# Patient Record
Sex: Male | Born: 1970
Health system: Southern US, Community
[De-identification: ages and names within clinical notes are randomized; demographics above are authoritative.]

## PROBLEM LIST (undated history)

## (undated) DIAGNOSIS — J45909 Unspecified asthma, uncomplicated: Secondary | ICD-10-CM

## (undated) DIAGNOSIS — E785 Hyperlipidemia, unspecified: Secondary | ICD-10-CM

## (undated) DIAGNOSIS — G43909 Migraine, unspecified, not intractable, without status migrainosus: Secondary | ICD-10-CM

## (undated) DIAGNOSIS — M1990 Unspecified osteoarthritis, unspecified site: Secondary | ICD-10-CM

## (undated) DIAGNOSIS — K602 Anal fissure, unspecified: Secondary | ICD-10-CM

## (undated) DIAGNOSIS — D6949 Other primary thrombocytopenia: Secondary | ICD-10-CM

## (undated) DIAGNOSIS — E079 Disorder of thyroid, unspecified: Secondary | ICD-10-CM

## (undated) HISTORY — DX: Hyperlipidemia, unspecified: E78.5

## (undated) HISTORY — DX: Disorder of thyroid, unspecified: E07.9

## (undated) HISTORY — DX: Migraine, unspecified, not intractable, without status migrainosus: G43.909

## (undated) HISTORY — DX: Unspecified osteoarthritis, unspecified site: M19.90

## (undated) HISTORY — DX: Unspecified asthma, uncomplicated: J45.909

## (undated) HISTORY — DX: Other primary thrombocytopenia: D69.49

## (undated) HISTORY — DX: Anal fissure, unspecified: K60.2

## (undated) HISTORY — PX: COLONOSCOPY: SHX174

---

## 2005-01-02 ENCOUNTER — Emergency Department (HOSPITAL_COMMUNITY): Admission: EM | Admit: 2005-01-02 | Discharge: 2005-01-02 | Payer: Self-pay

## 2010-05-10 ENCOUNTER — Ambulatory Visit: Payer: Self-pay | Admitting: Family Medicine

## 2010-05-10 DIAGNOSIS — E039 Hypothyroidism, unspecified: Secondary | ICD-10-CM

## 2010-05-10 DIAGNOSIS — M542 Cervicalgia: Secondary | ICD-10-CM | POA: Insufficient documentation

## 2010-05-24 ENCOUNTER — Ambulatory Visit: Payer: Self-pay | Admitting: Family Medicine

## 2010-05-24 LAB — CONVERTED CEMR LAB
Nitrite: NEGATIVE
Urobilinogen, UA: 0.2

## 2010-05-25 LAB — CONVERTED CEMR LAB
ALT: 45 units/L (ref 0–53)
Albumin: 4.2 g/dL (ref 3.5–5.2)
Alkaline Phosphatase: 72 units/L (ref 39–117)
Basophils Relative: 0.6 % (ref 0.0–3.0)
CO2: 31 meq/L (ref 19–32)
Calcium: 9.8 mg/dL (ref 8.4–10.5)
Chloride: 106 meq/L (ref 96–112)
Eosinophils Absolute: 0.1 10*3/uL (ref 0.0–0.7)
Hemoglobin: 16.1 g/dL (ref 13.0–17.0)
Lymphocytes Relative: 40.9 % (ref 12.0–46.0)
MCHC: 32.7 g/dL (ref 30.0–36.0)
MCV: 85.1 fL (ref 78.0–100.0)
Neutro Abs: 2.8 10*3/uL (ref 1.4–7.7)
RBC: 5.79 M/uL (ref 4.22–5.81)
Sodium: 143 meq/L (ref 135–145)
Total CHOL/HDL Ratio: 4
Total Protein: 7.2 g/dL (ref 6.0–8.3)

## 2010-05-31 ENCOUNTER — Ambulatory Visit: Payer: Self-pay | Admitting: Family Medicine

## 2010-05-31 DIAGNOSIS — R740 Nonspecific elevation of levels of transaminase and lactic acid dehydrogenase [LDH]: Secondary | ICD-10-CM

## 2010-05-31 DIAGNOSIS — D696 Thrombocytopenia, unspecified: Secondary | ICD-10-CM

## 2010-05-31 LAB — CONVERTED CEMR LAB: Cholesterol, target level: 200 mg/dL

## 2010-06-25 ENCOUNTER — Ambulatory Visit: Payer: Self-pay | Admitting: Family Medicine

## 2010-06-26 LAB — CONVERTED CEMR LAB
ALT: 27 units/L (ref 0–53)
Albumin: 4 g/dL (ref 3.5–5.2)
Basophils Relative: 0.3 % (ref 0.0–3.0)
Eosinophils Absolute: 0.1 10*3/uL (ref 0.0–0.7)
HCT: 44 % (ref 39.0–52.0)
Hemoglobin: 14.9 g/dL (ref 13.0–17.0)
Lymphs Abs: 1.8 10*3/uL (ref 0.7–4.0)
MCHC: 33.8 g/dL (ref 30.0–36.0)
MCV: 84.6 fL (ref 78.0–100.0)
Monocytes Absolute: 0.4 10*3/uL (ref 0.1–1.0)
Neutro Abs: 1.8 10*3/uL (ref 1.4–7.7)
Neutrophils Relative %: 43.5 % (ref 43.0–77.0)
RBC: 5.21 M/uL (ref 4.22–5.81)
Total Protein: 6.8 g/dL (ref 6.0–8.3)

## 2010-06-27 ENCOUNTER — Ambulatory Visit: Payer: Self-pay | Admitting: Oncology

## 2010-07-03 ENCOUNTER — Ambulatory Visit (HOSPITAL_COMMUNITY): Admission: RE | Admit: 2010-07-03 | Discharge: 2010-07-03 | Payer: Self-pay | Admitting: Oncology

## 2010-07-03 ENCOUNTER — Encounter: Payer: Self-pay | Admitting: Family Medicine

## 2010-07-03 LAB — TECHNOLOGIST REVIEW

## 2010-07-03 LAB — CBC WITH DIFFERENTIAL/PLATELET
Basophils Absolute: 0 10*3/uL (ref 0.0–0.1)
EOS%: 1.6 % (ref 0.0–7.0)
HGB: 16.4 g/dL (ref 13.0–17.1)
MCH: 27.9 pg (ref 27.2–33.4)
MCV: 82.1 fL (ref 79.3–98.0)
MONO%: 7.1 % (ref 0.0–14.0)
NEUT#: 2 10*3/uL (ref 1.5–6.5)
RBC: 5.88 10*6/uL — ABNORMAL HIGH (ref 4.20–5.82)
RDW: 13.7 % (ref 11.0–14.6)
lymph#: 1.4 10*3/uL (ref 0.9–3.3)
nRBC: 0 % (ref 0–0)

## 2010-07-03 LAB — CHCC SMEAR

## 2010-07-04 LAB — COMPREHENSIVE METABOLIC PANEL
ALT: 30 U/L (ref 0–53)
AST: 24 U/L (ref 0–37)
Alkaline Phosphatase: 69 U/L (ref 39–117)
CO2: 19 mEq/L (ref 19–32)
Creatinine, Ser: 1.14 mg/dL (ref 0.40–1.50)
Sodium: 140 mEq/L (ref 135–145)
Total Bilirubin: 0.4 mg/dL (ref 0.3–1.2)
Total Protein: 7.3 g/dL (ref 6.0–8.3)

## 2010-07-04 LAB — LACTATE DEHYDROGENASE: LDH: 115 U/L (ref 94–250)

## 2010-07-30 ENCOUNTER — Ambulatory Visit: Payer: Self-pay | Admitting: Oncology

## 2010-08-01 ENCOUNTER — Encounter: Payer: Self-pay | Admitting: Family Medicine

## 2010-08-01 LAB — CBC WITH DIFFERENTIAL/PLATELET
BASO%: 0.4 % (ref 0.0–2.0)
Eosinophils Absolute: 0.1 10*3/uL (ref 0.0–0.5)
MCHC: 33.9 g/dL (ref 32.0–36.0)
MONO#: 0.4 10*3/uL (ref 0.1–0.9)
NEUT#: 2.1 10*3/uL (ref 1.5–6.5)
RBC: 5.56 10*6/uL (ref 4.20–5.82)
RDW: 13.8 % (ref 11.0–14.6)
WBC: 4.6 10*3/uL (ref 4.0–10.3)
nRBC: 0 % (ref 0–0)

## 2010-08-01 LAB — CHCC SMEAR

## 2010-11-02 ENCOUNTER — Ambulatory Visit: Payer: Self-pay | Admitting: Oncology

## 2010-11-06 ENCOUNTER — Encounter: Payer: Self-pay | Admitting: Family Medicine

## 2010-11-06 LAB — CHCC SMEAR

## 2010-11-06 LAB — CBC WITH DIFFERENTIAL/PLATELET
BASO%: 0.2 % (ref 0.0–2.0)
Basophils Absolute: 0 10*3/uL (ref 0.0–0.1)
Eosinophils Absolute: 0.1 10*3/uL (ref 0.0–0.5)
HCT: 45.9 % (ref 38.4–49.9)
HGB: 15.5 g/dL (ref 13.0–17.1)
LYMPH%: 39.7 % (ref 14.0–49.0)
MCHC: 33.8 g/dL (ref 32.0–36.0)
MONO#: 0.4 10*3/uL (ref 0.1–0.9)
NEUT#: 2 10*3/uL (ref 1.5–6.5)
NEUT%: 48.2 % (ref 39.0–75.0)
Platelets: 57 10*3/uL — ABNORMAL LOW (ref 140–400)
WBC: 4.1 10*3/uL (ref 4.0–10.3)
lymph#: 1.6 10*3/uL (ref 0.9–3.3)

## 2010-11-08 LAB — HEMOGLOBINOPATHY EVALUATION
Hgb A2 Quant: 2.8 % (ref 2.2–3.2)
Hgb F Quant: 0 % (ref 0.0–2.0)
Hgb S Quant: 0 % (ref 0.0–0.0)

## 2010-12-24 ENCOUNTER — Ambulatory Visit: Payer: Self-pay | Admitting: Oncology

## 2011-01-08 NOTE — Letter (Signed)
Summary: [facility]  [facility]   Imported By: Maryln Gottron 08/23/2010 14:57:21  _____________________________________________________________________  External Attachment:    Type:   Image     Comment:   External Document

## 2011-01-08 NOTE — Assessment & Plan Note (Signed)
Summary: to be est/njr/pt rsc/cjr   Vital Signs:  Patient profile:   39 year old male Height:      69 inches Weight:      171 pounds BMI:     25.34 Temp:     97.7 degrees F oral Pulse rate:   60 / minute Pulse rhythm:   regular Resp:     12 per minute BP sitting:   130 / 82  (left arm) Cuff size:   regular  Vitals Entered By: Sid Falcon LPN (May 10, 1609 10:43 AM)  Nutrition Counseling: Patient's BMI is greater than 25 and therefore counseled on weight management options. CC: New to establish, left neck pain   History of Present Illness: New patient to establish care.    patient is from [place] and has lived here in [place] for approximately 11 years. Questionable history of hypothyroidism 2006 patient treated temporarily but has not had any blood work since then. Anal fissure 2002. Family history sickle trait.  No prior surgeries. No medications. No known drug allergies. Last tetanus over 10 years ago.  Family history signif for father hypertension and diabetes type 2. Brother sister with asthma.  Acute issue of left neck pain 2 weeks. Described as sore or achy quality moderate severity. Worse with movement. No associated arm pain, weakness, or numbness. Topical helped somewhat. Taking no medications.  Preventive Screening-Counseling & Management  Alcohol-Tobacco     Smoking Status: never  Allergies (verified): No Known Drug Allergies  Past History:  Family History: Last updated: 05/10/2010 Father, hypertension, diabetes Type ll Mother, sickle trait Sister, diabetes type ll, asthma Brother, asthmaa  Social History: Last updated: 05/10/2010 Occupation:  Location manager Married Never Smoked Alcohol use-yes one son  Risk Factors: Smoking Status: never (05/10/2010)  Past Medical History: Blood in stool Migraines HIV testing Thyroid problem PMH-FH-SH reviewed for relevance  Family History: Father, hypertension, diabetes Type ll Mother,  sickle trait Sister, diabetes type ll, asthma Brother, asthmaa  Social History: Occupation:  Location manager Married Never Smoked Alcohol use-yes one son Smoking Status:  never Occupation:  employed  Review of Systems  The patient denies anorexia, fever, weight loss, weight gain, vision loss, decreased hearing, chest pain, syncope, dyspnea on exertion, peripheral edema, prolonged cough, headaches, hemoptysis, abdominal pain, melena, hematochezia, severe indigestion/heartburn, incontinence, muscle weakness, and depression.         some chronic fatigue and rare episodes of constipation.  Physical Exam  General:  Well-developed,well-nourished,in no acute distress; alert,appropriate and cooperative throughout examination Head:  Normocephalic and atraumatic without obvious abnormalities. No apparent alopecia or balding. Eyes:  No corneal or conjunctival inflammation noted. EOMI. Perrla. Funduscopic exam benign, without hemorrhages, exudates or papilledema. Vision grossly normal. Mouth:  Oral mucosa and oropharynx without lesions or exudates.  Teeth in good repair. Neck:  No deformities, masses, or tenderness noted. Lungs:  Normal respiratory effort, chest expands symmetrically. Lungs are clear to auscultation, no crackles or wheezes. Heart:  Normal rate and regular rhythm. S1 and S2 normal without gallop, murmur, click, rub or other extra sounds. Msk:  full ROM neck but some L sided pain with neck flexion and lateral bend to L. Extremities:  No clubbing, cyanosis, edema, or deformity noted with normal full range of motion of all joints.   Neurologic:  alert & oriented X3, cranial nerves II-XII intact, strength normal in all extremities, sensation intact to light touch, and DTRs symmetrical and normal.     Impression & Recommendations:  Problem #  1:  NECK PAIN, LEFT (ICD-723.1) Assessment New nonfocal neuro exam.  hopefully all muscular.  Trial of NSAID and consider PT if no better in  2 weeks. His updated medication list for this problem includes:    Naproxen 500 Mg Tabs (Naproxen) ..... One by mouth two times a day with food as needed pain  Problem # 2:  HYPOTHYROIDISM (ICD-244.9) Reported hx of .  Schedule CPE and recheck labs then.  Problem # 3:  Preventive Health Care (ICD-V70.0) needs Tdap.  Schedule CPE.  Complete Medication List: 1)  Naproxen 500 Mg Tabs (Naproxen) .... One by mouth two times a day with food as needed pain  Other Orders: Tdap => 67yrs IM (16109) Admin 1st Vaccine (60454)  Patient Instructions: 1)  Schedule CPE.  2)  Continue topical balm and heat to neck. 3)  Follow up promptly if you notice any weakness or numbness of L arm. Prescriptions: NAPROXEN 500 MG TABS (NAPROXEN) one by mouth two times a day with food as needed pain  #60 x 0   Entered and Authorized by:   [name] MD   Signed by:   [name] MD on 05/10/2010   Method used:   Electronically to        [facility] [address] [id number] (retail)       [address] [place], [place]  [address]       Ph: [phone]       Fax: [phone]   RxID:   [id number]    Immunizations Administered:  Tetanus Vaccine:    Vaccine Type: Tdap    Site: left deltoid    Mfr: GlaxoSmithKline    Dose: 0.5 ml    Route: IM    Given by: Sid Falcon LPN    Exp. Date: 03/02/2012    Lot #: [id number]

## 2011-01-08 NOTE — Assessment & Plan Note (Signed)
Summary: cpx/mm   Vital Signs:  Patient profile:   40 year old male Height:      69 inches Weight:      170 pounds Temp:     98.2 degrees F oral Pulse rate:   64 / minute Pulse rhythm:   regular BP sitting:   100 / 60  (left arm) Cuff size:   regular  Vitals Entered By: Duard Brady LPN (May 31, 2010 8:57 AM) CC: cpx - doing well, Lipid Management Is Patient Diabetic? No   History of Present Illness: Here for CPE.  PMH,FH, AND SH reviewed-no changes. Takes no meds.  Tetanus last visit. Not exercising.  Lipid Management History:      Negative NCEP/ATP III risk factors include male age less than 62 years old, non-tobacco-user status, non-hypertensive, no ASHD (atherosclerotic heart disease), no prior stroke/TIA, no peripheral vascular disease, and no history of aortic aneurysm.     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 Alcohol use-yes one son  Risk Factors: Smoking Status: never (05/31/2010)  Past Medical History: Blood in stool ?fissure 2003 Migraines ?Thyroid problem  Past Surgical History: none PMH-FH-SH reviewed for relevance  Family History: Father, hypertension, diabetes Type ll, asthma Mother, sickle trait Sister, diabetes type ll, asthma Brother, asthma  Social History: 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         still has some L upper back pain.  Physical Exam  General:  Well-developed,well-nourished,in no acute distress; alert,appropriate and cooperative throughout examination Head:  Normocephalic and atraumatic without obvious abnormalities. No apparent alopecia or balding. Eyes:  No corneal or conjunctival inflammation noted. EOMI. Perrla. Funduscopic exam benign, without hemorrhages, exudates or papilledema. Vision grossly normal. Ears:  External ear exam shows no significant lesions or deformities.  Otoscopic examination reveals clear canals, tympanic membranes are intact bilaterally without bulging, retraction, inflammation or discharge. Hearing is grossly normal bilaterally. Mouth:  Oral mucosa and oropharynx without lesions or exudates.  Teeth in good repair. Neck:  No deformities, masses, or tenderness noted. Lungs:  Normal respiratory effort, chest expands symmetrically. Lungs are clear to auscultation, no crackles or wheezes. Heart:  Normal rate and regular rhythm. S1 and S2 normal without gallop, murmur, click, rub or other extra sounds. Abdomen:  Bowel sounds positive,abdomen soft and non-tender without masses, organomegaly or hernias noted. Genitalia:  Testes bilaterally descended without nodularity, tenderness or masses. No scrotal masses or lesions. No penis lesions or urethral discharge. Msk:  No deformity or scoliosis noted of thoracic or lumbar spine.   Extremities:  No clubbing, cyanosis, edema, or deformity noted with normal full range of motion of all joints.   Neurologic:  No cranial nerve deficits noted. Station and gait are normal. Plantar reflexes  are down-going bilaterally. DTRs are symmetrical throughout. Sensory,  motor and coordinative functions appear intact. Skin:  Intact without suspicious lesions or rashes Cervical Nodes:  No lymphadenopathy noted Psych:  normally interactive, good eye contact, not anxious appearing, and not depressed appearing.     Impression & Recommendations:  Problem # 1:  Preventive Health Care (ICD-V70.0) labs reviewed with pt.  Low Plts and mildly elevated AST of uncertain signif.  Pt uses minimal ETOH. No stigmata of liver disease.  Will repeat those in about one month.  Problem # 2:  THROMBOCYTOPENIA (ICD-287.5) as above.  Repeat labs scheduled.  Problem # 3:  TRANSAMINASES, SERUM, ELEVATED (ICD-790.4) Assessment: New ?etiology.  If remain up with repeat, check hepatitis studies.  Complete Medication List: 1)  Naproxen 500 Mg Tabs (Naproxen) .... One by mouth two times a day with food as needed pain  Lipid Assessment/Plan:      Based on NCEP/ATP III, the patient's risk factor category is "0-1 risk factors".  The patient's lipid goals are as follows: Total cholesterol goal is 200; LDL cholesterol goal is 160; HDL cholesterol goal is 40; Triglyceride goal is 150.    Patient Instructions: 1)  It is important that you exercise reguarly at least 20 minutes 5 times a week. If you develop chest pain, have severe difficulty breathing, or feel very tired, stop exercising immediately and seek medical attention.  2)  Schedule the following labs in about one month: 3)  CBC  287.5 4)  Hepatic panel  790.4

## 2011-01-08 NOTE — Letter (Signed)
[facility]   Imported By: Maryln Gottron 07/19/2010 13:01:07  _____________________________________________________________________  External Attachment:    Type:   Image     Comment:   External Document

## 2011-01-08 NOTE — Letter (Signed)
Summary: [facility]  [facility]   Imported By: Maryln Gottron 11/09/2010 14:05:56  _____________________________________________________________________  External Attachment:    Type:   Image     Comment:   External Document

## 2011-01-31 ENCOUNTER — Other Ambulatory Visit: Payer: Self-pay | Admitting: Oncology

## 2011-01-31 ENCOUNTER — Encounter (HOSPITAL_BASED_OUTPATIENT_CLINIC_OR_DEPARTMENT_OTHER): Payer: BC Managed Care – PPO | Admitting: Oncology

## 2011-01-31 DIAGNOSIS — D72821 Monocytosis (symptomatic): Secondary | ICD-10-CM

## 2011-01-31 DIAGNOSIS — D696 Thrombocytopenia, unspecified: Secondary | ICD-10-CM

## 2011-01-31 LAB — CBC WITH DIFFERENTIAL/PLATELET
BASO%: 0.2 % (ref 0.0–2.0)
EOS%: 4.6 % (ref 0.0–7.0)
HCT: 47.9 % (ref 38.4–49.9)
LYMPH%: 25.5 % (ref 14.0–49.0)
MCH: 27.6 pg (ref 27.2–33.4)
MCHC: 33.8 g/dL (ref 32.0–36.0)
NEUT%: 53.3 % (ref 39.0–75.0)
RBC: 5.87 10*6/uL — ABNORMAL HIGH (ref 4.20–5.82)
WBC: 5.6 10*3/uL (ref 4.0–10.3)
lymph#: 1.4 10*3/uL (ref 0.9–3.3)
nRBC: 0 % (ref 0–0)

## 2011-01-31 LAB — CHCC SMEAR

## 2011-03-27 ENCOUNTER — Ambulatory Visit (INDEPENDENT_AMBULATORY_CARE_PROVIDER_SITE_OTHER): Payer: BC Managed Care – PPO | Admitting: Family Medicine

## 2011-03-27 ENCOUNTER — Encounter: Payer: Self-pay | Admitting: Family Medicine

## 2011-03-27 DIAGNOSIS — H9209 Otalgia, unspecified ear: Secondary | ICD-10-CM

## 2011-03-27 NOTE — Progress Notes (Signed)
  Subjective:    Patient ID: Joseph Lindsey, male    DOB: [birth year], 40 y.o.   MRN: [id number]  HPI Patient seen with left ear pain which started 2-3 days ago. Also had some mild sore throat. Mild headache which is now better. Symptoms are actually improved today. He has no nasal congestion or cough. No fever or chills. No hearing changes. Denies vertigo.   Review of Systems  Constitutional: Negative for fever, chills and fatigue.  HENT: Positive for ear pain and sore throat. Negative for hearing loss, trouble swallowing and tinnitus.   Respiratory: Negative for cough and shortness of breath.   Hematological: Negative for adenopathy.       Objective:   Physical Exam  Constitutional: He appears well-developed and well-nourished.  HENT:  Right Ear: External ear normal.  Left Ear: External ear normal.  Nose: Nose normal.  Mouth/Throat: Oropharynx is clear and moist. No oropharyngeal exudate.  Neck: Neck supple.  Cardiovascular: Normal rate and regular rhythm.   Pulmonary/Chest: Effort normal and breath sounds normal. He has no wheezes. He has no rales.  Lymphadenopathy:    He has no cervical adenopathy.          Assessment & Plan:  Left otalgia with normal exam. Possibly referred from sore throat. Symptoms improved today. Treat symptomatically with Advil or Aleve. Followup when necessary

## 2011-03-27 NOTE — Patient Instructions (Signed)
Follow up promptly for any fever or other problems.

## 2011-05-30 ENCOUNTER — Other Ambulatory Visit: Payer: Self-pay | Admitting: Oncology

## 2011-05-30 ENCOUNTER — Encounter (HOSPITAL_BASED_OUTPATIENT_CLINIC_OR_DEPARTMENT_OTHER): Payer: BC Managed Care – PPO | Admitting: Oncology

## 2011-05-30 DIAGNOSIS — D696 Thrombocytopenia, unspecified: Secondary | ICD-10-CM

## 2011-05-30 LAB — CBC WITH DIFFERENTIAL/PLATELET
BASO%: 0.3 % (ref 0.0–2.0)
EOS%: 1.3 % (ref 0.0–7.0)
LYMPH%: 33.8 % (ref 14.0–49.0)
MCHC: 34.1 g/dL (ref 32.0–36.0)
MCV: 80.9 fL (ref 79.3–98.0)
MONO%: 9 % (ref 0.0–14.0)
Platelets: 66 10*3/uL — ABNORMAL LOW (ref 140–400)
RBC: 5.59 10*6/uL (ref 4.20–5.82)
RDW: 13.6 % (ref 11.0–14.6)
nRBC: 0 % (ref 0–0)

## 2011-08-29 ENCOUNTER — Ambulatory Visit (INDEPENDENT_AMBULATORY_CARE_PROVIDER_SITE_OTHER): Payer: BC Managed Care – PPO | Admitting: Family Medicine

## 2011-08-29 ENCOUNTER — Encounter: Payer: Self-pay | Admitting: Family Medicine

## 2011-08-29 VITALS — BP 110/78 | Temp 98.3°F | Wt 174.0 lb

## 2011-08-29 DIAGNOSIS — D696 Thrombocytopenia, unspecified: Secondary | ICD-10-CM

## 2011-08-29 DIAGNOSIS — M79609 Pain in unspecified limb: Secondary | ICD-10-CM

## 2011-08-29 DIAGNOSIS — M25519 Pain in unspecified shoulder: Secondary | ICD-10-CM

## 2011-08-29 DIAGNOSIS — M79603 Pain in arm, unspecified: Secondary | ICD-10-CM

## 2011-08-29 NOTE — Patient Instructions (Signed)
Follow up immediately for any chest pains or shortness of breath. We will call you regarding stress test.

## 2011-08-29 NOTE — Progress Notes (Signed)
  Subjective:    Patient ID: Joseph Lindsey, male    DOB: [birth year], 40 y.o.   MRN: [id number]  HPI Left arm pain. Somewhat progressive over several months. Now almost daily but episodic. Episodes usually last 30-40 minutes. Possibly worse after activity such as climbing stairs. Denies any chest pains. No dyspnea. No history of injury. Location is left biceps region. Nontender to palpation. No numbness or weakness. No alleviating factors but has not taken any medications.  Achy quality.  He does recall episode several months ago where he was at a computer falling asleep and hyperextended his neck. He's not sure if symptoms started then. He has not noted any edema. No significant neck pain.  Chronic history is significant for chronic thrombocytopenia which has been stable and followed by hematology  Patient is nonsmoker. No history of diabetes or hypertension. No known family history of premature CAD.    Review of Systems  Constitutional: Negative for fever, chills, appetite change and unexpected weight change.  HENT: Negative for neck pain.   Respiratory: Negative for cough and shortness of breath.   Cardiovascular: Negative for chest pain, palpitations and leg swelling.  Musculoskeletal: Negative for back pain, joint swelling and arthralgias.  Skin: Negative for rash.  Neurological: Negative for weakness and numbness.  Hematological: Negative for adenopathy.       Objective:   Physical Exam  Constitutional: He appears well-developed and well-nourished. No distress.  Neck: Neck supple. No thyromegaly present.  Cardiovascular: Normal rate, regular rhythm and normal heart sounds.   Pulmonary/Chest: Effort normal and breath sounds normal. No respiratory distress. He has no wheezes. He has no rales.  Musculoskeletal:       No muscle atrophy. Full range of motion left shoulder and all joints of upper extremity. No reproducible tenderness left biceps  Lymphadenopathy:    He has no  cervical adenopathy.  Neurological:       Full strength upper extremities. Normal sensory function. . Symmetric reflexes.  Skin: No rash noted.  Psychiatric: He has a normal mood and affect. His behavior is normal.          Assessment & Plan:  Episodic left arm pain. This is not clearly musculoskeletal and not reproducible. Question of cervical nerve root impingement but symptoms certainly not classic. Symptoms are more episodic. Doubt cardiac as he has relatively low risk factors but given possible association with things like climbing stairs needs further evaluation.  EKG today no acute findings.

## 2011-08-30 NOTE — Progress Notes (Signed)
Addended by: Kristian Covey on: 08/30/2011 08:43 AM   Modules accepted: Orders

## 2011-09-11 ENCOUNTER — Ambulatory Visit (HOSPITAL_COMMUNITY): Payer: BC Managed Care – PPO | Attending: Family Medicine | Admitting: Radiology

## 2011-09-11 VITALS — Ht 70.0 in | Wt 174.0 lb

## 2011-09-11 DIAGNOSIS — M79609 Pain in unspecified limb: Secondary | ICD-10-CM | POA: Insufficient documentation

## 2011-09-11 DIAGNOSIS — R9431 Abnormal electrocardiogram [ECG] [EKG]: Secondary | ICD-10-CM

## 2011-09-11 DIAGNOSIS — R0789 Other chest pain: Secondary | ICD-10-CM

## 2011-09-11 DIAGNOSIS — M79603 Pain in arm, unspecified: Secondary | ICD-10-CM

## 2011-09-11 MED ORDER — TECHNETIUM TC 99M TETROFOSMIN IV KIT
11.0000 | PACK | Freq: Once | INTRAVENOUS | Status: AC | PRN
  Administered 2011-09-11: 11 via INTRAVENOUS

## 2011-09-11 MED ORDER — TECHNETIUM TC 99M TETROFOSMIN IV KIT
33.0000 | PACK | Freq: Once | INTRAVENOUS | Status: AC | PRN
  Administered 2011-09-11: 33 via INTRAVENOUS

## 2011-09-11 NOTE — Progress Notes (Signed)
[facility] 3 NUCLEAR MED [address] [place] [address] [phone]  Cardiology Nuclear Med Study  Joseph Lindsey is a 40 y.o. male [id number] [birth year]   Nuclear Med Background Indication for Stress Test:  Evaluation for Ischemia and Abnormal EKG History:  No previous documented CAD and Abnormal EKG Cardiac Risk Factors: Lipids  Symptoms:  (L) arm pain progressive x several months almost daily now lasting 30-40 minutes.  It's worse after activity   Nuclear Pre-Procedure Caffeine/Decaff Intake:  Tea @ 9:45 AM NPO After: 11:30pm   Lungs:  clear IV 0.9% NS with Angio Cath:  22g  IV Site: R Wrist  IV Started by:  Irean Hong, RN  Chest Size (in):  44 Cup Size: n/a  Height: 5\' 10"  (1.778 m)  Weight:  174 lb (78.926 kg)  BMI:  Body mass index is 24.97 kg/(m^2). Tech Comments:  N/A    Nuclear Med Study 1 or 2 day study: 1 day  Stress Test Type:  Stress  Reading MD: Kristeen Miss, MD  Order Authorizing Provider:  Dr. Percival Spanish  Resting Radionuclide: Technetium 10m Tetrofosmin  Resting Radionuclide Dose: 11 mCi   Stress Radionuclide:  Technetium 24m Tetrofosmin  Stress Radionuclide Dose: 33 mCi           Stress Protocol Rest HR: 49 Stress HR: 173  Rest BP: 115/76 Stress BP: 217/62  Exercise Time (min): 10:00 METS: 11.70   Predicted Max HR: 181 bpm % Max HR: 95.58 bpm Rate Pressure Product: 44010   Dose of Adenosine (mg):  n/a Dose of Lexiscan: n/a mg  Dose of Atropine (mg): n/a Dose of Dobutamine: n/a mcg/kg/min (at max HR)  Stress Test Technologist: Milana Na, EMT-P  Nuclear Technologist:  Domenic Polite, CNMT     Rest Procedure:  Myocardial perfusion imaging was performed at rest 45 minutes following the intravenous administration of Technetium 66m Tetrofosmin. Rest ECG: Sinus Bradycardia  Stress Procedure:  The patient exercised for 10:00.  The patient stopped due to fatigue and chest pain.  There were non specific ST-T  wave changes.  Technetium 19m Tetrofosmin was injected at peak exercise and myocardial perfusion imaging was performed after a brief delay. Stress ECG: No significant change from baseline ECG  QPS Raw Data Images:  Normal; no motion artifact; normal heart/lung ratio. Stress Images:  Normal homogeneous uptake in all areas of the myocardium. Rest Images:  Normal homogeneous uptake in all areas of the myocardium. Subtraction (SDS):  Normal Transient Ischemic Dilatation (Normal <1.22):  .95 Lung/Heart Ratio (Normal <0.45):  .27  Quantitative Gated Spect Images QGS EDV:  101 ml QGS ESV:  42 ml QGS cine images:  NL LV Function; NL Wall Motion QGS EF: 59%  Impression Exercise Capacity:  Excellent exercise capacity. BP Response:  Hypertensive blood pressure response. Clinical Symptoms:  No chest pain. ECG Impression:  No significant ST segment change suggestive of ischemia. Comparison with Prior Nuclear Study: No images to compare  Overall Impression:  Normal stress nuclear study.  No evidence of ischemia.  Normal LV function with an Ef of 59%.    [name], Montez Hageman., MD, [facility]

## 2011-09-13 NOTE — Progress Notes (Signed)
Quick Note:  Pt informed ______ 

## 2011-10-09 ENCOUNTER — Ambulatory Visit (INDEPENDENT_AMBULATORY_CARE_PROVIDER_SITE_OTHER): Payer: BC Managed Care – PPO | Admitting: Family Medicine

## 2011-10-09 ENCOUNTER — Encounter: Payer: Self-pay | Admitting: Family Medicine

## 2011-10-09 VITALS — BP 118/70 | HR 85 | Temp 98.6°F | Wt 173.0 lb

## 2011-10-09 DIAGNOSIS — J40 Bronchitis, not specified as acute or chronic: Secondary | ICD-10-CM

## 2011-10-09 DIAGNOSIS — R21 Rash and other nonspecific skin eruption: Secondary | ICD-10-CM

## 2011-10-09 MED ORDER — HYDROCODONE-HOMATROPINE 5-1.5 MG/5ML PO SYRP: 5.0000 mL | ORAL_SOLUTION | ORAL | Status: AC | PRN

## 2011-10-09 MED ORDER — AZITHROMYCIN 250 MG PO TABS: ORAL_TABLET | ORAL | Status: AC

## 2011-10-09 NOTE — Progress Notes (Signed)
  Subjective:    Patient ID: Joseph Lindsey, male    DOB: [birth year], 40 y.o.   MRN: [id number]  HPI Here for one week of chest congestion, coughing up yellow sputum, and ST. No fever. On Mucinex. Also he has had a rash on the back for the past 6 months. Sometimes it itches, sometimes not.    Review of Systems  Constitutional: Negative.   HENT: Negative.   Eyes: Negative.   Respiratory: Positive for cough.        Objective:   Physical Exam  Constitutional: He appears well-developed and well-nourished.  HENT:  Right Ear: External ear normal.  Left Ear: External ear normal.  Nose: Nose normal.  Mouth/Throat: Oropharynx is clear and moist. No oropharyngeal exudate.  Eyes: Conjunctivae are normal. Pupils are equal, round, and reactive to light.  Neck: No thyromegaly present.  Pulmonary/Chest: Effort normal and breath sounds normal.  Lymphadenopathy:    He has no cervical adenopathy.  Skin:       Patchy scaly slightly raised areas on the back           Assessment & Plan:  Refer to dermatology.

## 2011-11-08 ENCOUNTER — Encounter: Payer: Self-pay | Admitting: *Deleted

## 2011-11-19 ENCOUNTER — Ambulatory Visit (INDEPENDENT_AMBULATORY_CARE_PROVIDER_SITE_OTHER): Payer: BC Managed Care – PPO | Admitting: Family Medicine

## 2011-11-19 ENCOUNTER — Encounter: Payer: Self-pay | Admitting: Family Medicine

## 2011-11-19 VITALS — BP 100/72 | HR 82 | Temp 98.3°F | Wt 174.0 lb

## 2011-11-19 DIAGNOSIS — J069 Acute upper respiratory infection, unspecified: Secondary | ICD-10-CM

## 2011-11-19 MED ORDER — FEXOFENADINE-PSEUDOEPHED ER 180-240 MG PO TB24: 1.0000 | ORAL_TABLET | Freq: Every day | ORAL | Status: AC

## 2011-11-19 MED ORDER — GUAIFENESIN-CODEINE 100-10 MG/5ML PO SYRP: 5.0000 mL | ORAL_SOLUTION | Freq: Two times a day (BID) | ORAL | Status: AC | PRN

## 2011-11-19 NOTE — Progress Notes (Signed)
  Subjective:    Patient ID: Joseph Lindsey, male    DOB: [birth year], 40 y.o.   MRN: [id number]  HPI Joseph Lindsey is a 40 year old male nonsmoker in with complaints of headaches fever at night chills body aches, fatigue, sore throat, cough, nasal congestion, for 2 days his intake and [place] and Mucinex over-the-counter with no relief. Denies any chest pain, shortness of breath, or edema.   Review of Systems Review of systems as stated above Past Medical History  Diagnosis Date  . Fissure, anal     2003  . Migraines   . Primary thrombocytopenia, unspecified     History   Social History  . Marital Status: Married    Spouse Name: N/A    Number of Children: N/A  . Years of Education: N/A   Occupational History  . Not on file.   Social History Main Topics  . Smoking status: Never Smoker   . Smokeless tobacco: Never Used  . Alcohol Use: Yes  . Drug Use: No  . Sexually Active: Not on file   Other Topics Concern  . Not on file   Social History Narrative  . No narrative on file    No past surgical history on file.  Family History  Problem Relation Age of Onset  . Hypertension Father   . Diabetes Father   . Asthma Father   . Diabetes Sister   . Asthma Sister   . Asthma Brother   . Sickle cell trait      mother    No Known Allergies  Current Outpatient Prescriptions on File Prior to Visit  Medication Sig Dispense Refill  . naproxen (NAPROSYN) 500 MG tablet Take 500 mg by mouth 2 (two) times daily with a meal.          BP 100/72  Pulse 82  Temp(Src) 98.3 F (36.8 C) (Oral)  Wt 174 lb (78.926 kg)chart     Objective:   Physical Exam Blood pressure recheck: 128/70. Alert and oriented in no acute distress. HEENT: Pupils equal and reactive ears are clear bilaterally, pharynx moderately redden, no sinus pressure or tenderness to palpation. Neck: No lymphadenopathy. Lungs clear to auscultation. Cardiac: Regular rate and rhythm no murmurs rubs or gallops. Skin: Warm  and dry. No cyanosis.  Influenza obtained:       Assessment & Plan:  Assessment: Upper respiratory infection  Plan: Over-the-counter symptomatic treatment for relief. Rest. Drink plenty of fluids. Call if symptoms worsen or persist.

## 2011-11-19 NOTE — Patient Instructions (Addendum)
Over-the-counter symptomatic treatment for relief. Rest. Drink plenty of fluids. Call if symptoms worsen or persist.  Upper Respiratory Infection, Adult An upper respiratory infection (URI) is also known as the common cold. It is often caused by a type of germ (virus). Colds are easily spread (contagious). You can pass it to others by kissing, coughing, sneezing, or drinking out of the same glass. Usually, you get better in 1 or 2 weeks.  HOME CARE   Only take medicine as told by your doctor.   Use a warm mist humidifier or breathe in steam from a hot shower.   Drink enough water and fluids to keep your pee (urine) clear or pale yellow.   Get plenty of rest.   Return to work when your temperature is back to normal or as told by your doctor. You may use a face mask and wash your hands to stop your cold from spreading.  GET HELP RIGHT AWAY IF:   After the first few days, you feel you are getting worse.   You have questions about your medicine.   You have chills, shortness of breath, or brown or red spit (mucus).   You have yellow or brown snot (nasal discharge) or pain in the face, especially when you bend forward.   You have a fever, puffy (swollen) neck, pain when you swallow, or white spots in the back of your throat.   You have a bad headache, ear pain, sinus pain, or chest pain.   You have a high-pitched whistling sound when you breathe in and out (wheezing).   You have a lasting cough or cough up blood.   You have sore muscles or a stiff neck.  MAKE SURE YOU:   Understand these instructions.   Will watch your condition.   Will get help right away if you are not doing well or get worse.  Document Released: 05/13/2008 Document Revised: 08/07/2011 Document Reviewed: 04/01/2011 [facility] Patient Information 2012 [place], [place].

## 2011-11-21 ENCOUNTER — Other Ambulatory Visit: Payer: Self-pay | Admitting: Oncology

## 2011-11-21 ENCOUNTER — Other Ambulatory Visit (HOSPITAL_BASED_OUTPATIENT_CLINIC_OR_DEPARTMENT_OTHER): Payer: BC Managed Care – PPO | Admitting: Lab

## 2011-11-21 ENCOUNTER — Ambulatory Visit (HOSPITAL_BASED_OUTPATIENT_CLINIC_OR_DEPARTMENT_OTHER): Payer: BC Managed Care – PPO | Admitting: Oncology

## 2011-11-21 ENCOUNTER — Telehealth: Payer: Self-pay | Admitting: Oncology

## 2011-11-21 VITALS — BP 123/73 | HR 73 | Temp 96.9°F | Ht 69.0 in | Wt 171.9 lb

## 2011-11-21 DIAGNOSIS — D696 Thrombocytopenia, unspecified: Secondary | ICD-10-CM

## 2011-11-21 DIAGNOSIS — D72821 Monocytosis (symptomatic): Secondary | ICD-10-CM

## 2011-11-21 LAB — CBC WITH DIFFERENTIAL/PLATELET
BASO%: 0.5 % (ref 0.0–2.0)
EOS%: 1.6 % (ref 0.0–7.0)
Eosinophils Absolute: 0.1 10*3/uL (ref 0.0–0.5)
LYMPH%: 24 % (ref 14.0–49.0)
MCH: 27.5 pg (ref 27.2–33.4)
MCHC: 33.8 g/dL (ref 32.0–36.0)
MCV: 81.6 fL (ref 79.3–98.0)
MONO%: 13.5 % (ref 0.0–14.0)
Platelets: 63 10*3/uL — ABNORMAL LOW (ref 140–400)
RBC: 5.59 10*6/uL (ref 4.20–5.82)
nRBC: 0 % (ref 0–0)

## 2011-11-21 NOTE — Telephone Encounter (Signed)
gve the pt his June 2013 appt calendar °

## 2011-11-21 NOTE — Progress Notes (Signed)
Hematology and Oncology Follow Up Visit  Joseph Lindsey [id number] 1971/02/20 40 y.o. 11/21/2011 3:40 PM  CC: [name], M.D.    Principle Diagnosis: This is a 40 year old gentleman with thrombocytopenia etiologies  Including autoimmune thrombocytopenia such as idiopathic thrombocytopenic purpura     Current therapy: Observation and  surveillance.  Interim History:   [name] is a pleasant gentleman from [place] we have been following since July 2011 with platelet count between 50,000 and 60,000.  His workup has been really unrevealing at this point.  His abdominal ultrasound did not really show any evidence of any splenomegaly.  He did not report really any active bleeding.  Did not report any hematochezia.  Did not report any melena, did not report any epistaxis.  Did report some occasional easy bruisability.  He did not report any chest pain or difficulty breathing.  He had not reported any major changes in his performance status or activity level.  Medications: I have reviewed the patient's current medications. Current outpatient prescriptions:fexofenadine-pseudoephedrine (ALLEGRA-D 24) 180-240 MG per 24 hr tablet, Take 1 tablet by mouth daily., Disp: 30 tablet, Rfl: 2;  guaiFENesin-codeine (ROBITUSSIN AC) 100-10 MG/5ML syrup, Take 5 mLs by mouth 2 (two) times daily as needed for cough., Disp: 120 mL, Rfl: 0;  naproxen (NAPROSYN) 500 MG tablet, Take 500 mg by mouth 2 (two) times daily with a meal.  , Disp: , Rfl:   Allergies: No Known Allergies  Past Medical History, Surgical history, Social history, and Family History were reviewed and updated.  Review of Systems: Constitutional:  Negative for fever, chills, night sweats, anorexia, weight loss, pain. Cardiovascular: no chest pain or dyspnea on exertion Respiratory: no cough, shortness of breath, or wheezing Neurological: no TIA or stroke symptoms Dermatological: negative ENT: negative Skin:  Negative. Gastrointestinal: no abdominal pain, change in bowel habits, or black or bloody stools Genito-Urinary: no dysuria, trouble voiding, or hematuria Hematological and Lymphatic: negative Breast: negative Musculoskeletal: negative Remaining ROS negative. Physical Exam: Blood pressure 123/73, pulse 73, temperature 96.9 F (36.1 C), height 5\' 9"  (1.753 m), weight 171 lb 14.4 oz (77.973 kg). ECOG: 0 General appearance: alert Head: Normocephalic, without obvious abnormality, atraumatic Neck: no adenopathy, no carotid bruit, no JVD, supple, symmetrical, trachea midline and thyroid not enlarged, symmetric, no tenderness/mass/nodules Lymph nodes: Cervical, supraclavicular, and axillary nodes normal. Heart:regular rate and rhythm, S1, S2 normal, no murmur, click, rub or gallop Lung:chest clear, no wheezing, rales, normal symmetric air entry Abdomin: soft, non-tender, without masses or organomegaly EXT:no erythema, induration, or nodules   Lab Results: Lab Results  Component Value Date   WBC 3.8* 11/21/2011   HGB 15.4 11/21/2011   HCT 45.6 11/21/2011   MCV 81.6 11/21/2011   PLT 63* 11/21/2011     Chemistry      Component Value Date/Time   NA 140 07/03/2010 1330   K 4.0 07/03/2010 1330   CL 107 07/03/2010 1330   CO2 19 07/03/2010 1330   BUN 13 07/03/2010 1330   CREATININE 1.14 07/03/2010 1330      Component Value Date/Time   CALCIUM 9.3 07/03/2010 1330   ALKPHOS 69 07/03/2010 1330   AST 24 07/03/2010 1330   ALT 30 07/03/2010 1330   BILITOT 0.4 07/03/2010 1330        Impression and Plan: This is a pleasant, 40 year old gentleman with the following issues:  1. Thrombocytopenia.  Again, the differential diagnosis including autoimmune versus medication, versus a platelet function abnormality such as Bernard-Soulier or May-Hegglin  anomaly.  Again, his peripheral smear showed rather giant platelets at this point.  His platelet count is actually better today.  It is up to 63,000.  He  has really no bleeding.  So we are going to hold off on any steroid or IVIG intervention unless he is bleeding or having surgical procedure.   2. Followup will be in 6 months' time.   Eli Hose, MD 12/13/20123:40 PM

## 2011-12-24 ENCOUNTER — Ambulatory Visit (INDEPENDENT_AMBULATORY_CARE_PROVIDER_SITE_OTHER): Payer: BC Managed Care – PPO | Admitting: Family Medicine

## 2011-12-24 DIAGNOSIS — Z23 Encounter for immunization: Secondary | ICD-10-CM

## 2012-01-27 ENCOUNTER — Encounter: Payer: Self-pay | Admitting: Family Medicine

## 2012-01-27 ENCOUNTER — Ambulatory Visit (INDEPENDENT_AMBULATORY_CARE_PROVIDER_SITE_OTHER): Payer: BC Managed Care – PPO | Admitting: Family Medicine

## 2012-01-27 DIAGNOSIS — J029 Acute pharyngitis, unspecified: Secondary | ICD-10-CM

## 2012-01-27 MED ORDER — LIDOCAINE VISCOUS 2 % MT SOLN: 20.0000 mL | OROMUCOSAL | Status: AC | PRN

## 2012-01-27 NOTE — Progress Notes (Signed)
  Subjective:    Patient ID: Joseph Lindsey, male    DOB: 1971/03/22, 41 y.o.   MRN: [id number]  HPI  3 history of sore throat. Cough productive of yellow sputum. Postnasal drainage symptoms. Chills but no fever. Ibuprofen for sore throat with mild relief.  One of his children with similar symptoms.  Review of Systems  Constitutional: Positive for chills. Negative for fever.  HENT: Positive for sore throat. Negative for trouble swallowing.   Respiratory: Positive for cough.        Objective:   Physical Exam  Constitutional: He appears well-developed and well-nourished.  HENT:  Right Ear: External ear normal.  Left Ear: External ear normal.       Mild erythema. No exudate  Neck: Neck supple.  Pulmonary/Chest: Effort normal and breath sounds normal. No respiratory distress. He has no wheezes. He has no rales.  Lymphadenopathy:    He has no cervical adenopathy.          Assessment & Plan:  Pharyngitis. Suspect viral. Rapid strep negative. Viscous Xylocaine for symptom relief. Continue ibuprofen. Followup as needed

## 2012-01-27 NOTE — Patient Instructions (Signed)

## 2012-04-23 ENCOUNTER — Other Ambulatory Visit (INDEPENDENT_AMBULATORY_CARE_PROVIDER_SITE_OTHER): Payer: BC Managed Care – PPO

## 2012-04-23 DIAGNOSIS — Z0000 Encounter for general adult medical examination without abnormal findings: Secondary | ICD-10-CM

## 2012-04-23 LAB — CBC WITH DIFFERENTIAL/PLATELET
Basophils Absolute: 0 10*3/uL (ref 0.0–0.1)
HCT: 48.5 % (ref 39.0–52.0)
Lymphs Abs: 1.8 10*3/uL (ref 0.7–4.0)
Monocytes Absolute: 0.4 10*3/uL (ref 0.1–1.0)
Monocytes Relative: 9 % (ref 3.0–12.0)
Platelets: 64 10*3/uL — ABNORMAL LOW (ref 150.0–400.0)
RDW: 14.1 % (ref 11.5–14.6)

## 2012-04-23 LAB — HEPATIC FUNCTION PANEL
AST: 24 U/L (ref 0–37)
Total Bilirubin: 0.6 mg/dL (ref 0.3–1.2)

## 2012-04-23 LAB — POCT URINALYSIS DIPSTICK
Bilirubin, UA: NEGATIVE
Blood, UA: NEGATIVE
Glucose, UA: NEGATIVE
Ketones, UA: NEGATIVE
Spec Grav, UA: 1.015
Urobilinogen, UA: 0.2

## 2012-04-23 LAB — BASIC METABOLIC PANEL
BUN: 9 mg/dL (ref 6–23)
GFR: 78.61 mL/min (ref >60.00)
Glucose, Bld: 92 mg/dL (ref 70–99)
Potassium: 4.5 mEq/L (ref 3.5–5.1)

## 2012-04-23 LAB — TSH: TSH: 6.28 u[IU]/mL — ABNORMAL HIGH (ref 0.35–5.50)

## 2012-04-23 LAB — PSA: PSA: 0.36 ng/mL (ref 0.10–4.00)

## 2012-04-23 LAB — LIPID PANEL: Cholesterol: 251 mg/dL — ABNORMAL HIGH (ref 0–200)

## 2012-04-23 LAB — LDL CHOLESTEROL, DIRECT: Direct LDL: 186.7 mg/dL

## 2012-04-30 ENCOUNTER — Encounter: Payer: Self-pay | Admitting: Family Medicine

## 2012-04-30 ENCOUNTER — Ambulatory Visit (INDEPENDENT_AMBULATORY_CARE_PROVIDER_SITE_OTHER): Payer: BC Managed Care – PPO | Admitting: Family Medicine

## 2012-04-30 VITALS — BP 130/68 | HR 80 | Temp 98.0°F | Resp 12 | Ht 69.0 in | Wt 176.0 lb

## 2012-04-30 DIAGNOSIS — E039 Hypothyroidism, unspecified: Secondary | ICD-10-CM

## 2012-04-30 DIAGNOSIS — D696 Thrombocytopenia, unspecified: Secondary | ICD-10-CM

## 2012-04-30 MED ORDER — LEVOTHYROXINE SODIUM 25 MCG PO TABS: 25.0000 ug | ORAL_TABLET | Freq: Every day | ORAL | Status: DC

## 2012-04-30 NOTE — Progress Notes (Signed)
Subjective:    Patient ID: Joseph Lindsey, male    DOB: 1970/12/22, 41 y.o.   MRN: [id number]  HPI  Patient seen for complete physical. History of chronic thrombocytopenia followed by hematology. No clear etiology. Platelet counts been stable. He has reported past history of hypothyroidism but is currently not on any medication. He has occasional fatigue issues. Tetanus is up-to-date. No consistent exercise. Nonsmoker.  Family history reviewed is significant for relatives with hypertension and type 2 diabetes. No known family history of thyroid disorder.  Past Medical History  Diagnosis Date  . Fissure, anal     2003  . Migraines   . Primary thrombocytopenia, unspecified    No past surgical history on file.  reports that he has never smoked. He has never used smokeless tobacco. He reports that he drinks alcohol. He reports that he does not use illicit drugs. family history includes Asthma in his brother, father, and sister; Diabetes in his father and sister; Hypertension in his father; and Sickle cell trait in an unspecified family member. No Known Allergies  Lab Results  Component Value Date   WBC 4.7 04/23/2012   HGB 15.8 04/23/2012   HCT 48.5 04/23/2012   PLT 64.0 Repeated and verified X2.* 04/23/2012   GLUCOSE 92 04/23/2012   CHOL 251* 04/23/2012   TRIG 68.0 04/23/2012   HDL 60.60 04/23/2012   LDLDIRECT 186.7 04/23/2012   ALT 26 04/23/2012   AST 24 04/23/2012   NA 141 04/23/2012   K 4.5 04/23/2012   CL 106 04/23/2012   CREATININE 1.3 04/23/2012   BUN 9 04/23/2012   CO2 25 04/23/2012   TSH 6.28* 04/23/2012   PSA 0.36 04/23/2012      Review of Systems  Constitutional: Positive for fatigue. Negative for fever, activity change and appetite change.  HENT: Negative for ear pain, congestion and trouble swallowing.   Eyes: Negative for pain and visual disturbance.  Respiratory: Negative for cough, shortness of breath and wheezing.   Cardiovascular: Negative for chest pain and  palpitations.  Gastrointestinal: Negative for nausea, vomiting, abdominal pain, diarrhea, constipation, blood in stool, abdominal distention and rectal pain.  Genitourinary: Negative for dysuria, hematuria and testicular pain.  Musculoskeletal: Negative for joint swelling and arthralgias.  Skin: Negative for rash.  Neurological: Negative for dizziness, syncope and headaches.  Hematological: Negative for adenopathy.  Psychiatric/Behavioral: Negative for confusion and dysphoric mood.       Objective:   Physical Exam  Constitutional: He is oriented to person, place, and time. He appears well-developed and well-nourished. No distress.  HENT:  Head: Normocephalic and atraumatic.  Right Ear: External ear normal.  Left Ear: External ear normal.  Mouth/Throat: Oropharynx is clear and moist.  Eyes: Conjunctivae and EOM are normal. Pupils are equal, round, and reactive to light.  Neck: Normal range of motion. Neck supple. No thyromegaly present.  Cardiovascular: Normal rate, regular rhythm and normal heart sounds.   No murmur heard. Pulmonary/Chest: No respiratory distress. He has no wheezes. He has no rales.  Abdominal: Soft. Bowel sounds are normal. He exhibits no distension and no mass. There is no tenderness. There is no rebound and no guarding.  Musculoskeletal: He exhibits no edema.  Lymphadenopathy:    He has no cervical adenopathy.  Neurological: He is alert and oriented to person, place, and time. He displays normal reflexes. No cranial nerve deficit.  Skin: No rash noted.  Psychiatric: He has a normal mood and affect.  Assessment & Plan:  Complete physical. Patient has mildly elevated TSH. Some recent mild fatigue. Dyslipidemia with elevated cholesterol and LDL. Start levothyroxin 25 mcg daily and recheck TSH in 3 months. Discussed importance of regular exercise. Continue close followup with hematology

## 2012-06-01 ENCOUNTER — Telehealth: Payer: Self-pay | Admitting: Oncology

## 2012-06-01 NOTE — Telephone Encounter (Signed)
pt called and moved appt to 7/3  aom

## 2012-06-03 ENCOUNTER — Other Ambulatory Visit: Payer: BC Managed Care – PPO

## 2012-06-03 ENCOUNTER — Ambulatory Visit: Payer: BC Managed Care – PPO | Admitting: Oncology

## 2012-06-10 ENCOUNTER — Ambulatory Visit (HOSPITAL_BASED_OUTPATIENT_CLINIC_OR_DEPARTMENT_OTHER): Payer: BC Managed Care – PPO | Admitting: Oncology

## 2012-06-10 ENCOUNTER — Other Ambulatory Visit (HOSPITAL_BASED_OUTPATIENT_CLINIC_OR_DEPARTMENT_OTHER): Payer: BC Managed Care – PPO | Admitting: Lab

## 2012-06-10 ENCOUNTER — Telehealth: Payer: Self-pay | Admitting: Oncology

## 2012-06-10 VITALS — BP 108/63 | HR 59 | Temp 97.0°F | Wt 174.0 lb

## 2012-06-10 DIAGNOSIS — D696 Thrombocytopenia, unspecified: Secondary | ICD-10-CM

## 2012-06-10 LAB — COMPREHENSIVE METABOLIC PANEL
AST: 25 U/L (ref 0–37)
Albumin: 4.5 g/dL (ref 3.5–5.2)
Alkaline Phosphatase: 58 U/L (ref 39–117)
Calcium: 9.7 mg/dL (ref 8.4–10.5)
Chloride: 103 mEq/L (ref 96–112)
Glucose, Bld: 86 mg/dL (ref 70–99)
Potassium: 3.7 mEq/L (ref 3.5–5.3)
Sodium: 139 mEq/L (ref 135–145)
Total Protein: 7.3 g/dL (ref 6.0–8.3)

## 2012-06-10 LAB — CBC WITH DIFFERENTIAL/PLATELET
Basophils Absolute: 0 10*3/uL (ref 0.0–0.1)
EOS%: 1.2 % (ref 0.0–7.0)
HCT: 44.7 % (ref 38.4–49.9)
HGB: 15.3 g/dL (ref 13.0–17.1)
MCH: 27.4 pg (ref 27.2–33.4)
MONO#: 0.4 10*3/uL (ref 0.1–0.9)
NEUT#: 2.1 10*3/uL (ref 1.5–6.5)
RDW: 13.9 % (ref 11.0–14.6)
WBC: 4.3 10*3/uL (ref 4.0–10.3)
lymph#: 1.8 10*3/uL (ref 0.9–3.3)

## 2012-06-10 NOTE — Telephone Encounter (Signed)
appts made and printed for pt aom °

## 2012-06-10 NOTE — Progress Notes (Signed)
Hematology and Oncology Follow Up Visit  Joseph Lindsey [id number] Dec 02, 1971 41 y.o. 06/10/2012 9:55 AM  CC: Joseph Lindsey, M.D.    Principle Diagnosis: This is a 41 year old gentleman with thrombocytopenia etiologies  Including autoimmune thrombocytopenia such as idiopathic thrombocytopenic purpura vs. a platelet function abnormality such as Bernard-Soulier or May-Hegglin anomaly.   Current therapy: Observation and  surveillance.  Interim History:   Joseph Lindsey is a pleasant gentleman from [place] we have been following since July 2011 with platelet count between 50,000 and 60,000.  His workup has been really unrevealing at this point.  His abdominal ultrasound did not really show any evidence of any splenomegaly.  He did not report really any active bleeding.  Did not report any hematochezia.  Did not report any melena, did not report any epistaxis.  Did report some occasional easy bruisability.  He did not report any chest pain or difficulty breathing.  He had not reported any major changes in his performance status or activity level. He was recently diagnosed with hypothyroidism and was started on thyroid supplement.    Medications: I have reviewed the patient's current medications. Current outpatient prescriptions:levothyroxine (LEVOTHROID) 25 MCG tablet, Take 1 tablet (25 mcg total) by mouth daily., Disp: 30 tablet, Rfl: 11;  fexofenadine-pseudoephedrine (ALLEGRA-D 24) 180-240 MG per 24 hr tablet, Take 1 tablet by mouth daily., Disp: 30 tablet, Rfl: 2;  guaiFENesin-codeine (ROBITUSSIN AC) 100-10 MG/5ML syrup, Take 5 mLs by mouth 2 (two) times daily as needed for cough., Disp: 120 mL, Rfl: 0 naproxen (NAPROSYN) 500 MG tablet, Take 500 mg by mouth 2 (two) times daily with a meal.  , Disp: , Rfl:   Allergies: No Known Allergies  Past Medical History, Surgical history, Social history, and Family History were reviewed and updated.  Review of Systems: Constitutional:  Negative for  fever, chills, night sweats, anorexia, weight loss, pain. Cardiovascular: no chest pain or dyspnea on exertion Respiratory: no cough, shortness of breath, or wheezing Neurological: no TIA or stroke symptoms Dermatological: negative ENT: negative Skin: Negative. Gastrointestinal: no abdominal pain, change in bowel habits, or black or bloody stools Genito-Urinary: no dysuria, trouble voiding, or hematuria Hematological and Lymphatic: negative Breast: negative Musculoskeletal: negative Remaining ROS negative. Physical Exam: Blood pressure 108/63, pulse 59, temperature 97 F (36.1 C), temperature source Oral, weight 174 lb (78.926 kg). ECOG: 0 General appearance: alert Head: Normocephalic, without obvious abnormality, atraumatic Neck: no adenopathy, no carotid bruit, no JVD, supple, symmetrical, trachea midline and thyroid not enlarged, symmetric, no tenderness/mass/nodules Lymph nodes: Cervical, supraclavicular, and axillary nodes normal. Heart:regular rate and rhythm, S1, S2 normal, no murmur, click, rub or gallop Lung:chest clear, no wheezing, rales, normal symmetric air entry Abdomin: soft, non-tender, without masses or organomegaly EXT:no erythema, induration, or nodules   Lab Results: Lab Results  Component Value Date   WBC 4.3 06/10/2012   HGB 15.3 06/10/2012   HCT 44.7 06/10/2012   MCV 80.0 06/10/2012   PLT 64* 06/10/2012     Chemistry      Component Value Date/Time   NA 141 04/23/2012 0927   K 4.5 04/23/2012 0927   CL 106 04/23/2012 0927   CO2 25 04/23/2012 0927   BUN 9 04/23/2012 0927   CREATININE 1.3 04/23/2012 0927      Component Value Date/Time   CALCIUM 9.7 04/23/2012 0927   ALKPHOS 75 04/23/2012 0927   AST 24 04/23/2012 0927   ALT 26 04/23/2012 0927   BILITOT 0.6 04/23/2012 8469  Impression and Plan: This is a pleasant, 41 year old gentleman with the following issues:  1. Thrombocytopenia.  Again, the differential diagnosis including autoimmune versus  medication, versus a platelet function abnormality such as Bernard-Soulier or May-Hegglin anomaly.  Again, his peripheral smear showed rather giant platelets at this point.  His platelet count is actually better today.  It is up to 64,000.  He has really no bleeding.  So we are going to hold off on any steroid or IVIG intervention unless he is bleeding or having surgical procedure.   2. Followup will be in 6 months' time.   [facility], MD 7/3/20139:55 AM

## 2012-10-19 ENCOUNTER — Telehealth: Payer: Self-pay | Admitting: Family Medicine

## 2012-10-19 ENCOUNTER — Ambulatory Visit (INDEPENDENT_AMBULATORY_CARE_PROVIDER_SITE_OTHER): Payer: BC Managed Care – PPO | Admitting: Family Medicine

## 2012-10-19 VITALS — BP 100/62 | Temp 98.1°F | Wt 174.0 lb

## 2012-10-19 DIAGNOSIS — M79643 Pain in unspecified hand: Secondary | ICD-10-CM

## 2012-10-19 DIAGNOSIS — M79609 Pain in unspecified limb: Secondary | ICD-10-CM

## 2012-10-19 DIAGNOSIS — E039 Hypothyroidism, unspecified: Secondary | ICD-10-CM

## 2012-10-19 NOTE — Telephone Encounter (Signed)
Error/njr °

## 2012-10-19 NOTE — Patient Instructions (Signed)
Follow up again in a week if your pain has not gotten any better.

## 2012-10-19 NOTE — Progress Notes (Signed)
Subjective:     Patient ID: UNDRA HARRIMAN, male   DOB: 19-Aug-1971, 41 y.o.   MRN: [id number]  HPI 41 year old male with 2 day history of R hand pain.  He describes the pain as aching and slightly burning and localizes narrowly to just medial to the thenar eminence, not extending past the wrist.  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  While working on Sat night, he states that the pain was about 9/10 in intensity, but generally is not present if he is not using his R hand.  He denies any specific movement or injury that would have brought it on.  Has not tried any pain medications or ice to relieve.   Review of Systems  Musculoskeletal: Positive for myalgias.       R palmar hand pain medial to thenar eminence with use.       Objective:   Physical Exam  Constitutional: He appears well-developed and well-nourished. No distress.  Musculoskeletal: Normal range of motion. He exhibits tenderness (Pain with grip and adduction of R thumb without warmth or swelling.). He exhibits no edema.       No numbness or tingling.  Skin: Skin is warm and dry. No erythema (No brusing or erythema over site of pain.).       Assessment:     41 year old here for evaluation of 2-day history or R palmar hand pain localized narrowly to just medial of R thenar eminence.    Plan:     1. R hand pain: likely muscular in origin.  Gripping appears to incite, but there is no shooting pain or tingling of the lateral palm or digits II and III that would be suggestive of carpal tunnel/median nerve impingement.  Normal ROM without pain or swelling make fracture or dislocation less likely.  Plain film probably not indicated at this time yet.  For now, use Tylenol for pain management; given history of low platelets, avoid regular NSAID use.  Follow up again in 1 week if no resolution in symptoms.  Marthann Schiller, MS3  Agree with assessment and plan as per Marthann Schiller, MS 3 Nonfocal neuro exam and no bony tenderness.  Suspect muscular. Pt also has hypothyroidism and needs repeat TSH. [name] MD

## 2012-10-21 ENCOUNTER — Other Ambulatory Visit: Payer: Self-pay | Admitting: *Deleted

## 2012-10-21 DIAGNOSIS — E039 Hypothyroidism, unspecified: Secondary | ICD-10-CM

## 2012-10-21 MED ORDER — LEVOTHYROXINE SODIUM 75 MCG PO TABS: 75.0000 ug | ORAL_TABLET | Freq: Every day | ORAL | Status: DC

## 2012-10-21 NOTE — Progress Notes (Signed)
Quick Note:  Pt informed on home VM ______ 

## 2012-12-15 ENCOUNTER — Telehealth: Payer: Self-pay | Admitting: Oncology

## 2012-12-15 ENCOUNTER — Other Ambulatory Visit (HOSPITAL_BASED_OUTPATIENT_CLINIC_OR_DEPARTMENT_OTHER): Payer: BC Managed Care – PPO

## 2012-12-15 ENCOUNTER — Ambulatory Visit (HOSPITAL_BASED_OUTPATIENT_CLINIC_OR_DEPARTMENT_OTHER): Payer: BC Managed Care – PPO | Admitting: Oncology

## 2012-12-15 VITALS — BP 113/64 | HR 54 | Temp 96.9°F | Resp 18 | Ht 69.0 in | Wt 174.5 lb

## 2012-12-15 DIAGNOSIS — E039 Hypothyroidism, unspecified: Secondary | ICD-10-CM

## 2012-12-15 DIAGNOSIS — D696 Thrombocytopenia, unspecified: Secondary | ICD-10-CM

## 2012-12-15 LAB — CBC WITH DIFFERENTIAL/PLATELET
BASO%: 0.7 % (ref 0.0–2.0)
Eosinophils Absolute: 0.1 10*3/uL (ref 0.0–0.5)
HCT: 45.3 % (ref 38.4–49.9)
LYMPH%: 44.2 % (ref 14.0–49.0)
MCHC: 33.8 g/dL (ref 32.0–36.0)
MCV: 81.2 fL (ref 79.3–98.0)
MONO%: 7.8 % (ref 0.0–14.0)
NEUT%: 44.6 % (ref 39.0–75.0)
Platelets: 62 10*3/uL — ABNORMAL LOW (ref 140–400)
RBC: 5.58 10*6/uL (ref 4.20–5.82)
nRBC: 0 % (ref 0–0)

## 2012-12-15 LAB — COMPREHENSIVE METABOLIC PANEL (CC13)
ALT: 21 U/L (ref 0–55)
BUN: 16 mg/dL (ref 7.0–26.0)
CO2: 25 mEq/L (ref 22–29)
Calcium: 9.8 mg/dL (ref 8.4–10.4)
Creatinine: 1.1 mg/dL (ref 0.7–1.3)
Total Bilirubin: 0.41 mg/dL (ref 0.20–1.20)

## 2012-12-15 LAB — TECHNOLOGIST REVIEW

## 2012-12-15 NOTE — Telephone Encounter (Signed)
appts made and printed for pt aom °

## 2012-12-15 NOTE — Progress Notes (Signed)
Hematology and Oncology Follow Up Visit  Joseph Lindsey [id number] Nov 30, 1971 42 y.o. 12/15/2012 10:33 AM  CC: [name], M.D.    Principle Diagnosis: This is a 42 year old gentleman with thrombocytopenia etiologies  Including autoimmune thrombocytopenia such as idiopathic thrombocytopenic purpura vs. a platelet function abnormality such as Bernard-Soulier or May-Hegglin anomaly.  Current therapy: Observation and  surveillance.  Interim History:   Mr. Gaede is a pleasant gentleman from [place] we have been following since July 2011 with platelet count between 50,000 and 60,000.  His workup has been really unrevealing at this point.  His abdominal ultrasound did not really show any evidence of any splenomegaly.  He did not report really any active bleeding.  Did not report any hematochezia.  Did not report any melena, did not report any epistaxis.  Did report some occasional easy bruisability.  He did not report any chest pain or difficulty breathing.  He had not reported any major changes in his performance status or activity level. He was diagnosed with hypothyroidism and was started on thyroid supplement. Since his last visit, he went back home to [place] and was told that his brother has similar condition.   Medications: I have reviewed the patient's current medications. Current outpatient prescriptions:levothyroxine (SYNTHROID, LEVOTHROID) 75 MCG tablet, Take 1 tablet (75 mcg total) by mouth daily., Disp: 90 tablet, Rfl: 3;  naproxen (NAPROSYN) 500 MG tablet, Take 500 mg by mouth 2 (two) times daily with a meal.  , Disp: , Rfl:   Allergies: No Known Allergies  Past Medical History, Surgical history, Social history, and Family History were reviewed and updated.  Review of Systems: Constitutional:  Negative for fever, chills, night sweats, anorexia, weight loss, pain. Cardiovascular: no chest pain or dyspnea on exertion Respiratory: no cough, shortness of breath, or  wheezing Neurological: no TIA or stroke symptoms Dermatological: negative ENT: negative Skin: Negative. Gastrointestinal: no abdominal pain, change in bowel habits, or black or bloody stools Genito-Urinary: no dysuria, trouble voiding, or hematuria Hematological and Lymphatic: negative Breast: negative Musculoskeletal: negative Remaining ROS negative. Physical Exam: Blood pressure 113/64, pulse 54, temperature 96.9 F (36.1 C), temperature source Oral, resp. rate 18, height 5\' 9"  (1.753 m), weight 174 lb 8 oz (79.153 kg). ECOG: 0 General appearance: alert Head: Normocephalic, without obvious abnormality, atraumatic Neck: no adenopathy, no carotid bruit, no JVD, supple, symmetrical, trachea midline and thyroid not enlarged, symmetric, no tenderness/mass/nodules Lymph nodes: Cervical, supraclavicular, and axillary nodes normal. Heart:regular rate and rhythm, S1, S2 normal, no murmur, click, rub or gallop Lung:chest clear, no wheezing, rales, normal symmetric air entry Abdomin: soft, non-tender, without masses or organomegaly EXT:no erythema, induration, or nodules   Lab Results: Lab Results  Component Value Date   WBC 4.4 12/15/2012   HGB 15.3 12/15/2012   HCT 45.3 12/15/2012   MCV 81.2 12/15/2012   PLT 62 Large & Occ giant platelets* 12/15/2012     Chemistry      Component Value Date/Time   NA 139 06/10/2012 0926   K 3.7 06/10/2012 0926   CL 103 06/10/2012 0926   CO2 25 06/10/2012 0926   BUN 13 06/10/2012 0926   CREATININE 1.19 06/10/2012 0926      Component Value Date/Time   CALCIUM 9.7 06/10/2012 0926   ALKPHOS 58 06/10/2012 0926   AST 25 06/10/2012 0926   ALT 23 06/10/2012 0926   BILITOT 0.6 06/10/2012 0926      Impression and Plan: This is a pleasant, 42 year old gentleman with the following  issues:  1. Thrombocytopenia.  Again, the differential diagnosis including autoimmune versus medication, versus a platelet function abnormality such as Bernard-Soulier or May-Hegglin anomaly.  Again,  his peripheral smear showed rather giant platelets at this point.  His platelet is 64,000.  He has really no bleeding.  So we are going to hold off on any steroid or IVIG intervention unless he is bleeding or having surgical procedure.   2. Followup will be in 6 months' time.   [facility], MD 1/7/201410:33 AM

## 2012-12-24 ENCOUNTER — Other Ambulatory Visit: Payer: Self-pay | Admitting: *Deleted

## 2012-12-24 MED ORDER — LEVOTHYROXINE SODIUM 75 MCG PO TABS: 75.0000 ug | ORAL_TABLET | Freq: Every day | ORAL | Status: DC

## 2013-04-26 ENCOUNTER — Telehealth: Payer: Self-pay | Admitting: Family Medicine

## 2013-04-26 NOTE — Telephone Encounter (Signed)
Pt is on levothyroxine and would like add TSH to cpx labs can I add?

## 2013-04-26 NOTE — Telephone Encounter (Signed)
Joseph Lindsey, that is a normal CPX lab that is done on all adult patients.  He may ask just to be sure when he is here, that's no problem.

## 2013-04-27 NOTE — Telephone Encounter (Signed)
Disregard had memory lapse

## 2013-05-06 ENCOUNTER — Other Ambulatory Visit (INDEPENDENT_AMBULATORY_CARE_PROVIDER_SITE_OTHER): Payer: BC Managed Care – PPO

## 2013-05-06 ENCOUNTER — Other Ambulatory Visit: Payer: BC Managed Care – PPO

## 2013-05-06 DIAGNOSIS — Z125 Encounter for screening for malignant neoplasm of prostate: Secondary | ICD-10-CM

## 2013-05-06 DIAGNOSIS — Z0000 Encounter for general adult medical examination without abnormal findings: Secondary | ICD-10-CM

## 2013-05-06 DIAGNOSIS — E039 Hypothyroidism, unspecified: Secondary | ICD-10-CM

## 2013-05-06 LAB — HEPATIC FUNCTION PANEL
ALT: 32 U/L (ref 0–53)
Bilirubin, Direct: 0.1 mg/dL (ref 0.0–0.3)
Total Bilirubin: 0.6 mg/dL (ref 0.3–1.2)

## 2013-05-06 LAB — LIPID PANEL
Cholesterol: 203 mg/dL — ABNORMAL HIGH (ref 0–200)
HDL: 45.6 mg/dL (ref >39.00)
Triglycerides: 65 mg/dL (ref 0.0–149.0)

## 2013-05-06 LAB — BASIC METABOLIC PANEL
BUN: 15 mg/dL (ref 6–23)
Creatinine, Ser: 1.1 mg/dL (ref 0.4–1.5)
GFR: 95.83 mL/min (ref >60.00)
Glucose, Bld: 80 mg/dL (ref 70–99)

## 2013-05-06 LAB — POCT URINALYSIS DIPSTICK
Bilirubin, UA: NEGATIVE
Blood, UA: NEGATIVE
Glucose, UA: NEGATIVE
Leukocytes, UA: NEGATIVE
Nitrite, UA: NEGATIVE
Urobilinogen, UA: 0.2

## 2013-05-06 LAB — PSA: PSA: 0.27 ng/mL (ref 0.10–4.00)

## 2013-05-06 LAB — LDL CHOLESTEROL, DIRECT: Direct LDL: 146.8 mg/dL

## 2013-05-06 LAB — TSH: TSH: 4.36 u[IU]/mL (ref 0.35–5.50)

## 2013-05-07 LAB — CBC WITH DIFFERENTIAL/PLATELET
Basophils Absolute: 0 10*3/uL (ref 0.0–0.1)
Eosinophils Relative: 1.3 % (ref 0.0–5.0)
Lymphs Abs: 1.9 10*3/uL (ref 0.7–4.0)
MCV: 81.9 fl (ref 78.0–100.0)
Monocytes Absolute: 0.4 10*3/uL (ref 0.1–1.0)
Neutrophils Relative %: 47.3 % (ref 43.0–77.0)
Platelets: 60 10*3/uL — ABNORMAL LOW (ref 150.0–400.0)
RDW: 14.4 % (ref 11.5–14.6)
WBC: 4.5 10*3/uL (ref 4.5–10.5)

## 2013-05-13 ENCOUNTER — Ambulatory Visit (INDEPENDENT_AMBULATORY_CARE_PROVIDER_SITE_OTHER): Payer: BC Managed Care – PPO | Admitting: Family Medicine

## 2013-05-13 ENCOUNTER — Encounter: Payer: Self-pay | Admitting: Family Medicine

## 2013-05-13 VITALS — BP 120/70 | HR 64 | Temp 98.4°F | Resp 18 | Ht 69.25 in | Wt 179.0 lb

## 2013-05-13 DIAGNOSIS — Z0000 Encounter for general adult medical examination without abnormal findings: Secondary | ICD-10-CM

## 2013-05-13 NOTE — Progress Notes (Signed)
  Subjective:    Patient ID: Joseph Lindsey, male    DOB: 04-09-1971, 42 y.o.   MRN: [id number]  HPI Patient here for physical. History of hypothyroidism on levothyroxin. Has chronic thrombocytopenia. Platelets been stable for several years. Followed by hematology. Patient nonsmoker. No alcohol use. Family history reviewed. Brother (who lives in [place]) recently diagnosed with gastric cancer  Past Medical History  Diagnosis Date  . Fissure, anal     2003  . Migraines   . Primary thrombocytopenia, unspecified    No past surgical history on file.  reports that he has never smoked. He has never used smokeless tobacco. He reports that  drinks alcohol. He reports that he does not use illicit drugs. family history includes Asthma in his brother, father, and sister; Cancer (age of onset: 44) in his brother; Diabetes in his father and sister; Hypertension in his father; and Sickle cell trait in an unspecified family member. No Known Allergies    Review of Systems  Constitutional: Negative for fever, activity change, appetite change and fatigue.  HENT: Negative for ear pain, congestion and trouble swallowing.   Eyes: Negative for pain and visual disturbance.  Respiratory: Negative for cough, shortness of breath and wheezing.   Cardiovascular: Negative for chest pain and palpitations.  Gastrointestinal: Negative for nausea, vomiting, abdominal pain, diarrhea, constipation, blood in stool, abdominal distention and rectal pain.  Genitourinary: Negative for dysuria, hematuria and testicular pain.  Musculoskeletal: Negative for joint swelling and arthralgias.  Skin: Negative for rash.  Neurological: Negative for dizziness, syncope and headaches.  Hematological: Negative for adenopathy.  Psychiatric/Behavioral: Negative for confusion and dysphoric mood.       Objective:   Physical Exam  Constitutional: He is oriented to person, place, and time. He appears well-developed and  well-nourished. No distress.  HENT:  Head: Normocephalic and atraumatic.  Right Ear: External ear normal.  Left Ear: External ear normal.  Mouth/Throat: Oropharynx is clear and moist.  Eyes: Conjunctivae and EOM are normal. Pupils are equal, round, and reactive to light.  Neck: Normal range of motion. Neck supple. No thyromegaly present.  Cardiovascular: Normal rate, regular rhythm and normal heart sounds.   No murmur heard. Pulmonary/Chest: No respiratory distress. He has no wheezes. He has no rales.  Abdominal: Soft. Bowel sounds are normal. He exhibits no distension and no mass. There is no tenderness. There is no rebound and no guarding.  Musculoskeletal: He exhibits no edema.  Lymphadenopathy:    He has no cervical adenopathy.  Neurological: He is alert and oriented to person, place, and time. He displays normal reflexes. No cranial nerve deficit.  Skin: No rash noted.  Psychiatric: He has a normal mood and affect.          Assessment & Plan:  Complete physical. Labs reviewed with patient. Platelets stable. Thyroid now at goal. Lipids have improved with treatment of hypothyroidism. He is encouraged to start regular exercise. Tetanus up-to-date

## 2013-06-15 ENCOUNTER — Encounter: Payer: Self-pay | Admitting: Internal Medicine

## 2013-06-15 ENCOUNTER — Telehealth: Payer: Self-pay | Admitting: Oncology

## 2013-06-15 ENCOUNTER — Ambulatory Visit (HOSPITAL_BASED_OUTPATIENT_CLINIC_OR_DEPARTMENT_OTHER): Payer: BC Managed Care – PPO | Admitting: Oncology

## 2013-06-15 ENCOUNTER — Other Ambulatory Visit (HOSPITAL_BASED_OUTPATIENT_CLINIC_OR_DEPARTMENT_OTHER): Payer: BC Managed Care – PPO | Admitting: Lab

## 2013-06-15 VITALS — BP 126/70 | HR 66 | Temp 98.1°F | Resp 18 | Ht 69.0 in | Wt 173.9 lb

## 2013-06-15 DIAGNOSIS — D696 Thrombocytopenia, unspecified: Secondary | ICD-10-CM

## 2013-06-15 DIAGNOSIS — R1310 Dysphagia, unspecified: Secondary | ICD-10-CM

## 2013-06-15 LAB — CBC WITH DIFFERENTIAL/PLATELET
BASO%: 0.3 % (ref 0.0–2.0)
Basophils Absolute: 0 10*3/uL (ref 0.0–0.1)
EOS%: 1.6 % (ref 0.0–7.0)
HCT: 48.2 % (ref 38.4–49.9)
HGB: 16.5 g/dL (ref 13.0–17.1)
LYMPH%: 44.4 % (ref 14.0–49.0)
MCH: 27.7 pg (ref 27.2–33.4)
MCHC: 34.2 g/dL (ref 32.0–36.0)
MCV: 81 fL (ref 79.3–98.0)
NEUT%: 44.7 % (ref 39.0–75.0)
Platelets: 63 10*3/uL — ABNORMAL LOW (ref 140–400)
lymph#: 1.7 10*3/uL (ref 0.9–3.3)

## 2013-06-15 LAB — COMPREHENSIVE METABOLIC PANEL (CC13)
ALT: 29 U/L (ref 0–55)
AST: 26 U/L (ref 5–34)
Albumin: 3.7 g/dL (ref 3.5–5.0)
Alkaline Phosphatase: 68 U/L (ref 40–150)
BUN: 14.6 mg/dL (ref 7.0–26.0)
Potassium: 3.9 mEq/L (ref 3.5–5.1)
Sodium: 140 mEq/L (ref 136–145)
Total Protein: 7.4 g/dL (ref 6.4–8.3)

## 2013-06-15 NOTE — Telephone Encounter (Signed)
advised pt on 8.1.14 Gi appt with Dr. Rhea Belton @ 9:45am

## 2013-06-15 NOTE — Progress Notes (Signed)
Hematology and Oncology Follow Up Visit  Joseph Lindsey [id number] February 01, 1971 42 y.o. 06/15/2013 12:39 PM  CC: [name], M.D.    Principle Diagnosis: This is a 42 year old gentleman with thrombocytopenia etiologies  Including autoimmune thrombocytopenia such as idiopathic thrombocytopenic purpura vs. a platelet function abnormality such as Bernard-Soulier or May-Hegglin anomaly.  Current therapy: Observation and  surveillance.  Interim History:   Joseph Lindsey is a pleasant gentleman from [place] we have been following since July 2011 with platelet count between 50,000 and 60,000.  His workup has been really unrevealing at this point.  His abdominal ultrasound did not really show any evidence of any splenomegaly in 2011.  He did not report really any active bleeding.  Did not report any hematochezia.  Did not report any melena, did not report any epistaxis.  Did report some occasional easy bruisability.  He did not report any chest pain or difficulty breathing.  He had not reported any major changes in his performance status or activity level. He was diagnosed with hypothyroidism and was started on thyroid supplement. Since his last visit, he has reported mild dysphagia and little weight loss. His brother was diagnosed with Gastric cancer in [place] and he is worried about that.   Medications: I have reviewed the patient's current medications. Current Outpatient Prescriptions  Medication Sig Dispense Refill  . levothyroxine (SYNTHROID, LEVOTHROID) 75 MCG tablet Take 1 tablet (75 mcg total) by mouth daily.  90 tablet  3  . naproxen (NAPROSYN) 500 MG tablet Take 500 mg by mouth 2 (two) times daily with a meal.         No current facility-administered medications for this visit.    Allergies: No Known Allergies  Past Medical History, Surgical history, Social history, and Family History were reviewed and updated.  Review of Systems: Constitutional:  Negative for fever, chills,  night sweats, anorexia, weight loss, pain. Cardiovascular: no chest pain or dyspnea on exertion Respiratory: no cough, shortness of breath, or wheezing Neurological: no TIA or stroke symptoms Dermatological: negative ENT: negative Skin: Negative. Gastrointestinal: no abdominal pain, change in bowel habits, or black or bloody stools Genito-Urinary: no dysuria, trouble voiding, or hematuria Hematological and Lymphatic: negative Breast: negative Musculoskeletal: negative Remaining ROS negative. Physical Exam: Blood pressure 126/70, pulse 66, temperature 98.1 F (36.7 C), temperature source Oral, resp. rate 18, height 5\' 9"  (1.753 m), weight 173 lb 14.4 oz (78.881 kg). ECOG: 0 General appearance: alert Head: Normocephalic, without obvious abnormality, atraumatic Neck: no adenopathy, no carotid bruit, no JVD, supple, symmetrical, trachea midline and thyroid not enlarged, symmetric, no tenderness/mass/nodules Lymph nodes: Cervical, supraclavicular, and axillary nodes normal. Heart:regular rate and rhythm, S1, S2 normal, no murmur, click, rub or gallop Lung:chest clear, no wheezing, rales, normal symmetric air entry Abdomin: soft, non-tender, without masses or organomegaly EXT:no erythema, induration, or nodules   Lab Results: Lab Results  Component Value Date   WBC 3.9* 06/15/2013   HGB 16.5 06/15/2013   HCT 48.2 06/15/2013   MCV 81.0 06/15/2013   PLT 63* 06/15/2013     Chemistry      Component Value Date/Time   NA 139 05/06/2013 1241   NA 140 12/15/2012 0948   K 4.2 05/06/2013 1241   K 4.1 12/15/2012 0948   CL 104 05/06/2013 1241   CL 106 12/15/2012 0948   CO2 26 05/06/2013 1241   CO2 25 12/15/2012 0948   BUN 15 05/06/2013 1241   BUN 16.0 12/15/2012 0948   CREATININE 1.1 05/06/2013  1241   CREATININE 1.1 12/15/2012 0948      Component Value Date/Time   CALCIUM 9.1 05/06/2013 1241   CALCIUM 9.8 12/15/2012 0948   ALKPHOS 61 05/06/2013 1241   ALKPHOS 76 12/15/2012 0948   AST 27 05/06/2013 1241   AST 22  12/15/2012 0948   ALT 32 05/06/2013 1241   ALT 21 12/15/2012 0948   BILITOT 0.6 05/06/2013 1241   BILITOT 0.41 12/15/2012 0948      Impression and Plan: This is a pleasant, 42 year old gentleman with the following issues:  1. Thrombocytopenia. The differential diagnosis including autoimmune versus medication, versus a platelet function abnormality such as Bernard-Soulier or May-Hegglin anomaly.  Again, his peripheral smear showed rather giant platelets at this point.  His platelet is 63,000.  He has really no bleeding.  So we are going to hold off on any steroid or IVIG intervention unless he is bleeding or having surgical procedure.   2. Recent family history diagnosis of gastric cancer (his brother who is 27 years old): He report mild symptoms of dysphagia. Given the fact that he spend most of his childhood and early adulthood in [place] think it would be reasonable to screen him for gastric cancer. He did have a little bit of weight loss coupled with his mild dysphagia and his mother being diagnosed at age such an early age I will obtain a CT scan of the abdomen and pelvis to rule out any gastric malignancy I will also refer him for gastroenterology for a possible screening endoscopy. 3. Followup: He'll be in 6 months sooner if needed to.   [facility], MD 7/8/201412:39 PM

## 2013-06-15 NOTE — Telephone Encounter (Signed)
gv and printed appt sched and avs for pt....gv pt barium....left  infor for [facility] @ GI to call me and the pt.Marland KitchenMarland KitchenMarland Kitchen

## 2013-06-22 ENCOUNTER — Encounter (HOSPITAL_COMMUNITY): Payer: Self-pay

## 2013-06-22 ENCOUNTER — Ambulatory Visit (HOSPITAL_COMMUNITY)
Admission: RE | Admit: 2013-06-22 | Discharge: 2013-06-22 | Disposition: A | Discharge status: Home / Self Care | Payer: BC Managed Care – PPO | Source: Ambulatory Visit | Attending: Oncology | Admitting: Oncology

## 2013-06-22 DIAGNOSIS — M6200 Separation of muscle (nontraumatic), unspecified site: Secondary | ICD-10-CM | POA: Insufficient documentation

## 2013-06-22 DIAGNOSIS — R109 Unspecified abdominal pain: Secondary | ICD-10-CM | POA: Insufficient documentation

## 2013-06-22 DIAGNOSIS — Z800 Family history of malignant neoplasm of digestive organs: Secondary | ICD-10-CM | POA: Insufficient documentation

## 2013-06-22 DIAGNOSIS — D696 Thrombocytopenia, unspecified: Secondary | ICD-10-CM | POA: Insufficient documentation

## 2013-06-22 DIAGNOSIS — R1310 Dysphagia, unspecified: Secondary | ICD-10-CM | POA: Insufficient documentation

## 2013-06-22 DIAGNOSIS — R634 Abnormal weight loss: Secondary | ICD-10-CM | POA: Insufficient documentation

## 2013-06-22 MED ORDER — IOHEXOL 300 MG/ML  SOLN
100.0000 mL | Freq: Once | INTRAMUSCULAR | Status: AC | PRN
  Administered 2013-06-22: 100 mL via INTRAVENOUS

## 2013-07-07 ENCOUNTER — Encounter: Payer: Self-pay | Admitting: Internal Medicine

## 2013-07-09 ENCOUNTER — Ambulatory Visit: Payer: BC Managed Care – PPO | Admitting: Internal Medicine

## 2013-08-02 ENCOUNTER — Encounter: Payer: Self-pay | Admitting: Internal Medicine

## 2013-08-10 ENCOUNTER — Encounter: Payer: Self-pay | Admitting: Internal Medicine

## 2013-08-10 ENCOUNTER — Ambulatory Visit (INDEPENDENT_AMBULATORY_CARE_PROVIDER_SITE_OTHER): Payer: BC Managed Care – PPO | Admitting: Internal Medicine

## 2013-08-10 VITALS — BP 118/64 | HR 72 | Ht 69.75 in | Wt 175.0 lb

## 2013-08-10 DIAGNOSIS — R1310 Dysphagia, unspecified: Secondary | ICD-10-CM

## 2013-08-10 DIAGNOSIS — Z800 Family history of malignant neoplasm of digestive organs: Secondary | ICD-10-CM

## 2013-08-10 NOTE — Progress Notes (Signed)
Patient ID: Joseph Lindsey, male   DOB: [birth year], 42 y.o.   MRN: [id number] HPI: Joseph Lindsey is a 42 yo male with PMH of hypothyroidism, and primary thrombocytopenia who is seen in consultation at the request of Dr. Clelia Croft for evaluation of dysphagia, weight loss, and family history of gastric cancer.  The patient is here alone today. He reports overall he is feeling well. He does report intermittent dysphagia. This can occur to both solids or liquids. It is not occurring on a daily basis. He denies nausea or vomiting. He denies early satiety. No abdominal pain recently. He reports a good appetite. He has noticed some epigastric burning when he drinks coffee, but this does not occur with other liquids or solids. He does report frequent hiccups, occasionally these can last for greater than 24 hours. This tends to be triggered by eating large meals or at times coughing. He denies heartburn. No odynophagia. No rectal bleeding or melena. Normal bowel habits without diarrhea or constipation. From a weight standpoint, he feels his weight has been stable recently.  He reports his brother was diagnosed within the past year with gastric cancer. This was treated and [place]. He reports his brother did have H. Pylori.  Both he and his brother are from [place].  Past Medical History  Diagnosis Date  . Fissure, anal     2003  . Migraines   . Primary thrombocytopenia, unspecified   . Thyroid disease     hypothyroid    History reviewed. No pertinent past surgical history.  Current Outpatient Prescriptions  Medication Sig Dispense Refill  . levothyroxine (SYNTHROID, LEVOTHROID) 75 MCG tablet Take 1 tablet (75 mcg total) by mouth daily.  90 tablet  3  . naproxen (NAPROSYN) 500 MG tablet Take 500 mg by mouth 2 (two) times daily with a meal.         No current facility-administered medications for this visit.    No Known Allergies  Family History  Problem Relation Age of Onset  . Hypertension  Father   . Diabetes Father   . Asthma Father   . Diabetes Sister   . Asthma Sister   . Asthma Brother   . Cancer Brother 72    gastric cancer  . Sickle cell trait      mother    History  Substance Use Topics  . Smoking status: Never Smoker   . Smokeless tobacco: Never Used  . Alcohol Use: Yes     Comment: once a week    ROS: As per history of present illness, otherwise negative  BP 118/64  Pulse 72  Ht 5' 9.75" (1.772 m)  Wt 175 lb (79.379 kg)  BMI 25.28 kg/m2 Constitutional: Well-developed and well-nourished. No distress. HEENT: Normocephalic and atraumatic. Oropharynx is clear and moist. No oropharyngeal exudate. Conjunctivae are normal.  No scleral icterus. Neck: Neck supple. Trachea midline. Cardiovascular: Normal rate, regular rhythm and intact distal pulses. No M/R/G Pulmonary/chest: Effort normal and breath sounds normal. No wheezing, rales or rhonchi. Abdominal: Soft, nontender, nondistended. Bowel sounds active throughout. There are no masses palpable. No hepatosplenomegaly. Extremities: no clubbing, cyanosis, or edema Lymphadenopathy: No cervical adenopathy noted. Neurological: Alert and oriented to person place and time. Skin: Skin is warm and dry. No rashes noted. Psychiatric: Normal mood and affect. Behavior is normal.  RELEVANT LABS AND IMAGING: CBC    Component Value Date/Time   WBC 3.9* 06/15/2013 1203   WBC 4.5 05/06/2013 1241   RBC 5.95* 06/15/2013 1203  RBC 5.67 05/06/2013 1241   HGB 16.5 06/15/2013 1203   HGB 15.8 05/06/2013 1241   HCT 48.2 06/15/2013 1203   HCT 46.5 05/06/2013 1241   PLT 63* 06/15/2013 1203   PLT 60.0* 05/06/2013 1241   MCV 81.0 06/15/2013 1203   MCV 81.9 05/06/2013 1241   MCH 27.7 06/15/2013 1203   MCHC 34.2 06/15/2013 1203   MCHC 34.0 05/06/2013 1241   RDW 13.9 06/15/2013 1203   RDW 14.4 05/06/2013 1241   LYMPHSABS 1.7 06/15/2013 1203   LYMPHSABS 1.9 05/06/2013 1241   MONOABS 0.4 06/15/2013 1203   MONOABS 0.4 05/06/2013 1241   EOSABS 0.1  06/15/2013 1203   EOSABS 0.1 05/06/2013 1241   BASOSABS 0.0 06/15/2013 1203   BASOSABS 0.0 05/06/2013 1241    CMP     Component Value Date/Time   NA 140 06/15/2013 1203   NA 139 05/06/2013 1241   K 3.9 06/15/2013 1203   K 4.2 05/06/2013 1241   CL 104 05/06/2013 1241   CL 106 12/15/2012 0948   CO2 27 06/15/2013 1203   CO2 26 05/06/2013 1241   GLUCOSE 80 06/15/2013 1203   GLUCOSE 80 05/06/2013 1241   GLUCOSE 82 12/15/2012 0948   BUN 14.6 06/15/2013 1203   BUN 15 05/06/2013 1241   CREATININE 1.3 06/15/2013 1203   CREATININE 1.1 05/06/2013 1241   CALCIUM 9.6 06/15/2013 1203   CALCIUM 9.1 05/06/2013 1241   PROT 7.4 06/15/2013 1203   PROT 6.9 05/06/2013 1241   ALBUMIN 3.7 06/15/2013 1203   ALBUMIN 3.7 05/06/2013 1241   AST 26 06/15/2013 1203   AST 27 05/06/2013 1241   ALT 29 06/15/2013 1203   ALT 32 05/06/2013 1241   ALKPHOS 68 06/15/2013 1203   ALKPHOS 61 05/06/2013 1241   BILITOT 0.51 06/15/2013 1203   BILITOT 0.6 05/06/2013 1241   GFRNONAA 86.25 05/24/2010 1044   Clinical Data: Thrombocytopenia, weight loss.  Family history of gastric carcinoma.  Abdominal pain.   CT ABDOMEN WITH CONTRAST -- 06/22/2013   Technique:  Multidetector CT imaging of the abdomen was performed following the standard protocol during bolus administration of intravenous contrast.   Contrast: OMNIPAQUE IOHEXOL 300 MG/ML  SOLN   Comparison: Ultrasound 07/03/2010   Findings: Visualized lung bases clear.  Unremarkable liver, nondilated gallbladder, spleen, adrenal glands, kidneys, pancreas, portal vein. No splenomegaly.  Stomach physiologically distended, unremarkable. No mass or wall thickening. Visualized portions of small bowel decompressed.  Moderate colonic fecal material.  Normal appendix.  Aorta unremarkable.  No ascites.  No free air. Periumbilical rectus diastasis.  No adenopathy localized.  No hydronephrosis.  Regional bones unremarkable.   IMPRESSION:   1.  No acute abnormality.  ASSESSMENT/PLAN:  42 yo male with PMH  of hypothyroidism, and primary thrombocytopenia who is seen in consultation at the request of Dr. Clelia Croft for evaluation of dysphagia, weight loss, and family history of gastric cancer  1.  Intermittent dysphagia, family hx of gastric cancer -- he reports mild intermittent dysphagia, which is not occurring on a consistent basis. Given this symptom, and his family history of gastric cancer, I recommended proceeding to upper endoscopy. The test was discussed today include the risks and benefits and he is agreeable to proceed. I will plan gastric biopsies to rule out H. pylori. He does have a history of mild thrombocytopenia, but no evidence for portal hypertension or liver disease. Platelets checked in July were approximately 60,000, which would not preclude biopsies, or dilation if necessary at endoscopy

## 2013-08-10 NOTE — Patient Instructions (Addendum)
You have been scheduled for an endoscopy with propofol. Please follow written instructions given to you at your visit today. If you use inhalers (even only as needed), please bring them with you on the day of your procedure. Your physician has requested that you go to www.startemmi.com and enter the access code given to you at your visit today. This web site gives a general overview about your procedure. However, you should still follow specific instructions given to you by our office regarding your preparation for the procedure.                                                We are excited to introduce MyChart, a new best-in-class service that provides you online access to important information in your electronic medical record. We want to make it easier for you to view your health information - all in one secure location - when and where you need it. We expect MyChart will enhance the quality of care and service we provide.  When you register for MyChart, you can:    View your test results.    Request appointments and receive appointment reminders via email.    Request medication renewals.    View your medical history, allergies, medications and immunizations.    Communicate with your physician's office through a password-protected site.    Conveniently print information such as your medication lists.  To find out if MyChart is right for you, please talk to a member of our clinical staff today. We will gladly answer your questions about this free health and wellness tool.  If you are age 18 or older and want a member of your family to have access to your record, you must provide written consent by completing a proxy form available at our office. Please speak to our clinical staff about guidelines regarding accounts for patients younger than age 18.  As you activate your MyChart account and need any technical assistance, please call the MyChart technical support line at [phone]  ([phone]) or email your question to [email or web address]. If you email your question(s), please include your name, a return phone number and the best time to reach you.  If you have non-urgent health-related questions, you can send a message to our office through MyChart at [email or web address]. If you have a medical emergency, call 911.  Thank you for using MyChart as your new health and wellness resource!   MyChart licensed from [employer],  1999-2010. Patents Pending.   

## 2013-08-17 ENCOUNTER — Encounter: Payer: Self-pay | Admitting: Family Medicine

## 2013-08-17 ENCOUNTER — Telehealth: Payer: Self-pay | Admitting: *Deleted

## 2013-08-17 ENCOUNTER — Ambulatory Visit (INDEPENDENT_AMBULATORY_CARE_PROVIDER_SITE_OTHER): Payer: BC Managed Care – PPO | Admitting: Family Medicine

## 2013-08-17 VITALS — BP 114/66 | HR 56 | Temp 97.9°F | Wt 178.0 lb

## 2013-08-17 DIAGNOSIS — D696 Thrombocytopenia, unspecified: Secondary | ICD-10-CM

## 2013-08-17 DIAGNOSIS — K648 Other hemorrhoids: Secondary | ICD-10-CM

## 2013-08-17 DIAGNOSIS — K921 Melena: Secondary | ICD-10-CM

## 2013-08-17 LAB — CBC WITH DIFFERENTIAL/PLATELET
Basophils Absolute: 0 10*3/uL (ref 0.0–0.1)
Eosinophils Absolute: 0.1 10*3/uL (ref 0.0–0.7)
Hemoglobin: 15.3 g/dL (ref 13.0–17.0)
Lymphocytes Relative: 43.6 % (ref 12.0–46.0)
MCHC: 33.5 g/dL (ref 30.0–36.0)
Neutro Abs: 2.7 10*3/uL (ref 1.4–7.7)
Platelets: 48 10*3/uL — CL (ref 150.0–400.0)
RDW: 14.3 % (ref 11.5–14.6)

## 2013-08-17 NOTE — Telephone Encounter (Signed)
An abnormal platelet count of 48.0.  Noted by Dr Clent Ridges.  Fax copy on Dr [employer].

## 2013-08-17 NOTE — Patient Instructions (Addendum)
Hemorrhoids Hemorrhoids are swollen veins around the rectum or anus. There are two types of hemorrhoids:   Internal hemorrhoids. These occur in the veins just inside the rectum. They may poke through to the outside and become irritated and painful.  External hemorrhoids. These occur in the veins outside the anus and can be felt as a painful swelling or hard lump near the anus. CAUSES  Pregnancy.   Obesity.   Constipation or diarrhea.   Straining to have a bowel movement.   Sitting for long periods on the toilet.  Heavy lifting or other activity that caused you to strain.  Anal intercourse. SYMPTOMS   Pain.   Anal itching or irritation.   Rectal bleeding.   Fecal leakage.   Anal swelling.   One or more lumps around the anus.  DIAGNOSIS  Your caregiver may be able to diagnose hemorrhoids by visual examination. Other examinations or tests that may be performed include:   Examination of the rectal area with a gloved hand (digital rectal exam).   Examination of anal canal using a small tube (scope).   A blood test if you have lost a significant amount of blood.  A test to look inside the colon (sigmoidoscopy or colonoscopy). TREATMENT Most hemorrhoids can be treated at home. However, if symptoms do not seem to be getting better or if you have a lot of rectal bleeding, your caregiver may perform a procedure to help make the hemorrhoids get smaller or remove them completely. Possible treatments include:   Placing a rubber band at the base of the hemorrhoid to cut off the circulation (rubber band ligation).   Injecting a chemical to shrink the hemorrhoid (sclerotherapy).   Using a tool to burn the hemorrhoid (infrared light therapy).   Surgically removing the hemorrhoid (hemorrhoidectomy).   Stapling the hemorrhoid to block blood flow to the tissue (hemorrhoid stapling).  HOME CARE INSTRUCTIONS   Eat foods with fiber, such as whole grains, beans,  nuts, fruits, and vegetables. Ask your doctor about taking products with added fiber in them (fibersupplements).  Increase fluid intake. Drink enough water and fluids to keep your urine clear or pale yellow.   Exercise regularly.   Go to the bathroom when you have the urge to have a bowel movement. Do not wait.   Avoid straining to have bowel movements.   Keep the anal area dry and clean. Use wet toilet paper or moist towelettes after a bowel movement.   Medicated creams and suppositories may be used or applied as directed.   Only take over-the-counter or prescription medicines as directed by your caregiver.   Take warm sitz baths for 15 20 minutes, 3 4 times a day to ease pain and discomfort.   Place ice packs on the hemorrhoids if they are tender and swollen. Using ice packs between sitz baths may be helpful.   Put ice in a plastic bag.   Place a towel between your skin and the bag.   Leave the ice on for 15 20 minutes, 3 4 times a day.   Do not use a donut-shaped pillow or sit on the toilet for long periods. This increases blood pooling and pain.  SEEK MEDICAL CARE IF:  You have increasing pain and swelling that is not controlled by treatment or medicine.  You have uncontrolled bleeding.  You have difficulty or you are unable to have a bowel movement.  You have pain or inflammation outside the area of the hemorrhoids. MAKE SURE YOU:    Understand these instructions.  Will watch your condition.  Will get help right away if you are not doing well or get worse. Document Released: 11/22/2000 Document Revised: 11/11/2012 Document Reviewed: 09/29/2012 Exit[employer] Patient Information 2014 [employer].  

## 2013-08-17 NOTE — Progress Notes (Signed)
  Subjective:    Patient ID: Joseph Lindsey, male    DOB: 1971-07-29, 42 y.o.   MRN: [id number]  HPI Patient seen for acute issue of bright red blood per rectum yesterday when wiping after bowel movement He has never had prior history of any rectal bleeding. He has not noted any blood with bowel movement today.  Past medical history is significant for chronic thrombocytopenia which has been stable, hypothyroidism, and some recent mild dysphagia. He is actually scheduled for upper endoscopy next week.  Patient relates few days ago had some mild epigastric pain which has since resolved. He denies any nausea or vomiting. No appetite or weight changes. No change of bowel movements. No melena. He denies any history of hemorrhoids or fissure. No perianal pain.  Patient had reported some recent dysphagia to oncologist and had CT abdomen and pelvis which was unremarkable. Brother apparently diagnosed with gastric cancer in his early 60s.  He has chronic thrombocytopenia which has been stable with platelet count of 60,000.  Lab Results  Component Value Date   WBC 3.9* 06/15/2013   HGB 16.5 06/15/2013   HCT 48.2 06/15/2013   MCV 81.0 06/15/2013   PLT 63* 06/15/2013   Past Medical History  Diagnosis Date  . Fissure, anal     2003  . Migraines   . Primary thrombocytopenia, unspecified   . Thyroid disease     hypothyroid   No past surgical history on file.  reports that he has never smoked. He has never used smokeless tobacco. He reports that  drinks alcohol. He reports that he does not use illicit drugs. family history includes Asthma in his brother, father, and sister; Cancer (age of onset: 50) in his brother; Diabetes in his father and sister; Hypertension in his father; Sickle cell trait in an other family member. No Known Allergies [name]    Review of Systems  Constitutional: Negative for fever, chills, appetite change, fatigue and unexpected weight change.  Respiratory: Negative for  shortness of breath.   Cardiovascular: Negative for chest pain.  Gastrointestinal: Positive for blood in stool. Negative for nausea, vomiting, abdominal pain, diarrhea and constipation.  Neurological: Negative for dizziness and syncope.  Hematological: Negative for adenopathy.       Objective:   Physical Exam  Constitutional: He appears well-developed and well-nourished.  Cardiovascular: Normal rate and regular rhythm.   Pulmonary/Chest: Effort normal and breath sounds normal. No respiratory distress. He has no wheezes. He has no rales.  Abdominal: Soft. Bowel sounds are normal. He exhibits no distension and no mass. There is no tenderness. There is no rebound and no guarding.  Genitourinary:  Rectal exam reveals no visible external hemorrhoids. No visible anal fissure. Digital exam reveals no mass. Minimal stool in rectal vault and heme-negative. Anoscopy reveals some internal hemorrhoids but no active bleeding. No other abnormalities noted          Assessment & Plan:  Single episode of hematochezia. He does have some internal hemorrhoids but we cannot verify this as source of bleeding as he does not have any evidence for active bleeding at this time. No visible anal fissure. No visible external hemorrhoids.  Will repeat CBC with hx of chronic thrombocytopenia.  He does not report any other bleeding complications such as bruising.

## 2013-08-19 ENCOUNTER — Other Ambulatory Visit: Payer: Self-pay | Admitting: Gastroenterology

## 2013-08-19 ENCOUNTER — Telehealth: Payer: Self-pay | Admitting: Gastroenterology

## 2013-08-19 NOTE — Telephone Encounter (Signed)
Spoke to pt. Told him dr. Rhea Belton would like to add a colonoscopy to his already scheduled egd on 08/26/2013 due to rectal bleeding. Pt agreed. Changed time to accommodate both procedures. Told pt I will be sending out new instructions with new times, and for him to call me once he gets them so we can go over them. Pt verbalized understanding.

## 2013-08-24 ENCOUNTER — Telehealth: Payer: Self-pay | Admitting: Internal Medicine

## 2013-08-24 ENCOUNTER — Encounter: Payer: BC Managed Care – PPO | Admitting: Internal Medicine

## 2013-08-24 MED ORDER — MOVIPREP 100 G PO SOLR: ORAL | Status: DC

## 2013-08-24 NOTE — Telephone Encounter (Signed)
Sent prep to pt's pharmacy

## 2013-08-26 ENCOUNTER — Ambulatory Visit (AMBULATORY_SURGERY_CENTER): Payer: BC Managed Care – PPO | Admitting: Internal Medicine

## 2013-08-26 ENCOUNTER — Encounter: Payer: Self-pay | Admitting: Internal Medicine

## 2013-08-26 ENCOUNTER — Encounter: Payer: BC Managed Care – PPO | Admitting: Internal Medicine

## 2013-08-26 ENCOUNTER — Telehealth: Payer: Self-pay | Admitting: *Deleted

## 2013-08-26 VITALS — BP 143/76 | HR 67 | Temp 98.1°F | Resp 17 | Ht 69.75 in | Wt 175.0 lb

## 2013-08-26 DIAGNOSIS — K2970 Gastritis, unspecified, without bleeding: Secondary | ICD-10-CM

## 2013-08-26 DIAGNOSIS — A048 Other specified bacterial intestinal infections: Secondary | ICD-10-CM

## 2013-08-26 DIAGNOSIS — K625 Hemorrhage of anus and rectum: Secondary | ICD-10-CM

## 2013-08-26 DIAGNOSIS — R1310 Dysphagia, unspecified: Secondary | ICD-10-CM

## 2013-08-26 DIAGNOSIS — Z800 Family history of malignant neoplasm of digestive organs: Secondary | ICD-10-CM

## 2013-08-26 MED ORDER — SODIUM CHLORIDE 0.9 % IV SOLN: 500.0000 mL | INTRAVENOUS | Status: DC

## 2013-08-26 NOTE — Op Note (Signed)
[facility] [address] [place], [address]   ENDOSCOPY PROCEDURE REPORT  PATIENT: [place], Revoir  MR#: [id number] BIRTHDATE: Feb 13, 1971 , 42  yrs. old GENDER: Male ENDOSCOPIST: Beverley Fiedler, MD PROCEDURE DATE:  08/26/2013 PROCEDURE:  EGD w/ biopsy for H.pylori ASA CLASS:     Class II INDICATIONS:  family history of gastric cancer, dysphagia. MEDICATIONS: MAC sedation, administered by CRNA and propofol (Diprivan) 100mg  IV TOPICAL ANESTHETIC: Cetacaine Spray  DESCRIPTION OF PROCEDURE: After the risks benefits and alternatives of the procedure were thoroughly explained, informed consent was obtained.  The [id number] endoscope was introduced through the mouth and advanced to the second portion of the duodenum. Without limitations.  The instrument was slowly withdrawn as the mucosa was fully examined.      ESOPHAGUS: The mucosa of the esophagus appeared normal.  STOMACH: Mild erosive gastritis (inflammation) was found in the gastric body.  Multiple biopsies were performed using cold forceps.   DUODENUM: The duodenal mucosa showed no abnormalities in the bulb and second portion of the duodenum.  Retroflexed views revealed no abnormalities.     The scope was then withdrawn from the patient and the procedure completed.  COMPLICATIONS: There were no complications.  ENDOSCOPIC IMPRESSION: 1.   The mucosa of the esophagus appeared normal 2.   Mild erosive gastritis (inflammation) was found in the gastric body; multiple biopsies 3.   The duodenal mucosa showed no abnormalities in the bulb and second portion of the duodenum  RECOMMENDATIONS: 1.  Await pathology results 2.  Follow-up of helicobacter pylori status, treat if indicated  eSigned:  Beverley Fiedler, MD 08/26/2013 3:47 PM  CC:The Patient, [name], MD, and Eli Hose, MD

## 2013-08-26 NOTE — Progress Notes (Signed)
Lidocaine-40mg IV prior to Propofol InductionPropofol given over incremental dosages 

## 2013-08-26 NOTE — Progress Notes (Signed)
Called to room to assist during endoscopic procedure.  Patient ID and intended procedure confirmed with present staff. Received instructions for my participation in the procedure from the performing physician. ewm 

## 2013-08-26 NOTE — Patient Instructions (Addendum)

## 2013-08-26 NOTE — Telephone Encounter (Signed)
Message copied by Florene Glen on Thu Aug 26, 2013 10:28 AM ------      Message from: Benjiman Core      Created: Thu Aug 26, 2013 10:06 AM       I did not refer him, his PCP did.      As for his his platelets: his count of 48 K is adequate to have a biopsy and no need for any medications (DDAVP etc..).       ----- Message -----         From: Linna Hoff, RN         Sent: 08/26/2013   9:35 AM           To: Benjiman Core, MD            Dr Rhea Belton is doing and EGD and COLON on pt today. Pt with hx of thrombocytopenia and you referred him to [place]; ok to do biopsies if needed or does pt need DDAVP or something? Thanks , Aram Beecham or [employer]       ------

## 2013-08-26 NOTE — Progress Notes (Signed)
No complaints noted in the recovery room. Maw   

## 2013-08-26 NOTE — Op Note (Signed)
[facility] [address] [place], [address]   COLONOSCOPY PROCEDURE REPORT  PATIENT: Joseph Lindsey, Joseph Lindsey  MR#: [id number] BIRTHDATE: Nov 01, 1971 , 42  yrs. old GENDER: Male ENDOSCOPIST: Beverley Fiedler, MD REFERRED XB:JYNWG Caryl Never, M.D. PROCEDURE DATE:  08/26/2013 PROCEDURE:   Colonoscopy, diagnostic First Screening Colonoscopy - Avg.  risk and is 50 yrs.  old or older - No.  Prior Negative Screening - Now for repeat screening. N/A  History of Adenoma - Now for follow-up colonoscopy & has been > or = to 3 yrs.  N/A  Polyps Removed Today? No.  Recommend repeat exam, <10 yrs? No. ASA CLASS:   Class II INDICATIONS:Rectal Bleeding. MEDICATIONS: MAC sedation, administered by CRNA and propofol (Diprivan) 100mg  IV  DESCRIPTION OF PROCEDURE:   After the risks benefits and alternatives of the procedure were thoroughly explained, informed consent was obtained.  A digital rectal exam revealed no rectal mass.   The LB [id number]  endoscope was introduced through the anus and advanced to the cecum, which was identified by both the appendix and ileocecal valve. No adverse events experienced. The quality of the prep was good, using MoviPrep  The instrument was then slowly withdrawn as the colon was fully examined.   COLON FINDINGS: A normal appearing cecum, ileocecal valve, and appendiceal orifice were identified.  The ascending, hepatic flexure, transverse, splenic flexure, descending, sigmoid colon and rectum appeared unremarkable.  No polyps or cancers were seen. Retroflexed views revealed no abnormalities. The time to cecum=3 minutes 42 seconds.  Withdrawal time=8 minutes 14 seconds.  The scope was withdrawn and the procedure completed.  COMPLICATIONS: There were no complications.  ENDOSCOPIC IMPRESSION: Normal colon Expect bright red blood per rectum from anorectal source, though no hemorrhoid or fissure seen today  RECOMMENDATIONS: You should continue  to follow colorectal cancer screening guidelines for "routine risk" patients with a repeat colonoscopy in 10 years. There is no need for FOBT (stool) testing for at least 5 years.   eSigned:  Beverley Fiedler, MD 08/26/2013 3:51 PM   cc: The Patient, [name], MD, and Eli Hose, MD

## 2013-08-26 NOTE — Telephone Encounter (Signed)
Dr Rhea Belton, please see note. Thanks.

## 2013-08-27 ENCOUNTER — Telehealth: Payer: Self-pay | Admitting: *Deleted

## 2013-08-27 NOTE — Telephone Encounter (Signed)
  Follow up Call-  Call back number 08/26/2013  Post procedure Call Back phone  # [phone]  Permission to leave phone message Yes     Patient questions:  Do you have a fever, pain , or abdominal swelling? no Pain Score  0 *  Have you tolerated food without any problems? yes  Have you been able to return to your normal activities? yes  Do you have any questions about your discharge instructions: Diet   no Medications  no Follow up visit  no  Do you have questions or concerns about your Care? no  Actions: * If pain score is 4 or above: No action needed, pain <4.

## 2013-09-01 ENCOUNTER — Encounter: Payer: Self-pay | Admitting: Internal Medicine

## 2013-09-03 ENCOUNTER — Telehealth: Payer: Self-pay | Admitting: *Deleted

## 2013-09-03 MED ORDER — BIS SUBCIT-METRONID-TETRACYC 140-125-125 MG PO CAPS: 3.0000 | ORAL_CAPSULE | Freq: Three times a day (TID) | ORAL | Status: DC

## 2013-09-03 MED ORDER — OMEPRAZOLE 40 MG PO CPDR: DELAYED_RELEASE_CAPSULE | ORAL | Status: DC

## 2013-09-03 NOTE — Telephone Encounter (Signed)
Message copied by Florene Glen on Fri Sep 03, 2013  3:46 PM ------      Message from: Beverley Fiedler      Created: Wed Sep 01, 2013 12:35 PM       H. pylori positive please treat with Pylera and twice daily PPI      Confirm eradication with stool antigen or breath test in 2 months off PPI ------

## 2013-09-22 ENCOUNTER — Telehealth: Payer: Self-pay | Admitting: Internal Medicine

## 2013-09-23 NOTE — Telephone Encounter (Signed)
Pt started Pylera on 09/07/13. He must wait 2 months after completion, so I informed him I will call him on 11/08/13 to order the test. Pt stated understanding.

## 2013-10-14 ENCOUNTER — Other Ambulatory Visit: Payer: Self-pay

## 2013-10-25 ENCOUNTER — Other Ambulatory Visit: Payer: Self-pay | Admitting: Family Medicine

## 2013-11-09 ENCOUNTER — Telehealth: Payer: Self-pay | Admitting: *Deleted

## 2013-11-09 NOTE — Telephone Encounter (Signed)
Scheduled pt for Breath Test on 12//4/14 and emailed his prep instructions.

## 2013-11-09 NOTE — Telephone Encounter (Signed)
Message copied by Florene Glen on Tue Nov 09, 2013  3:36 PM ------      Message from: Florene Glen      Created: Thu Sep 23, 2013  8:46 AM       Call pt to order either breath test or stool antigen post Pylera tx ------

## 2013-11-11 ENCOUNTER — Encounter (HOSPITAL_COMMUNITY)
Admission: RE | Disposition: A | Discharge status: Home / Self Care | Payer: Self-pay | Source: Ambulatory Visit | Attending: Internal Medicine

## 2013-11-11 ENCOUNTER — Ambulatory Visit (HOSPITAL_COMMUNITY)
Admission: RE | Admit: 2013-11-11 | Discharge: 2013-11-11 | Disposition: A | Discharge status: Home / Self Care | Payer: BC Managed Care – PPO | Source: Ambulatory Visit | Attending: Internal Medicine | Admitting: Internal Medicine

## 2013-11-11 DIAGNOSIS — Z8719 Personal history of other diseases of the digestive system: Secondary | ICD-10-CM | POA: Insufficient documentation

## 2013-11-11 HISTORY — PX: BREATH TEK H PYLORI: SHX5422

## 2013-11-11 SURGERY — BREATH TEST, FOR HELICOBACTER PYLORI

## 2013-11-12 ENCOUNTER — Encounter (HOSPITAL_COMMUNITY): Payer: Self-pay | Admitting: Internal Medicine

## 2013-12-08 ENCOUNTER — Other Ambulatory Visit: Payer: Self-pay | Admitting: Family Medicine

## 2013-12-16 ENCOUNTER — Ambulatory Visit (HOSPITAL_BASED_OUTPATIENT_CLINIC_OR_DEPARTMENT_OTHER): Payer: BC Managed Care – PPO | Admitting: Oncology

## 2013-12-16 ENCOUNTER — Telehealth: Payer: Self-pay | Admitting: Oncology

## 2013-12-16 ENCOUNTER — Other Ambulatory Visit (HOSPITAL_BASED_OUTPATIENT_CLINIC_OR_DEPARTMENT_OTHER): Payer: BC Managed Care – PPO

## 2013-12-16 VITALS — BP 115/74 | HR 58 | Temp 97.3°F | Resp 18 | Ht 69.0 in | Wt 182.1 lb

## 2013-12-16 DIAGNOSIS — D696 Thrombocytopenia, unspecified: Secondary | ICD-10-CM

## 2013-12-16 DIAGNOSIS — R1310 Dysphagia, unspecified: Secondary | ICD-10-CM

## 2013-12-16 LAB — COMPREHENSIVE METABOLIC PANEL (CC13)
ALK PHOS: 95 U/L (ref 40–150)
ALT: 21 U/L (ref 0–55)
ANION GAP: 8 meq/L (ref 3–11)
AST: 19 U/L (ref 5–34)
Albumin: 3.6 g/dL (ref 3.5–5.0)
BUN: 14.4 mg/dL (ref 7.0–26.0)
CO2: 25 meq/L (ref 22–29)
CREATININE: 1.2 mg/dL (ref 0.7–1.3)
Calcium: 9.2 mg/dL (ref 8.4–10.4)
Chloride: 109 mEq/L (ref 98–109)
Glucose: 96 mg/dl (ref 70–140)
Potassium: 4.4 mEq/L (ref 3.5–5.1)
SODIUM: 142 meq/L (ref 136–145)
TOTAL PROTEIN: 6.8 g/dL (ref 6.4–8.3)
Total Bilirubin: 0.25 mg/dL (ref 0.20–1.20)

## 2013-12-16 LAB — CBC WITH DIFFERENTIAL/PLATELET
BASO%: 0.5 % (ref 0.0–2.0)
Basophils Absolute: 0 10*3/uL (ref 0.0–0.1)
EOS ABS: 0.2 10*3/uL (ref 0.0–0.5)
EOS%: 2.6 % (ref 0.0–7.0)
HCT: 46.3 % (ref 38.4–49.9)
HGB: 15.3 g/dL (ref 13.0–17.1)
LYMPH%: 43.4 % (ref 14.0–49.0)
MCH: 27.5 pg (ref 27.2–33.4)
MCHC: 33 g/dL (ref 32.0–36.0)
MCV: 83.1 fL (ref 79.3–98.0)
MONO#: 0.6 10*3/uL (ref 0.1–0.9)
MONO%: 9.8 % (ref 0.0–14.0)
NEUT%: 43.7 % (ref 39.0–75.0)
NEUTROS ABS: 2.5 10*3/uL (ref 1.5–6.5)
PLATELETS: 73 10*3/uL — AB (ref 140–400)
RBC: 5.57 10*6/uL (ref 4.20–5.82)
RDW: 14.2 % (ref 11.0–14.6)
WBC: 5.7 10*3/uL (ref 4.0–10.3)
lymph#: 2.5 10*3/uL (ref 0.9–3.3)
nRBC: 0 % (ref 0–0)

## 2013-12-16 NOTE — Progress Notes (Signed)
Hematology and Oncology Follow Up Visit  Joseph Lindsey [id number] [birth year] 43 y.o. 12/16/2013 10:23 AM  CC: [name], M.D.    Principle Diagnosis: This is a 43 year old gentleman with thrombocytopenia etiologies  Including autoimmune thrombocytopenia such as idiopathic thrombocytopenic purpura vs. a platelet function abnormality such as Bernard-Soulier or May-Hegglin anomaly.  Current therapy: Observation and  surveillance.  Interim History:   [name] is a pleasant gentleman from [place] we have been following since July 2011 with platelet count between 50,000 and 60,000.  His workup has been really unrevealing at this point.  His abdominal ultrasound did not really show any evidence of any splenomegaly in 2011. Since his last visit, he did not report really any active bleeding.  Did not report any hematochezia.  Did not report any melena, did not report any epistaxis.  Did report some occasional easy bruisability.  He did not report any chest pain or difficulty breathing.  He had not reported any major changes in his performance status or activity level.   Medications: I have reviewed the patient's current medications. Current Outpatient Prescriptions  Medication Sig Dispense Refill  . bismuth-metronidazole-tetracycline (PYLERA) 140-125-125 MG per capsule Take 3 capsules by mouth 4 (four) times daily -  before meals and at bedtime.  120 capsule  0  . levothyroxine (SYNTHROID, LEVOTHROID) 75 MCG tablet TAKE 1 TABLET DAILY  90 tablet  3  . levothyroxine (SYNTHROID, LEVOTHROID) 75 MCG tablet TAKE 1 TABLET DAILY  90 tablet  2  . omeprazole (PRILOSEC) 40 MG capsule Take one capsule twice daily by mouth while on Pylera  20 capsule  0   No current facility-administered medications for this visit.    Allergies: No Known Allergies  Past Medical History, Surgical history, Social history, and Family History were reviewed and updated.  Review of Systems: Constitutional:   Negative for fever, chills, night sweats, anorexia, weight loss, pain.  Remaining ROS negative. Physical Exam: Blood pressure 115/74, pulse 58, temperature 97.3 F (36.3 C), temperature source Oral, resp. rate 18, height 5\' 9"  (1.753 m), weight 182 lb 1.6 oz (82.6 kg), SpO2 100.00%. ECOG: 0 General appearance: alert Head: Normocephalic, without obvious abnormality, atraumatic Neck: no adenopathy, no carotid bruit, no JVD, supple, symmetrical, trachea midline and thyroid not enlarged, symmetric, no tenderness/mass/nodules Lymph nodes: Cervical, supraclavicular, and axillary nodes normal. Heart:regular rate and rhythm, S1, S2 normal, no murmur, click, rub or gallop Lung:chest clear, no wheezing, rales, normal symmetric air entry Abdomin: soft, non-tender, without masses or organomegaly EXT:no erythema, induration, or nodules   Lab Results: Lab Results  Component Value Date   WBC 5.7 12/16/2013   HGB 15.3 12/16/2013   HCT 46.3 12/16/2013   MCV 83.1 12/16/2013   PLT 73* 12/16/2013     Chemistry      Component Value Date/Time   NA 140 06/15/2013 1203   NA 139 05/06/2013 1241   K 3.9 06/15/2013 1203   K 4.2 05/06/2013 1241   CL 104 05/06/2013 1241   CL 106 12/15/2012 0948   CO2 27 06/15/2013 1203   CO2 26 05/06/2013 1241   BUN 14.6 06/15/2013 1203   BUN 15 05/06/2013 1241   CREATININE 1.3 06/15/2013 1203   CREATININE 1.1 05/06/2013 1241      Component Value Date/Time   CALCIUM 9.6 06/15/2013 1203   CALCIUM 9.1 05/06/2013 1241   ALKPHOS 68 06/15/2013 1203   ALKPHOS 61 05/06/2013 1241   AST 26 06/15/2013 1203   AST 27 05/06/2013 1241  ALT 29 06/15/2013 1203   ALT 32 05/06/2013 1241   BILITOT 0.51 06/15/2013 1203   BILITOT 0.6 05/06/2013 1241      Impression and Plan: This is a pleasant, 43 year old gentleman with the following issues:  1. Thrombocytopenia. The differential diagnosis including autoimmune versus medication, versus a platelet function abnormality such as Bernard-Soulier or May-Hegglin anomaly.   Again, his peripheral smear showed rather giant platelets at this point.  His platelet is 71,000.  He has really no bleeding.  So we are going to hold off on any steroid or IVIG intervention unless he is bleeding or having surgical procedure.   2. Recent family history diagnosis of gastric cancer: He is asymptomatic at this point. CT scan in 06/22/2013 did not show any abnormalities. 3. Followup: He'll be in 6 months sooner if needed to.   [facility], MD 1/8/201510:23 AM

## 2013-12-16 NOTE — Telephone Encounter (Signed)
gv and printed appt sched and avs for pt for July 2015 °

## 2013-12-17 ENCOUNTER — Encounter: Payer: Self-pay | Admitting: Oncology

## 2014-06-15 ENCOUNTER — Encounter: Payer: Self-pay | Admitting: Oncology

## 2014-06-15 ENCOUNTER — Ambulatory Visit (HOSPITAL_BASED_OUTPATIENT_CLINIC_OR_DEPARTMENT_OTHER): Payer: BC Managed Care – PPO | Admitting: Oncology

## 2014-06-15 ENCOUNTER — Telehealth: Payer: Self-pay | Admitting: Oncology

## 2014-06-15 ENCOUNTER — Other Ambulatory Visit (HOSPITAL_BASED_OUTPATIENT_CLINIC_OR_DEPARTMENT_OTHER): Payer: BC Managed Care – PPO

## 2014-06-15 VITALS — BP 121/69 | HR 56 | Temp 98.2°F | Resp 19 | Ht 69.0 in | Wt 174.0 lb

## 2014-06-15 DIAGNOSIS — D696 Thrombocytopenia, unspecified: Secondary | ICD-10-CM

## 2014-06-15 LAB — COMPREHENSIVE METABOLIC PANEL (CC13)
ALT: 27 U/L (ref 0–55)
ANION GAP: 8 meq/L (ref 3–11)
AST: 23 U/L (ref 5–34)
Albumin: 3.9 g/dL (ref 3.5–5.0)
Alkaline Phosphatase: 64 U/L (ref 40–150)
BUN: 11.7 mg/dL (ref 7.0–26.0)
CO2: 27 meq/L (ref 22–29)
Calcium: 9.4 mg/dL (ref 8.4–10.4)
Chloride: 104 mEq/L (ref 98–109)
Creatinine: 1.2 mg/dL (ref 0.7–1.3)
Glucose: 86 mg/dl (ref 70–140)
Potassium: 3.4 mEq/L — ABNORMAL LOW (ref 3.5–5.1)
Sodium: 139 mEq/L (ref 136–145)
TOTAL PROTEIN: 7 g/dL (ref 6.4–8.3)
Total Bilirubin: 0.59 mg/dL (ref 0.20–1.20)

## 2014-06-15 LAB — CBC WITH DIFFERENTIAL/PLATELET
BASO%: 0.2 % (ref 0.0–2.0)
Basophils Absolute: 0 10*3/uL (ref 0.0–0.1)
EOS%: 1.3 % (ref 0.0–7.0)
Eosinophils Absolute: 0.1 10*3/uL (ref 0.0–0.5)
HEMATOCRIT: 44.8 % (ref 38.4–49.9)
HEMOGLOBIN: 15.3 g/dL (ref 13.0–17.1)
LYMPH%: 42.4 % (ref 14.0–49.0)
MCH: 27.9 pg (ref 27.2–33.4)
MCHC: 34.2 g/dL (ref 32.0–36.0)
MCV: 81.6 fL (ref 79.3–98.0)
MONO#: 0.5 10*3/uL (ref 0.1–0.9)
MONO%: 8.5 % (ref 0.0–14.0)
NEUT#: 2.6 10*3/uL (ref 1.5–6.5)
NEUT%: 47.6 % (ref 39.0–75.0)
PLATELETS: 75 10*3/uL — AB (ref 140–400)
RBC: 5.49 10*6/uL (ref 4.20–5.82)
RDW: 14.3 % (ref 11.0–14.6)
WBC: 5.4 10*3/uL (ref 4.0–10.3)
lymph#: 2.3 10*3/uL (ref 0.9–3.3)
nRBC: 0 % (ref 0–0)

## 2014-06-15 NOTE — Telephone Encounter (Signed)
Gave pt appt for lab and MD on January 2016 °

## 2014-06-15 NOTE — Progress Notes (Signed)
Hematology and Oncology Follow Up Visit  Joseph DresserWilliam C Lindsey [id number] 09-27-1971 43 y.o. 06/15/2014 10:44 AM  CC: [name], M.D.    Principle Diagnosis: This is a 43 year old gentleman with thrombocytopenia etiologies  Including autoimmune thrombocytopenia such as idiopathic thrombocytopenic purpura vs. a platelet function abnormality such as Bernard-Soulier or May-Hegglin anomaly. His workup did not reveal any malignancy or splenomegaly. This is diagnosis 2011.  Current therapy: Observation and  surveillance.  Interim History:   Joseph Lindsey this today for a followup visit. Since his last visit, he did not report really any active bleeding.  Did not report any hematochezia.  Did not report any melena, did not report any epistaxis.  Did report some occasional easy bruisability.  He did not report any chest pain or difficulty breathing.  He had not reported any major changes in his performance status or activity level. He was diagnosed with H. pylori and have an endoscopy to rule out any malignancy or ulcers. He does not report any fevers or chills or sweats. Is not reporting any weight loss or appetite changes. Did not report any frequency urgency or hesitancy. As I put any skeletal complaints. Spent for any arthralgias or myalgias. The review of systems otherwise unremarkable. Medications: I have reviewed the patient's current medications. Current Outpatient Prescriptions  Medication Sig Dispense Refill  . bismuth-metronidazole-tetracycline (PYLERA) 140-125-125 MG per capsule Take 3 capsules by mouth 4 (four) times daily -  before meals and at bedtime.  120 capsule  0  . levothyroxine (SYNTHROID, LEVOTHROID) 75 MCG tablet TAKE 1 TABLET DAILY  90 tablet  3  . levothyroxine (SYNTHROID, LEVOTHROID) 75 MCG tablet TAKE 1 TABLET DAILY  90 tablet  2  . omeprazole (PRILOSEC) 40 MG capsule Take one capsule twice daily by mouth while on Pylera  20 capsule  0   No current facility-administered  medications for this visit.    Allergies: No Known Allergies  Past Medical History, Surgical history, Social history, and Family History were reviewed and updated.   Physical Exam: Blood pressure 121/69, pulse 56, temperature 98.2 F (36.8 C), temperature source Oral, resp. rate 19, height 5\' 9"  (1.753 m), weight 174 lb (78.926 kg), SpO2 100.00%. ECOG: 0 General appearance: alert Head: Normocephalic, without obvious abnormality, atraumatic Neck: no adenopathy Lymph nodes: Cervical, supraclavicular, and axillary nodes normal. Heart:regular rate and rhythm, S1, S2 normal, no murmur, click, rub or gallop Lung:chest clear, no wheezing, rales, normal symmetric air entry Abdomin: soft, non-tender, without masses or organomegaly EXT:no erythema, induration, or nodules   Lab Results: Lab Results  Component Value Date   WBC 5.4 06/15/2014   HGB 15.3 06/15/2014   HCT 44.8 06/15/2014   MCV 81.6 06/15/2014   PLT 75* 06/15/2014     Chemistry      Component Value Date/Time   NA 142 12/16/2013 0945   NA 139 05/06/2013 1241   K 4.4 12/16/2013 0945   K 4.2 05/06/2013 1241   CL 104 05/06/2013 1241   CL 106 12/15/2012 0948   CO2 25 12/16/2013 0945   CO2 26 05/06/2013 1241   BUN 14.4 12/16/2013 0945   BUN 15 05/06/2013 1241   CREATININE 1.2 12/16/2013 0945   CREATININE 1.1 05/06/2013 1241      Component Value Date/Time   CALCIUM 9.2 12/16/2013 0945   CALCIUM 9.1 05/06/2013 1241   ALKPHOS 95 12/16/2013 0945   ALKPHOS 61 05/06/2013 1241   AST 19 12/16/2013 0945   AST 27 05/06/2013 1241   ALT  21 12/16/2013 0945   ALT 32 05/06/2013 1241   BILITOT 0.25 12/16/2013 0945   BILITOT 0.6 05/06/2013 1241      Impression and Plan: This is a pleasant, 43 year old gentleman with the following issues:  1. Thrombocytopenia. The differential diagnosis including autoimmune versus medication, versus a platelet function abnormality such as Bernard-Soulier or May-Hegglin anomaly.  His platelet count is more than adequate without any  bleeding at this time. We'll continue active surveillance without any need for intervention. We can try steroids or IVIG in the future if needed to.  2. Recent family history diagnosis of gastric cancer: He is asymptomatic at this point. CT scan in 06/22/2013 did not show any abnormalities. 3. Followup: He'll be in 6 months sooner if needed to.   [facility], MD 7/8/201510:44 AM

## 2014-08-23 ENCOUNTER — Other Ambulatory Visit (INDEPENDENT_AMBULATORY_CARE_PROVIDER_SITE_OTHER): Payer: BC Managed Care – PPO

## 2014-08-23 DIAGNOSIS — Z0000 Encounter for general adult medical examination without abnormal findings: Secondary | ICD-10-CM

## 2014-08-23 LAB — BASIC METABOLIC PANEL
BUN: 13 mg/dL (ref 6–23)
CALCIUM: 9.2 mg/dL (ref 8.4–10.5)
CHLORIDE: 103 meq/L (ref 96–112)
CO2: 28 meq/L (ref 19–32)
CREATININE: 1.2 mg/dL (ref 0.4–1.5)
GFR: 87.76 mL/min (ref >60.00)
GLUCOSE: 87 mg/dL (ref 70–99)
Potassium: 4.1 mEq/L (ref 3.5–5.1)
Sodium: 138 mEq/L (ref 135–145)

## 2014-08-23 LAB — POCT URINALYSIS DIPSTICK
Bilirubin, UA: NEGATIVE
Glucose, UA: NEGATIVE
KETONES UA: NEGATIVE
Leukocytes, UA: NEGATIVE
Nitrite, UA: NEGATIVE
PH UA: 6.5
PROTEIN UA: NEGATIVE
RBC UA: NEGATIVE
SPEC GRAV UA: 1.015
UROBILINOGEN UA: 0.2

## 2014-08-23 LAB — HEPATIC FUNCTION PANEL
ALT: 23 U/L (ref 0–53)
AST: 27 U/L (ref 0–37)
Albumin: 3.8 g/dL (ref 3.5–5.2)
Alkaline Phosphatase: 59 U/L (ref 39–117)
BILIRUBIN DIRECT: 0.1 mg/dL (ref 0.0–0.3)
BILIRUBIN TOTAL: 0.6 mg/dL (ref 0.2–1.2)
Total Protein: 6.8 g/dL (ref 6.0–8.3)

## 2014-08-23 LAB — LIPID PANEL
CHOLESTEROL: 232 mg/dL — AB (ref 0–200)
HDL: 48.4 mg/dL (ref >39.00)
LDL CALC: 169 mg/dL — AB (ref 0–99)
NONHDL: 183.6
Total CHOL/HDL Ratio: 5
Triglycerides: 74 mg/dL (ref 0.0–149.0)
VLDL: 14.8 mg/dL (ref 0.0–40.0)

## 2014-08-23 LAB — PSA: PSA: 0.36 ng/mL (ref 0.10–4.00)

## 2014-08-23 LAB — TSH: TSH: 8.37 u[IU]/mL — AB (ref 0.35–4.50)

## 2014-08-25 LAB — CBC WITH DIFFERENTIAL/PLATELET
BASOS PCT: 0.4 % (ref 0.0–3.0)
Basophils Absolute: 0 10*3/uL (ref 0.0–0.1)
Eosinophils Absolute: 0.1 10*3/uL (ref 0.0–0.7)
Eosinophils Relative: 1.6 % (ref 0.0–5.0)
HCT: 45.4 % (ref 39.0–52.0)
HEMOGLOBIN: 15.1 g/dL (ref 13.0–17.0)
LYMPHS PCT: 43.3 % (ref 12.0–46.0)
Lymphs Abs: 2.1 10*3/uL (ref 0.7–4.0)
MCHC: 33.2 g/dL (ref 30.0–36.0)
MCV: 84.1 fl (ref 78.0–100.0)
MONOS PCT: 7.3 % (ref 3.0–12.0)
Monocytes Absolute: 0.4 10*3/uL (ref 0.1–1.0)
NEUTROS ABS: 2.3 10*3/uL (ref 1.4–7.7)
Neutrophils Relative %: 47.4 % (ref 43.0–77.0)
Platelets: 39 10*3/uL — CL (ref 150.0–400.0)
RBC: 5.4 Mil/uL (ref 4.22–5.81)
RDW: 14.1 % (ref 11.5–15.5)
WBC: 4.8 10*3/uL (ref 4.0–10.5)

## 2014-08-30 ENCOUNTER — Encounter: Payer: Self-pay | Admitting: Family Medicine

## 2014-08-30 ENCOUNTER — Ambulatory Visit (INDEPENDENT_AMBULATORY_CARE_PROVIDER_SITE_OTHER): Payer: BC Managed Care – PPO | Admitting: Family Medicine

## 2014-08-30 VITALS — BP 128/80 | HR 58 | Temp 97.5°F | Ht 69.0 in | Wt 179.0 lb

## 2014-08-30 DIAGNOSIS — Z23 Encounter for immunization: Secondary | ICD-10-CM

## 2014-08-30 DIAGNOSIS — E039 Hypothyroidism, unspecified: Secondary | ICD-10-CM

## 2014-08-30 DIAGNOSIS — Z0000 Encounter for general adult medical examination without abnormal findings: Secondary | ICD-10-CM

## 2014-08-30 MED ORDER — LEVOTHYROXINE SODIUM 88 MCG PO TABS: 88.0000 ug | ORAL_TABLET | Freq: Every day | ORAL | Status: DC

## 2014-08-30 NOTE — Addendum Note (Signed)
Addended by: Azucena Freed on: 08/30/2014 10:57 AM   Modules accepted: Orders

## 2014-08-30 NOTE — Progress Notes (Signed)
Pre visit review using our clinic review tool, if applicable. No additional management support is needed unless otherwise documented below in the visit note. 

## 2014-08-30 NOTE — Progress Notes (Signed)
Subjective:    Patient ID: Joseph Lindsey, male    DOB: 05-31-1971, 43 y.o.   MRN: [id number]  HPI Patient seen for complete physical. He has history of chronic thrombocytopenia which is probably autoimmune based and hypothyroidism. Nonsmoker. He takes levothyroxin 75 mcg once daily no other medications. He sees hematologist every 6 months for platelet count. Needs flu vaccine. Tetanus up to date. Family history reviewed and as below. He has no specific complaints today. No recent bleeding or bruising issues.  Past Medical History  Diagnosis Date  . Fissure, anal     2003  . Migraines   . Primary thrombocytopenia, unspecified   . Thyroid disease     hypothyroid   Past Surgical History  Procedure Laterality Date  . Breath tek h pylori N/A 11/11/2013    Procedure: BREATH TEK H PYLORI;  Surgeon: Beverley Fiedler, MD;  Location: [facility];  Service: Gastroenterology;  Laterality: N/A;    reports that he has never smoked. He has never used smokeless tobacco. He reports that he drinks alcohol. He reports that he does not use illicit drugs. family history includes Asthma in his brother, father, and sister; Cancer (age of onset: 21) in his brother; Diabetes in his father and sister; Hypertension in his father; Sickle cell trait in an other family member; Stomach cancer in his brother. There is no history of Colon cancer, Esophageal cancer, or Rectal cancer. No Known Allergies    Review of Systems  Constitutional: Negative for fever, activity change, appetite change and fatigue.  HENT: Negative for congestion, ear pain and trouble swallowing.   Eyes: Negative for pain and visual disturbance.  Respiratory: Negative for cough, shortness of breath and wheezing.   Cardiovascular: Negative for chest pain and palpitations.  Gastrointestinal: Negative for nausea, vomiting, abdominal pain, diarrhea, constipation, blood in stool, abdominal distention and rectal pain.  Genitourinary: Negative for  dysuria, hematuria and testicular pain.  Musculoskeletal: Negative for arthralgias and joint swelling.  Skin: Negative for rash.  Neurological: Negative for dizziness, syncope and headaches.  Hematological: Negative for adenopathy. Does not bruise/bleed easily.  Psychiatric/Behavioral: Negative for confusion and dysphoric mood.       Objective:   Physical Exam  Constitutional: He is oriented to person, place, and time. He appears well-developed and well-nourished. No distress.  HENT:  Head: Normocephalic and atraumatic.  Right Ear: External ear normal.  Left Ear: External ear normal.  Mouth/Throat: Oropharynx is clear and moist.  Eyes: Conjunctivae and EOM are normal. Pupils are equal, round, and reactive to light.  Neck: Normal range of motion. Neck supple. No thyromegaly present.  Cardiovascular: Normal rate, regular rhythm and normal heart sounds.   No murmur heard. Pulmonary/Chest: No respiratory distress. He has no wheezes. He has no rales.  Abdominal: Soft. Bowel sounds are normal. He exhibits no distension and no mass. There is no tenderness. There is no rebound and no guarding.  Musculoskeletal: He exhibits no edema.  Lymphadenopathy:    He has no cervical adenopathy.  Neurological: He is alert and oriented to person, place, and time. He displays normal reflexes. No cranial nerve deficit.  Skin: No rash noted.  Psychiatric: He has a normal mood and affect.          Assessment & Plan:  Complete physical. Flu vaccine given. Labs reviewed. His lipids are up but overall low risk for CAD or peripheral vascular disease. Dietary factors discussed. Thyroid suboptimally treated. Increase levothyroxin 88 mcg once daily and recheck  TSH 3 months

## 2014-08-30 NOTE — Patient Instructions (Signed)
Fat and Cholesterol Control Diet Fat and cholesterol levels in your blood and organs are influenced by your diet. High levels of fat and cholesterol may lead to diseases of the heart, small and large blood vessels, gallbladder, liver, and pancreas. CONTROLLING FAT AND CHOLESTEROL WITH DIET Although exercise and lifestyle factors are important, your diet is key. That is because certain foods are known to raise cholesterol and others to lower it. The goal is to balance foods for their effect on cholesterol and more importantly, to replace saturated and trans fat with other types of fat, such as monounsaturated fat, polyunsaturated fat, and omega-3 fatty acids. On average, a person should consume no more than 15 to 17 g of saturated fat daily. Saturated and trans fats are considered "bad" fats, and they will raise LDL cholesterol. Saturated fats are primarily found in animal products such as meats, butter, and cream. However, that does not mean you need to give up all your favorite foods. Today, there are good tasting, low-fat, low-cholesterol substitutes for most of the things you like to eat. Choose low-fat or nonfat alternatives. Choose round or loin cuts of red meat. These types of cuts are lowest in fat and cholesterol. Chicken (without the skin), fish, veal, and ground turkey breast are great choices. Eliminate fatty meats, such as hot dogs and salami. Even shellfish have little or no saturated fat. Have a 3 oz (85 g) portion when you eat lean meat, poultry, or fish. Trans fats are also called "partially hydrogenated oils." They are oils that have been scientifically manipulated so that they are solid at room temperature resulting in a longer shelf life and improved taste and texture of foods in which they are added. Trans fats are found in stick margarine, some tub margarines, cookies, crackers, and baked goods.  When baking and cooking, oils are a great substitute for butter. The monounsaturated oils are  especially beneficial since it is believed they lower LDL and raise HDL. The oils you should avoid entirely are saturated tropical oils, such as coconut and palm.  Remember to eat a lot from food groups that are naturally free of saturated and trans fat, including fish, fruit, vegetables, beans, grains (barley, rice, couscous, bulgur wheat), and pasta (without cream sauces).  IDENTIFYING FOODS THAT LOWER FAT AND CHOLESTEROL  Soluble fiber may lower your cholesterol. This type of fiber is found in fruits such as apples, vegetables such as broccoli, potatoes, and carrots, legumes such as beans, peas, and lentils, and grains such as barley. Foods fortified with plant sterols (phytosterol) may also lower cholesterol. You should eat at least 2 g per day of these foods for a cholesterol lowering effect.  Read package labels to identify low-saturated fats, trans fat free, and low-fat foods at the supermarket. Select cheeses that have only 2 to 3 g saturated fat per ounce. Use a heart-healthy tub margarine that is free of trans fats or partially hydrogenated oil. When buying baked goods (cookies, crackers), avoid partially hydrogenated oils. Breads and muffins should be made from whole grains (whole-wheat or whole oat flour, instead of "flour" or "enriched flour"). Buy non-creamy canned soups with reduced salt and no added fats.  FOOD PREPARATION TECHNIQUES  Never deep-fry. If you must fry, either stir-fry, which uses very little fat, or use non-stick cooking sprays. When possible, broil, bake, or roast meats, and steam vegetables. Instead of putting butter or margarine on vegetables, use lemon and herbs, applesauce, and cinnamon (for squash and sweet potatoes). Use nonfat   yogurt, salsa, and low-fat dressings for salads.  LOW-SATURATED FAT / LOW-FAT FOOD SUBSTITUTES Meats / Saturated Fat (g)  Avoid: Steak, marbled (3 oz/85 g) / 11 g  Choose: Steak, lean (3 oz/85 g) / 4 g  Avoid: Hamburger (3 oz/85 g) / 7  g  Choose: Hamburger, lean (3 oz/85 g) / 5 g  Avoid: Ham (3 oz/85 g) / 6 g  Choose: Ham, lean cut (3 oz/85 g) / 2.4 g  Avoid: Chicken, with skin, dark meat (3 oz/85 g) / 4 g  Choose: Chicken, skin removed, dark meat (3 oz/85 g) / 2 g  Avoid: Chicken, with skin, light meat (3 oz/85 g) / 2.5 g  Choose: Chicken, skin removed, light meat (3 oz/85 g) / 1 g Dairy / Saturated Fat (g)  Avoid: Whole milk (1 cup) / 5 g  Choose: Low-fat milk, 2% (1 cup) / 3 g  Choose: Low-fat milk, 1% (1 cup) / 1.5 g  Choose: Skim milk (1 cup) / 0.3 g  Avoid: Hard cheese (1 oz/28 g) / 6 g  Choose: Skim milk cheese (1 oz/28 g) / 2 to 3 g  Avoid: Cottage cheese, 4% fat (1 cup) / 6.5 g  Choose: Low-fat cottage cheese, 1% fat (1 cup) / 1.5 g  Avoid: Ice cream (1 cup) / 9 g  Choose: Sherbet (1 cup) / 2.5 g  Choose: Nonfat frozen yogurt (1 cup) / 0.3 g  Choose: Frozen fruit bar / trace  Avoid: Whipped cream (1 tbs) / 3.5 g  Choose: Nondairy whipped topping (1 tbs) / 1 g Condiments / Saturated Fat (g)  Avoid: Mayonnaise (1 tbs) / 2 g  Choose: Low-fat mayonnaise (1 tbs) / 1 g  Avoid: Butter (1 tbs) / 7 g  Choose: Extra light margarine (1 tbs) / 1 g  Avoid: Coconut oil (1 tbs) / 11.8 g  Choose: Olive oil (1 tbs) / 1.8 g  Choose: Corn oil (1 tbs) / 1.7 g  Choose: Safflower oil (1 tbs) / 1.2 g  Choose: Sunflower oil (1 tbs) / 1.4 g  Choose: Soybean oil (1 tbs) / 2.4 g  Choose: Canola oil (1 tbs) / 1 g Document Released: 11/25/2005 Document Revised: 03/22/2013 Document Reviewed: 02/23/2014 [employer] Patient Information 2015 [employer]. This information is not intended to replace advice given to you by your health care provider. Make sure you discuss any questions you have with your health care provider.  

## 2014-11-29 ENCOUNTER — Other Ambulatory Visit (INDEPENDENT_AMBULATORY_CARE_PROVIDER_SITE_OTHER): Payer: BC Managed Care – PPO

## 2014-11-29 DIAGNOSIS — E039 Hypothyroidism, unspecified: Secondary | ICD-10-CM

## 2014-11-29 LAB — TSH: TSH: 0.81 u[IU]/mL (ref 0.35–4.50)

## 2014-12-15 ENCOUNTER — Ambulatory Visit (HOSPITAL_BASED_OUTPATIENT_CLINIC_OR_DEPARTMENT_OTHER): Payer: BLUE CROSS/BLUE SHIELD | Admitting: Oncology

## 2014-12-15 ENCOUNTER — Telehealth: Payer: Self-pay | Admitting: Oncology

## 2014-12-15 ENCOUNTER — Other Ambulatory Visit (HOSPITAL_BASED_OUTPATIENT_CLINIC_OR_DEPARTMENT_OTHER): Payer: BLUE CROSS/BLUE SHIELD

## 2014-12-15 VITALS — BP 130/70 | HR 52 | Temp 98.0°F | Resp 18

## 2014-12-15 DIAGNOSIS — D696 Thrombocytopenia, unspecified: Secondary | ICD-10-CM

## 2014-12-15 DIAGNOSIS — Z808 Family history of malignant neoplasm of other organs or systems: Secondary | ICD-10-CM

## 2014-12-15 LAB — CBC WITH DIFFERENTIAL/PLATELET
BASO%: 0.4 % (ref 0.0–2.0)
Basophils Absolute: 0 10*3/uL (ref 0.0–0.1)
EOS ABS: 0.1 10*3/uL (ref 0.0–0.5)
EOS%: 1.3 % (ref 0.0–7.0)
HCT: 46.4 % (ref 38.4–49.9)
HGB: 15.6 g/dL (ref 13.0–17.1)
LYMPH%: 40.2 % (ref 14.0–49.0)
MCH: 27.6 pg (ref 27.2–33.4)
MCHC: 33.6 g/dL (ref 32.0–36.0)
MCV: 82 fL (ref 79.3–98.0)
MONO#: 0.4 10*3/uL (ref 0.1–0.9)
MONO%: 8.7 % (ref 0.0–14.0)
NEUT%: 49.4 % (ref 39.0–75.0)
NEUTROS ABS: 2.2 10*3/uL (ref 1.5–6.5)
NRBC: 0 % (ref 0–0)
PLATELETS: 78 10*3/uL — AB (ref 140–400)
RBC: 5.66 10*6/uL (ref 4.20–5.82)
RDW: 13.7 % (ref 11.0–14.6)
WBC: 4.5 10*3/uL (ref 4.0–10.3)
lymph#: 1.8 10*3/uL (ref 0.9–3.3)

## 2014-12-15 LAB — COMPREHENSIVE METABOLIC PANEL (CC13)
ALBUMIN: 4 g/dL (ref 3.5–5.0)
ALT: 32 U/L (ref 0–55)
AST: 28 U/L (ref 5–34)
Alkaline Phosphatase: 73 U/L (ref 40–150)
Anion Gap: 7 mEq/L (ref 3–11)
BUN: 12.4 mg/dL (ref 7.0–26.0)
CALCIUM: 9.6 mg/dL (ref 8.4–10.4)
CHLORIDE: 102 meq/L (ref 98–109)
CO2: 30 meq/L — AB (ref 22–29)
Creatinine: 1.2 mg/dL (ref 0.7–1.3)
EGFR: >90 mL/min/{1.73_m2} (ref >90)
Glucose: 85 mg/dl (ref 70–140)
POTASSIUM: 4.3 meq/L (ref 3.5–5.1)
Sodium: 139 mEq/L (ref 136–145)
Total Bilirubin: 0.47 mg/dL (ref 0.20–1.20)
Total Protein: 7.3 g/dL (ref 6.4–8.3)

## 2014-12-15 NOTE — Telephone Encounter (Signed)
LM to confirm appt for Sept. Mailed cal. °

## 2014-12-15 NOTE — Progress Notes (Signed)
Hematology and Oncology Follow Up Visit  Joseph Lindsey [id number] 11/03/1971 43 y.o. 12/15/2014 9:22 AM  CC: Joseph Lindsey, M.D.    Principle Diagnosis: This is a 44 year old gentleman with thrombocytopenia diagnosed in 2011. Etiologies  Including autoimmune thrombocytopenia such as idiopathic thrombocytopenic purpura vs. a platelet function abnormality such as Bernard-Soulier or May-Hegglin anomaly. His workup did not reveal any malignancy or splenomegaly.   Current therapy: Observation and  surveillance.  Interim History:   Joseph Lindsey this today for a followup visit. Since his last visit, he reports no new complaints. He did not report any active bleeding.  Did not report any hematochezia.  Did not report any melena, did not report any epistaxis.  Did report some occasional easy bruisability.  He does not report any constitutional symptoms such as fevers or chills or sweats. He does not report any weight loss or appetite changes. He did not report any chest pain or difficulty breathing.  He had not reported any major changes in his performance status or activity level. He does not report any chest pain, palpitation or leg edema. He does not report any cough or wheezing. He does not report any abdominal pain, constipation, diarrhea or distention. Did not report any frequency urgency or hesitancy. He reports no arthralgias or myalgias. The review of systems otherwise unremarkable.  Medications: I have reviewed the patient's current medications. Current Outpatient Prescriptions  Medication Sig Dispense Refill  . levothyroxine (SYNTHROID, LEVOTHROID) 88 MCG tablet Take 1 tablet (88 mcg total) by mouth daily. 90 tablet 3   No current facility-administered medications for this visit.    Allergies: No Known Allergies  Past Medical History, Surgical history, Social history, and Family History were reviewed and updated.   Physical Exam: Blood pressure 130/70, pulse 52, temperature 98 F  (36.7 C), temperature source Oral, resp. rate 18. ECOG: 0 General appearance: alert awake not in any distress. Head: Normocephalic, without obvious abnormality Neck: no adenopathy Lymph nodes: Cervical, supraclavicular, and axillary nodes normal. Heart:regular rate and rhythm, S1, S2 normal, no murmur, click, rub or gallop Lung:chest clear, no wheezing, rales, normal symmetric air entry Abdomin: soft, non-tender, without masses or organomegaly EXT:no erythema, induration, or nodules   Lab Results: Lab Results  Component Value Date   WBC 4.5 12/15/2014   HGB 15.6 12/15/2014   HCT 46.4 12/15/2014   MCV 82.0 12/15/2014   PLT 78* 12/15/2014     Chemistry      Component Value Date/Time   NA 138 08/23/2014 1018   NA 139 06/15/2014 1013   K 4.1 08/23/2014 1018   K 3.4* 06/15/2014 1013   CL 103 08/23/2014 1018   CL 106 12/15/2012 0948   CO2 28 08/23/2014 1018   CO2 27 06/15/2014 1013   BUN 13 08/23/2014 1018   BUN 11.7 06/15/2014 1013   CREATININE 1.2 08/23/2014 1018   CREATININE 1.2 06/15/2014 1013      Component Value Date/Time   CALCIUM 9.2 08/23/2014 1018   CALCIUM 9.4 06/15/2014 1013   ALKPHOS 59 08/23/2014 1018   ALKPHOS 64 06/15/2014 1013   AST 27 08/23/2014 1018   AST 23 06/15/2014 1013   ALT 23 08/23/2014 1018   ALT 27 06/15/2014 1013   BILITOT 0.6 08/23/2014 1018   BILITOT 0.59 06/15/2014 1013      Impression and Plan: This is a pleasant, 44 year old gentleman with the following issues:  1. Thrombocytopenia. The differential diagnosis including autoimmune versus medication, versus a platelet function abnormality such as  Bernard-Soulier or May-Hegglin anomaly.  His platelet count 78,000 which should be more than adequate for to prevent any spontaneous bleeding. I see no indications for treatment including transfusions or steroid trials. 2. Recent family history diagnosis of gastric cancer: He is asymptomatic at this point. CT scan in 06/22/2013 did not show any  abnormalities. 3. Followup: 8 months or sooner if needed to.   [facility],Christiona Siddique, MD 1/7/20169:22 AM

## 2015-08-17 ENCOUNTER — Other Ambulatory Visit: Payer: BLUE CROSS/BLUE SHIELD

## 2015-08-17 ENCOUNTER — Other Ambulatory Visit (HOSPITAL_BASED_OUTPATIENT_CLINIC_OR_DEPARTMENT_OTHER): Payer: BLUE CROSS/BLUE SHIELD

## 2015-08-17 ENCOUNTER — Telehealth: Payer: Self-pay | Admitting: Oncology

## 2015-08-17 ENCOUNTER — Ambulatory Visit: Payer: BLUE CROSS/BLUE SHIELD | Admitting: Oncology

## 2015-08-17 ENCOUNTER — Ambulatory Visit (HOSPITAL_BASED_OUTPATIENT_CLINIC_OR_DEPARTMENT_OTHER): Payer: BLUE CROSS/BLUE SHIELD | Admitting: Oncology

## 2015-08-17 VITALS — BP 119/50 | HR 52 | Temp 98.4°F | Resp 20 | Ht 69.0 in | Wt 177.2 lb

## 2015-08-17 DIAGNOSIS — D696 Thrombocytopenia, unspecified: Secondary | ICD-10-CM

## 2015-08-17 DIAGNOSIS — Z800 Family history of malignant neoplasm of digestive organs: Secondary | ICD-10-CM

## 2015-08-17 LAB — CBC WITH DIFFERENTIAL/PLATELET
BASO%: 0.3 % (ref 0.0–2.0)
Basophils Absolute: 0 10*3/uL (ref 0.0–0.1)
EOS ABS: 0.1 10*3/uL (ref 0.0–0.5)
EOS%: 1.4 % (ref 0.0–7.0)
HEMATOCRIT: 45.4 % (ref 38.4–49.9)
HGB: 15.5 g/dL (ref 13.0–17.1)
LYMPH%: 42.3 % (ref 14.0–49.0)
MCH: 28.1 pg (ref 27.2–33.4)
MCHC: 34.1 g/dL (ref 32.0–36.0)
MCV: 82.4 fL (ref 79.3–98.0)
MONO#: 0.3 10*3/uL (ref 0.1–0.9)
MONO%: 8.9 % (ref 0.0–14.0)
NEUT%: 47.1 % (ref 39.0–75.0)
NEUTROS ABS: 1.7 10*3/uL (ref 1.5–6.5)
Platelets: 62 10*3/uL — ABNORMAL LOW (ref 140–400)
RBC: 5.51 10*6/uL (ref 4.20–5.82)
RDW: 13.8 % (ref 11.0–14.6)
WBC: 3.7 10*3/uL — AB (ref 4.0–10.3)
lymph#: 1.6 10*3/uL (ref 0.9–3.3)
nRBC: 0 % (ref 0–0)

## 2015-08-17 LAB — COMPREHENSIVE METABOLIC PANEL (CC13)
ALT: 30 U/L (ref 0–55)
ANION GAP: 8 meq/L (ref 3–11)
AST: 24 U/L (ref 5–34)
Albumin: 3.9 g/dL (ref 3.5–5.0)
Alkaline Phosphatase: 67 U/L (ref 40–150)
BUN: 10.3 mg/dL (ref 7.0–26.0)
CALCIUM: 9.4 mg/dL (ref 8.4–10.4)
CO2: 26 meq/L (ref 22–29)
Chloride: 107 mEq/L (ref 98–109)
Creatinine: 1.2 mg/dL (ref 0.7–1.3)
EGFR: 88 mL/min/{1.73_m2} — AB (ref >90)
Glucose: 79 mg/dl (ref 70–140)
Potassium: 4.1 mEq/L (ref 3.5–5.1)
Sodium: 140 mEq/L (ref 136–145)
TOTAL PROTEIN: 6.8 g/dL (ref 6.4–8.3)
Total Bilirubin: 0.6 mg/dL (ref 0.20–1.20)

## 2015-08-17 NOTE — Progress Notes (Signed)
Hematology and Oncology Follow Up Visit  Joseph Lindsey [id number] 1971-06-28 44 y.o. 08/17/2015 11:37 AM  CC: Joseph Lindsey, M.D.    Principle Diagnosis: This is a 44 year old gentleman with thrombocytopenia diagnosed in 2011. Etiologies  Including autoimmune thrombocytopenia such as idiopathic thrombocytopenic purpura vs. a platelet function abnormality such as Bernard-Soulier or May-Hegglin anomaly. His workup did not reveal any malignancy or splenomegaly.   Current therapy: Observation and  surveillance.  Interim History:   Joseph Lindsey this today for a followup visit. Since his last visit, he continues to do well and completely asymptomatic. He is a very active gentleman currently works full time and goes to school. He did not report any active bleeding including hematochezia,melena, epistaxis or easy bruisability.    He does not report any constitutional symptoms such as fevers or chills or sweats. He does not report any weight loss or appetite changes. He did not report any chest pain or difficulty breathing.  He had not reported any major changes in his performance status or activity level. He does not report any chest pain, palpitation or leg edema. He does not report any cough or wheezing. He does not report any abdominal pain, constipation, diarrhea or distention. Did not report any frequency urgency or hesitancy. He reports no arthralgias or myalgias. The review of systems otherwise unremarkable.  Medications: I have reviewed the patient's current medications. Current Outpatient Prescriptions  Medication Sig Dispense Refill  . levothyroxine (SYNTHROID, LEVOTHROID) 88 MCG tablet Take 1 tablet (88 mcg total) by mouth daily. 90 tablet 3   No current facility-administered medications for this visit.    Allergies: No Known Allergies  Past Medical History, Surgical history, Social history, and Family History were reviewed and updated.   Physical Exam: Blood pressure 119/50,  pulse 52, temperature 98.4 F (36.9 C), temperature source Oral, resp. rate 20, height  (1.753 m), weight 177 lb 3.2 oz (80.377 kg), SpO2 100 %. ECOG: 0 General appearance: alert awake healthy appearing man without distress. Head: Normocephalic, without obvious abnormality Neck: no adenopathy Lymph nodes: Cervical, supraclavicular, and axillary nodes normal. Heart:regular rate and rhythm, S1, S2 normal, no murmur, click, rub or gallop Lung:chest clear, no wheezing, rales, normal symmetric air entry Abdomin: soft, non-tender, without masses or organomegaly EXT:no erythema, induration, or nodules   Lab Results: Lab Results  Component Value Date   WBC 3.7* 08/17/2015   HGB 15.5 08/17/2015   HCT 45.4 08/17/2015   MCV 82.4 08/17/2015   PLT 62* 08/17/2015     Chemistry      Component Value Date/Time   NA 139 12/15/2014 0903   NA 138 08/23/2014 1018   K 4.3 12/15/2014 0903   K 4.1 08/23/2014 1018   CL 103 08/23/2014 1018   CL 106 12/15/2012 0948   CO2 30* 12/15/2014 0903   CO2 28 08/23/2014 1018   BUN 12.4 12/15/2014 0903   BUN 13 08/23/2014 1018   CREATININE 1.2 12/15/2014 0903   CREATININE 1.2 08/23/2014 1018      Component Value Date/Time   CALCIUM 9.6 12/15/2014 0903   CALCIUM 9.2 08/23/2014 1018   ALKPHOS 73 12/15/2014 0903   ALKPHOS 59 08/23/2014 1018   AST 28 12/15/2014 0903   AST 27 08/23/2014 1018   ALT 32 12/15/2014 0903   ALT 23 08/23/2014 1018   BILITOT 0.47 12/15/2014 0903   BILITOT 0.6 08/23/2014 1018      Impression and Plan: This is a pleasant, 44 year old gentleman with the following issues:  1. Thrombocytopenia. The differential diagnosis including autoimmune versus medication, versus a platelet function abnormality such as Bernard-Soulier or May-Hegglin anomaly.  His platelet count is close to 50,000 which should be more than adequate for to prevent any spontaneous bleeding. I see no indications for treatment including transfusions or steroid  trials. 2. Recent family history diagnosis of gastric cancer: He is asymptomatic at this point. CT scan in 06/22/2013 did not show any abnormalities. 3. Followup: 8 months or sooner if needed to.   [facility], MD 9/8/201611:37 AM

## 2015-08-17 NOTE — Telephone Encounter (Signed)
per pof to sh pt appt-gave pt copy pf a

## 2015-09-04 ENCOUNTER — Other Ambulatory Visit: Payer: Self-pay | Admitting: Family Medicine

## 2015-09-14 ENCOUNTER — Ambulatory Visit (INDEPENDENT_AMBULATORY_CARE_PROVIDER_SITE_OTHER): Payer: BLUE CROSS/BLUE SHIELD | Admitting: Family Medicine

## 2015-09-14 ENCOUNTER — Encounter: Payer: Self-pay | Admitting: Family Medicine

## 2015-09-14 VITALS — BP 120/82 | HR 64 | Temp 98.2°F | Ht 69.0 in | Wt 176.3 lb

## 2015-09-14 DIAGNOSIS — R21 Rash and other nonspecific skin eruption: Secondary | ICD-10-CM | POA: Diagnosis not present

## 2015-09-14 MED ORDER — TRIAMCINOLONE ACETONIDE 0.1 % EX CREA: 1.0000 "application " | TOPICAL_CREAM | Freq: Two times a day (BID) | CUTANEOUS | Status: DC

## 2015-09-14 NOTE — Progress Notes (Signed)
Pre visit review using our clinic review tool, if applicable. No additional management support is needed unless otherwise documented below in the visit note. 

## 2015-09-14 NOTE — Progress Notes (Signed)
   Subjective:    Patient ID: Joseph Lindsey, male    DOB: February 20, 1971, 44 y.o.   MRN: [id number]  HPI Patient seen for acute visit. 2 month history of dry scaly skin rash right side of neck. He is not aware of any other areas of rash. This is not pruritic. Nonpainful. He's tried over-the-counter lotions without improvement. Denies any known history of eczema. No known contact allergy. No exacerbating factors.  Past Medical History  Diagnosis Date  . Fissure, anal     2003  . Migraines   . Primary thrombocytopenia, unspecified   . Thyroid disease     hypothyroid   Past Surgical History  Procedure Laterality Date  . Breath tek h pylori N/A 11/11/2013    Procedure: BREATH TEK H PYLORI;  Surgeon: Beverley Fiedler, MD;  Location: [facility];  Service: Gastroenterology;  Laterality: N/A;    reports that he has never smoked. He has never used smokeless tobacco. He reports that he drinks alcohol. He reports that he does not use illicit drugs. family history includes Asthma in his brother, father, and sister; Cancer (age of onset: 56) in his brother; Diabetes in his father and sister; Hypertension in his father; Sickle cell trait in an other family member; Stomach cancer in his brother. There is no history of Colon cancer, Esophageal cancer, or Rectal cancer. No Known Allergies    Review of Systems  Constitutional: Negative for fever and chills.  Skin: Positive for rash.       Objective:   Physical Exam  Constitutional: He appears well-developed and well-nourished.  Neck: Neck supple.  Lymphadenopathy:    He has no cervical adenopathy.  Skin: Rash noted.  1 x 1.5 cm area of rash which is dry and scaly and relatively homogenous. No elevated border. No pustules.          Assessment & Plan:  Eczematous skin rash. Triamcinolone 0.1% cream twice daily. Touch base if not improving next couple weeks.

## 2015-10-09 ENCOUNTER — Telehealth: Payer: Self-pay

## 2015-10-09 ENCOUNTER — Other Ambulatory Visit (INDEPENDENT_AMBULATORY_CARE_PROVIDER_SITE_OTHER): Payer: BLUE CROSS/BLUE SHIELD

## 2015-10-09 DIAGNOSIS — Z0000 Encounter for general adult medical examination without abnormal findings: Secondary | ICD-10-CM

## 2015-10-09 LAB — CBC WITH DIFFERENTIAL/PLATELET
BASOS PCT: 0.7 % (ref 0.0–3.0)
Basophils Absolute: 0 10*3/uL (ref 0.0–0.1)
EOS ABS: 0.1 10*3/uL (ref 0.0–0.7)
EOS PCT: 1.9 % (ref 0.0–5.0)
HEMATOCRIT: 50.5 % (ref 39.0–52.0)
HEMOGLOBIN: 16.4 g/dL (ref 13.0–17.0)
LYMPHS PCT: 34.4 % (ref 12.0–46.0)
Lymphs Abs: 1.6 10*3/uL (ref 0.7–4.0)
MCHC: 32.5 g/dL (ref 30.0–36.0)
MCV: 83.6 fl (ref 78.0–100.0)
MONOS PCT: 10.4 % (ref 3.0–12.0)
Monocytes Absolute: 0.5 10*3/uL (ref 0.1–1.0)
Neutro Abs: 2.4 10*3/uL (ref 1.4–7.7)
Neutrophils Relative %: 52.6 % (ref 43.0–77.0)
Platelets: 35 10*3/uL — CL (ref 150.0–400.0)
RBC: 6.04 Mil/uL — ABNORMAL HIGH (ref 4.22–5.81)
RDW: 14.3 % (ref 11.5–15.5)
WBC: 4.6 10*3/uL (ref 4.0–10.5)

## 2015-10-09 LAB — HEPATIC FUNCTION PANEL
ALBUMIN: 3.9 g/dL (ref 3.5–5.2)
ALT: 27 U/L (ref 0–53)
AST: 23 U/L (ref 0–37)
Alkaline Phosphatase: 69 U/L (ref 39–117)
BILIRUBIN DIRECT: 0.1 mg/dL (ref 0.0–0.3)
TOTAL PROTEIN: 6.8 g/dL (ref 6.0–8.3)
Total Bilirubin: 0.4 mg/dL (ref 0.2–1.2)

## 2015-10-09 LAB — LIPID PANEL
CHOL/HDL RATIO: 4
Cholesterol: 220 mg/dL — ABNORMAL HIGH (ref 0–200)
HDL: 51.8 mg/dL (ref >39.00)
LDL Cholesterol: 152 mg/dL — ABNORMAL HIGH (ref 0–99)
NONHDL: 168.63
TRIGLYCERIDES: 84 mg/dL (ref 0.0–149.0)
VLDL: 16.8 mg/dL (ref 0.0–40.0)

## 2015-10-09 LAB — BASIC METABOLIC PANEL
BUN: 7 mg/dL (ref 6–23)
CHLORIDE: 103 meq/L (ref 96–112)
CO2: 28 meq/L (ref 19–32)
CREATININE: 1.11 mg/dL (ref 0.40–1.50)
Calcium: 9.7 mg/dL (ref 8.4–10.5)
GFR: 92.76 mL/min (ref >60.00)
Glucose, Bld: 85 mg/dL (ref 70–99)
POTASSIUM: 4.4 meq/L (ref 3.5–5.1)
SODIUM: 140 meq/L (ref 135–145)

## 2015-10-09 LAB — TSH: TSH: 2.02 u[IU]/mL (ref 0.35–4.50)

## 2015-10-09 NOTE — Telephone Encounter (Addendum)
Attempted to call pt at home and at work. Was able to leave message about making him an appt as per prior note. Was not able to contact pt in person to ask if he is having any bleeding or other problems.

## 2015-10-09 NOTE — Telephone Encounter (Signed)
CRITICAL VALUE: PLATELETS 35  RECEIVED BY: Aqeel Norgaard   TIME RECEIVED: 4:00pm 10/09/15  MD TOLD: Dr Selena BattenKim   RESPONSE: advised to contact the oncologist

## 2015-10-09 NOTE — Telephone Encounter (Signed)
Reached out to oncologist to make aware of platelets level. Was advised they will reach out to pt with appropriate interventions.

## 2015-10-09 NOTE — Telephone Encounter (Addendum)
Nurse from Dr Caryl NeverBurchette office calling to make an appt for the pt d/t his platelets of 35 drawn on 10/31. [facility] to contact oncologist.  Call transferred to scheduler. lvm on pt home line that we will make an appt for him. Dr Clelia CroftShadad is out of the office today and may countermand this when he evaluates the labs.

## 2015-10-10 ENCOUNTER — Telehealth: Payer: Self-pay | Admitting: Oncology

## 2015-10-10 NOTE — Telephone Encounter (Signed)
s.w. pt and advised on 11.2 appt.Marland Kitchen.Marland Kitchen.Marland Kitchen.Marland Kitchen.pt ok and aware

## 2015-10-11 ENCOUNTER — Other Ambulatory Visit: Payer: Self-pay | Admitting: Oncology

## 2015-10-11 ENCOUNTER — Other Ambulatory Visit (HOSPITAL_BASED_OUTPATIENT_CLINIC_OR_DEPARTMENT_OTHER): Payer: BLUE CROSS/BLUE SHIELD

## 2015-10-11 ENCOUNTER — Ambulatory Visit (HOSPITAL_BASED_OUTPATIENT_CLINIC_OR_DEPARTMENT_OTHER): Payer: BLUE CROSS/BLUE SHIELD | Admitting: Oncology

## 2015-10-11 VITALS — BP 118/71 | HR 54 | Temp 98.2°F | Resp 18 | Ht 69.0 in | Wt 176.0 lb

## 2015-10-11 DIAGNOSIS — D696 Thrombocytopenia, unspecified: Secondary | ICD-10-CM

## 2015-10-11 LAB — CBC WITH DIFFERENTIAL/PLATELET
BASO%: 0.4 % (ref 0.0–2.0)
BASOS ABS: 0 10*3/uL (ref 0.0–0.1)
EOS ABS: 0.1 10*3/uL (ref 0.0–0.5)
EOS%: 1.4 % (ref 0.0–7.0)
HCT: 46.1 % (ref 38.4–49.9)
HEMOGLOBIN: 15.8 g/dL (ref 13.0–17.1)
LYMPH#: 2.3 10*3/uL (ref 0.9–3.3)
LYMPH%: 46.1 % (ref 14.0–49.0)
MCH: 28.1 pg (ref 27.2–33.4)
MCHC: 34.3 g/dL (ref 32.0–36.0)
MCV: 82 fL (ref 79.3–98.0)
MONO#: 0.5 10*3/uL (ref 0.1–0.9)
MONO%: 9.1 % (ref 0.0–14.0)
NEUT#: 2.1 10*3/uL (ref 1.5–6.5)
NEUT%: 43 % (ref 39.0–75.0)
NRBC: 0 % (ref 0–0)
PLATELETS: 59 10*3/uL — AB (ref 140–400)
RBC: 5.62 10*6/uL (ref 4.20–5.82)
RDW: 13.7 % (ref 11.0–14.6)
WBC: 4.9 10*3/uL (ref 4.0–10.3)

## 2015-10-11 NOTE — Progress Notes (Signed)
Hematology and Oncology Follow Up Visit  Joseph Lindsey [id number] 1971-06-13 44 y.o. 10/11/2015 3:32 PM  CC: [name], M.D.    Principle Diagnosis: This is a 44 year old gentleman with thrombocytopenia diagnosed in 2011. Etiologies  Including autoimmune thrombocytopenia such as idiopathic thrombocytopenic purpura vs. a platelet function abnormality such as Bernard-Soulier or May-Hegglin anomaly. His workup did not reveal any malignancy or splenomegaly.   Current therapy: Observation and  surveillance.  Interim History:   Joseph Lindsey was seen today based on request by his primary care provider. He had a physical and his platelet count was 35 on 10/09/2015. Since his last visit, he continues to do well and completely asymptomatic. He did not report any active bleeding including hematochezia,melena, epistaxis or easy bruisability. He has not reported any recent illnesses or infections. He did not report any change in his medications.   He does not report any constitutional symptoms such as fevers or chills or sweats. He does not report any weight loss or appetite changes. He did not report any chest pain or difficulty breathing.  He had not reported any major changes in his performance status or activity level. He does not report any chest pain, palpitation or leg edema. He does not report any cough or wheezing. He does not report any abdominal pain, constipation, diarrhea or distention. Did not report any frequency urgency or hesitancy. He reports no arthralgias or myalgias. The review of systems otherwise unremarkable.  Medications: I have reviewed the patient's current medications. Current Outpatient Prescriptions  Medication Sig Dispense Refill  . levothyroxine (SYNTHROID, LEVOTHROID) 88 MCG tablet TAKE 1 TABLET DAILY 90 tablet 0  . triamcinolone cream (KENALOG) 0.1 % Apply 1 application topically 2 (two) times daily. 30 g 1   No current facility-administered medications for this  visit.    Allergies: No Known Allergies  Past Medical History, Surgical history, Social history, and Family History were reviewed and updated.   Physical Exam: Blood pressure 118/71, pulse 54, temperature 98.2 F (36.8 C), temperature source Oral, resp. rate 18, height  (1.753 m), weight 176 lb (79.833 kg), SpO2 100 %. ECOG: 0 General appearance: alert awake not in any distress. Head: Normocephalic, without obvious abnormality oral mucosa is moist and pink. Neck: no adenopathy Lymph nodes: Cervical, supraclavicular, and axillary nodes normal. Heart:regular rate and rhythm, S1, S2 normal, no murmur, click, rub or gallop Lung:chest clear, no wheezing, rales, normal symmetric air entry Abdomin: soft, non-tender, without masses or organomegaly EXT:no erythema, induration, or nodules   Lab Results: Lab Results  Component Value Date   WBC 4.9 10/11/2015   HGB 15.8 10/11/2015   HCT 46.1 10/11/2015   MCV 82.0 10/11/2015   PLT 59* 10/11/2015     Chemistry      Component Value Date/Time   NA 140 10/09/2015 0940   NA 140 08/17/2015 1114   K 4.4 10/09/2015 0940   K 4.1 08/17/2015 1114   CL 103 10/09/2015 0940   CL 106 12/15/2012 0948   CO2 28 10/09/2015 0940   CO2 26 08/17/2015 1114   BUN 7 10/09/2015 0940   BUN 10.3 08/17/2015 1114   CREATININE 1.11 10/09/2015 0940   CREATININE 1.2 08/17/2015 1114      Component Value Date/Time   CALCIUM 9.7 10/09/2015 0940   CALCIUM 9.4 08/17/2015 1114   ALKPHOS 69 10/09/2015 0940   ALKPHOS 67 08/17/2015 1114   AST 23 10/09/2015 0940   AST 24 08/17/2015 1114   ALT 27 10/09/2015 0940  ALT 30 08/17/2015 1114   BILITOT 0.4 10/09/2015 0940   BILITOT 0.60 08/17/2015 1114      Impression and Plan: This is a pleasant, 44 year old gentleman with the following issues:  1. Thrombocytopenia: His platelet count was repeated today and based on her labs his platelet count was around 59. He has no signs or symptoms of bleeding and does not  require intervention. The etiology likely related to immune thrombocytopenia and prednisone therapy would be indicated if his platelet counts drop below 50 or he has active bleeding. Since his platelet count is clearly above 50 at this time we will hold off on prednisone therapy. He is to let me know if he is scheduled to have any surgery or if bleeding occurs. 2. Followup: 6 months or sooner if needed to.   Eli HoseSHADAD,Dmario Russom, MD 11/2/20163:32 PM

## 2015-10-16 ENCOUNTER — Encounter: Payer: Self-pay | Admitting: Family Medicine

## 2015-10-16 ENCOUNTER — Ambulatory Visit (INDEPENDENT_AMBULATORY_CARE_PROVIDER_SITE_OTHER): Payer: BLUE CROSS/BLUE SHIELD | Admitting: Family Medicine

## 2015-10-16 VITALS — BP 110/70 | HR 67 | Temp 98.4°F | Resp 16 | Ht 69.0 in | Wt 174.9 lb

## 2015-10-16 DIAGNOSIS — Z0000 Encounter for general adult medical examination without abnormal findings: Secondary | ICD-10-CM

## 2015-10-16 NOTE — Progress Notes (Signed)
   Subjective:    Patient ID: Joseph Lindsey, male    DOB: [birth year], 44 y.o.   MRN: [id number]  HPI Patient seen for complete physical. He has history of chronic thrombocytopenia followed by hematology. He has hypothyroidism treated with levothyroxine. Compliant with therapy. Nonsmoker. No consistent exercise. He currently works 40 hours per week and is attending school full time- Pharmacist, communitystudying computer science. Frequently only gets about 3 hours sleep per night.  Past Medical History  Diagnosis Date  . Fissure, anal     2003  . Migraines   . Primary thrombocytopenia, unspecified   . Thyroid disease     hypothyroid   Past Surgical History  Procedure Laterality Date  . Breath tek h pylori N/A 11/11/2013    Procedure: BREATH TEK H PYLORI;  Surgeon: Beverley FiedlerJay M Pyrtle, MD;  Location: [facility];  Service: Gastroenterology;  Laterality: N/A;    reports that he has never smoked. He has never used smokeless tobacco. He reports that he drinks alcohol. He reports that he does not use illicit drugs. family history includes Asthma in his brother, father, and sister; Cancer (age of onset: 5744) in his brother; Diabetes in his father and sister; Hypertension in his father; Sickle cell trait in an other family member; Stomach cancer in his brother. There is no history of Colon cancer, Esophageal cancer, or Rectal cancer. No Known Allergies    Review of Systems  Constitutional: Negative for fever, activity change, appetite change and fatigue.  HENT: Negative for congestion, ear pain and trouble swallowing.   Eyes: Negative for pain and visual disturbance.  Respiratory: Negative for cough, shortness of breath and wheezing.   Cardiovascular: Negative for chest pain and palpitations.  Gastrointestinal: Negative for nausea, vomiting, abdominal pain, diarrhea, constipation, blood in stool, abdominal distention and rectal pain.  Genitourinary: Negative for dysuria, hematuria and testicular pain.    Musculoskeletal: Negative for joint swelling and arthralgias.  Skin: Negative for rash.  Neurological: Negative for dizziness, syncope and headaches.  Hematological: Negative for adenopathy.  Psychiatric/Behavioral: Negative for confusion and dysphoric mood.       Objective:   Physical Exam  Constitutional: He is oriented to person, place, and time. He appears well-developed and well-nourished. No distress.  HENT:  Head: Normocephalic and atraumatic.  Right Ear: External ear normal.  Left Ear: External ear normal.  Mouth/Throat: Oropharynx is clear and moist.  Eyes: Conjunctivae and EOM are normal. Pupils are equal, round, and reactive to light.  Neck: Normal range of motion. Neck supple. No thyromegaly present.  Cardiovascular: Normal rate, regular rhythm and normal heart sounds.   No murmur heard. Pulmonary/Chest: No respiratory distress. He has no wheezes. He has no rales.  Abdominal: Soft. Bowel sounds are normal. He exhibits no distension and no mass. There is no tenderness. There is no rebound and no guarding.  Musculoskeletal: He exhibits no edema.  Lymphadenopathy:    He has no cervical adenopathy.  Neurological: He is alert and oriented to person, place, and time. He displays normal reflexes. No cranial nerve deficit.  Skin: No rash noted.  Psychiatric: He has a normal mood and affect.          Assessment & Plan:  Complete physical. Flu vaccine declined. Tetanus up-to-date. Labs reviewed. Continue current dose levothyroxin.

## 2015-10-16 NOTE — Progress Notes (Signed)
Pre visit review using our clinic review tool, if applicable. No additional management support is needed unless otherwise documented below in the visit note. 

## 2015-11-29 ENCOUNTER — Ambulatory Visit (INDEPENDENT_AMBULATORY_CARE_PROVIDER_SITE_OTHER): Payer: BLUE CROSS/BLUE SHIELD | Admitting: Family Medicine

## 2015-11-29 ENCOUNTER — Encounter: Payer: Self-pay | Admitting: Family Medicine

## 2015-11-29 VITALS — BP 90/70 | HR 61 | Temp 97.4°F | Resp 14 | Ht 69.0 in | Wt 175.2 lb

## 2015-11-29 DIAGNOSIS — R066 Hiccough: Secondary | ICD-10-CM | POA: Diagnosis not present

## 2015-11-29 NOTE — Progress Notes (Signed)
   Subjective:    Patient ID: Joseph Lindsey, male    DOB: Jan 30, 1971, 44 y.o.   MRN: [id number]  HPI Patient seen for evaluation of hiccups. He states he had a tendency to get hiccups in the past and these are usually associated with upper respiratory infection. Last week on Tuesday developed headache, body aches, fatigue, nasal congestion, and cough. He tried several medications including Allegra without much relief. Subsequently took NyQuil and Sudafed with some relief. By Friday he developed hiccups and these lasted until yesterday. He's had none whatsoever today. He tried Valsalva maneuver several times without improvement.  Respiratory symptoms are improving. He's ad similar hiccups frequently in the past but never intractable. They usually last 2-3 days. Minimal cough at this time. Previous CT abdomen and pelvis unremarkable  Past Medical History  Diagnosis Date  . Fissure, anal     2003  . Migraines   . Primary thrombocytopenia, unspecified   . Thyroid disease     hypothyroid   Past Surgical History  Procedure Laterality Date  . Breath tek h pylori N/A 11/11/2013    Procedure: BREATH TEK H PYLORI;  Surgeon: Beverley FiedlerJay M Pyrtle, MD;  Location: [facility];  Service: Gastroenterology;  Laterality: N/A;    reports that he has never smoked. He has never used smokeless tobacco. He reports that he drinks alcohol. He reports that he does not use illicit drugs. family history includes Asthma in his brother, father, and sister; Cancer (age of onset: 3544) in his brother; Diabetes in his father and sister; Hypertension in his father; Stomach cancer in his brother. There is no history of Colon cancer, Esophageal cancer, or Rectal cancer. No Known Allergies     Review of Systems  Constitutional: Positive for fatigue. Negative for fever and chills.  HENT: Positive for congestion. Negative for sore throat.   Respiratory: Positive for cough.   Cardiovascular: Negative for chest pain.    Gastrointestinal: Negative for abdominal pain.  Neurological: Positive for headaches.       Objective:   Physical Exam  Constitutional: He appears well-developed and well-nourished. No distress.  HENT:  Right Ear: External ear normal.  Left Ear: External ear normal.  Mouth/Throat: Oropharynx is clear and moist.  Neck: Neck supple. No thyromegaly present.  Cardiovascular: Normal rate and regular rhythm.   Pulmonary/Chest: Effort normal and breath sounds normal. No respiratory distress. He has no wheezes. He has no rales.  Abdominal: Soft. There is no tenderness.  Lymphadenopathy:    He has no cervical adenopathy.          Assessment & Plan:  #1 recent viral URI with cough. Improving. Nonfocal exam. Reassurance #2 hiccups. Possibly triggered by recent acute bronchitis with cough. Resolved at this time. Reassurance.

## 2015-11-29 NOTE — Progress Notes (Signed)
Pre visit review using our clinic review tool, if applicable. No additional management support is needed unless otherwise documented below in the visit note. 

## 2015-11-29 NOTE — Patient Instructions (Signed)
Hiccups °A hiccup is the result of a sudden shortening of the muscle below your lungs (diaphragm). This movement of your diaphragm causes a sudden inhalation followed by the closing of your vocal cords, which causes the hiccup sound. Most people get the hiccups. Typically, hiccups last only a short amount of time.  °There are three types of hiccups:  °· Benign. These hiccups last less than 48 hours.   °· Persistent. These hiccups last more than 48 hours, but less than 1 month.   °· Intractable. These hiccups last more than 1 month.   °A hiccup is a reflex. You cannot control reflexes.  °HOME CARE INSTRUCTIONS  °Watch your hiccups for any changes. The following actions may help to lessen any discomfort that you are feeling: °· Eat small meals.   °· Limit alcohol intake to no more than 1 drink per day for nonpregnant women and 2 drinks per day for men. One drink equals 12 oz of beer, 5 oz of wine, or 1½ oz of hard liquor. °· Limit drinking carbonated or fizzy drinks, such as soda. °· Eat and chew your food slowly.   °· Avoid eating or drinking hot or spicy foods and drinks. °· Take medicines only as directed by your health care provider.   °SEEK MEDICAL CARE IF:  °· Your hiccups last for more than 48 hours.   °· Your hiccups do not improve with treatment. °· You cannot sleep or eat due to the hiccups.   °· You have unexpected weight loss due to the hiccups.   °· You have a fever.   °· You have trouble breathing or swallowing.   °· You develop severe pain in your abdomen. °· You develop numbness, tingling, or weakness. °  °This information is not intended to replace advice given to you by your health care provider. Make sure you discuss any questions you have with your health care provider. °  °Document Released: 02/03/2002 Document Revised: 04/11/2015 Document Reviewed: 11/21/2014 °Elsevier Interactive Patient Education ©2016 [employer] ° °

## 2015-11-30 ENCOUNTER — Other Ambulatory Visit: Payer: Self-pay | Admitting: Family Medicine

## 2016-01-23 ENCOUNTER — Emergency Department (HOSPITAL_COMMUNITY)
Admission: EM | Admit: 2016-01-23 | Discharge: 2016-01-23 | Disposition: A | Discharge status: Home / Self Care | Payer: BLUE CROSS/BLUE SHIELD | Attending: Emergency Medicine | Admitting: Emergency Medicine

## 2016-01-23 ENCOUNTER — Encounter (HOSPITAL_COMMUNITY): Payer: Self-pay | Admitting: Emergency Medicine

## 2016-01-23 ENCOUNTER — Emergency Department (HOSPITAL_COMMUNITY): Payer: BLUE CROSS/BLUE SHIELD

## 2016-01-23 DIAGNOSIS — S0083XA Contusion of other part of head, initial encounter: Secondary | ICD-10-CM | POA: Diagnosis not present

## 2016-01-23 DIAGNOSIS — Z7952 Long term (current) use of systemic steroids: Secondary | ICD-10-CM | POA: Insufficient documentation

## 2016-01-23 DIAGNOSIS — Z8679 Personal history of other diseases of the circulatory system: Secondary | ICD-10-CM | POA: Insufficient documentation

## 2016-01-23 DIAGNOSIS — Z79899 Other long term (current) drug therapy: Secondary | ICD-10-CM | POA: Insufficient documentation

## 2016-01-23 DIAGNOSIS — D696 Thrombocytopenia, unspecified: Secondary | ICD-10-CM | POA: Diagnosis not present

## 2016-01-23 DIAGNOSIS — Y9389 Activity, other specified: Secondary | ICD-10-CM | POA: Insufficient documentation

## 2016-01-23 DIAGNOSIS — E039 Hypothyroidism, unspecified: Secondary | ICD-10-CM | POA: Insufficient documentation

## 2016-01-23 DIAGNOSIS — Y92410 Unspecified street and highway as the place of occurrence of the external cause: Secondary | ICD-10-CM | POA: Diagnosis not present

## 2016-01-23 DIAGNOSIS — S0990XA Unspecified injury of head, initial encounter: Secondary | ICD-10-CM | POA: Diagnosis present

## 2016-01-23 DIAGNOSIS — Y998 Other external cause status: Secondary | ICD-10-CM | POA: Diagnosis not present

## 2016-01-23 DIAGNOSIS — Z8719 Personal history of other diseases of the digestive system: Secondary | ICD-10-CM | POA: Insufficient documentation

## 2016-01-23 LAB — CBC WITH DIFFERENTIAL/PLATELET
BASOS ABS: 0 10*3/uL (ref 0.0–0.1)
Basophils Relative: 0 %
EOS PCT: 1 %
Eosinophils Absolute: 0 10*3/uL (ref 0.0–0.7)
HEMATOCRIT: 46.9 % (ref 39.0–52.0)
Hemoglobin: 15.4 g/dL (ref 13.0–17.0)
LYMPHS PCT: 41 %
Lymphs Abs: 2.1 10*3/uL (ref 0.7–4.0)
MCH: 27.6 pg (ref 26.0–34.0)
MCHC: 32.8 g/dL (ref 30.0–36.0)
MCV: 84.1 fL (ref 78.0–100.0)
MONO ABS: 0.4 10*3/uL (ref 0.1–1.0)
MONOS PCT: 8 %
NEUTROS ABS: 2.6 10*3/uL (ref 1.7–7.7)
Neutrophils Relative %: 51 %
PLATELETS: 66 10*3/uL — AB (ref 150–400)
RBC: 5.58 MIL/uL (ref 4.22–5.81)
RDW: 14 % (ref 11.5–15.5)
WBC: 5.1 10*3/uL (ref 4.0–10.5)

## 2016-01-23 LAB — I-STAT CHEM 8, ED
BUN: 13 mg/dL (ref 6–20)
CREATININE: 0.9 mg/dL (ref 0.61–1.24)
Calcium, Ion: 1.19 mmol/L (ref 1.12–1.23)
Chloride: 105 mmol/L (ref 101–111)
GLUCOSE: 79 mg/dL (ref 65–99)
HCT: 50 % (ref 39.0–52.0)
HEMOGLOBIN: 17 g/dL (ref 13.0–17.0)
POTASSIUM: 4.2 mmol/L (ref 3.5–5.1)
Sodium: 141 mmol/L (ref 135–145)
TCO2: 26 mmol/L (ref 0–100)

## 2016-01-23 MED ORDER — HYDROCODONE-ACETAMINOPHEN 5-325 MG PO TABS
2.0000 | ORAL_TABLET | Freq: Once | ORAL | Status: AC
  Administered 2016-01-23: 2 via ORAL
  Filled 2016-01-23: qty 2
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
## 2016-01-23 NOTE — ED Notes (Signed)
Bed: WA27 Expected date:  Expected time:  Means of arrival:  Comments: 
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
## 2016-01-25 ENCOUNTER — Encounter: Payer: Self-pay | Admitting: Family Medicine

## 2016-01-25 ENCOUNTER — Ambulatory Visit (INDEPENDENT_AMBULATORY_CARE_PROVIDER_SITE_OTHER): Payer: BLUE CROSS/BLUE SHIELD | Admitting: Family Medicine

## 2016-01-25 VITALS — BP 100/80 | HR 64 | Temp 97.6°F | Ht 69.0 in | Wt 174.0 lb

## 2016-01-25 DIAGNOSIS — S0093XD Contusion of unspecified part of head, subsequent encounter: Secondary | ICD-10-CM | POA: Diagnosis not present

## 2016-01-25 DIAGNOSIS — M546 Pain in thoracic spine: Secondary | ICD-10-CM

## 2016-01-25 DIAGNOSIS — D696 Thrombocytopenia, unspecified: Secondary | ICD-10-CM

## 2016-01-25 DIAGNOSIS — M549 Dorsalgia, unspecified: Secondary | ICD-10-CM
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
## 2016-01-25 NOTE — Progress Notes (Signed)
Pre visit review using our clinic review tool, if applicable. No additional management support is needed unless otherwise documented below in the visit note. 

## 2016-01-25 NOTE — Patient Instructions (Signed)
Muscle Pain, Adult  Muscle pain (myalgia) may be caused by many things, including:  · Overuse or muscle strain, especially if you are not in shape. This is the most common cause of muscle pain.  · Injury.  · Bruises.  · Viruses, such as the flu.  · Infectious diseases.  · Fibromyalgia, which is a chronic condition that causes muscle tenderness, fatigue, and headache.  · Autoimmune diseases, including lupus.  · Certain drugs, including ACE inhibitors and statins.  Muscle pain may be mild or severe. In most cases, the pain lasts only a short time and goes away without treatment. To diagnose the cause of your muscle pain, your health care provider will take your medical history. This means he or she will ask you when your muscle pain began and what has been happening. If you have not had muscle pain for very long, your health care provider may want to wait before doing much testing. If your muscle pain has lasted a long time, your health care provider may want to run tests right away. If your health care provider thinks your muscle pain may be caused by illness, you may need to have additional tests to rule out certain conditions.   Treatment for muscle pain depends on the cause. Home care is often enough to relieve muscle pain. Your health care provider may also prescribe anti-inflammatory medicine.  HOME CARE INSTRUCTIONS  Watch your condition for any changes. The following actions may help to lessen any discomfort you are feeling:  · Only take over-the-counter or prescription medicines as directed by your health care provider.  · Apply ice to the sore muscle:    Put ice in a plastic bag.    Place a towel between your skin and the bag.    Leave the ice on for 15-20 minutes, 3-4 times a day.  · You may alternate applying hot and cold packs to the muscle as directed by your health care provider.  · If overuse is causing your muscle pain, slow down your activities until the pain goes away.    Remember that it is normal  to feel some muscle pain after starting a workout program. Muscles that have not been used often will be sore at first.    Do regular, gentle exercises if you are not usually active.    Warm up before exercising to lower your risk of muscle pain.  · Do not continue working out if the pain is very bad. Bad pain could mean you have injured a muscle.  SEEK MEDICAL CARE IF:  · Your muscle pain gets worse, and medicines do not help.  · You have muscle pain that lasts longer than 3 days.  · You have a rash or fever along with muscle pain.  · You have muscle pain after a tick bite.  · You have muscle pain while working out, even though you are in good physical condition.  · You have redness, soreness, or swelling along with muscle pain.  · You have muscle pain after starting a new medicine or changing the dose of a medicine.  SEEK IMMEDIATE MEDICAL CARE IF:  · You have trouble breathing.  · You have trouble swallowing.  · You have muscle pain along with a stiff neck, fever, and vomiting.  · You have severe muscle weakness or cannot move part of your body.  MAKE SURE YOU:   · Understand these instructions.  · Will watch your condition.  · Will get   help right away if you are not doing well or get worse.     This information is not intended to replace advice given to you by your health care provider. Make sure you discuss any questions you have with your health care provider.     Document Released: 10/17/2006 Document Revised: 12/16/2014 Document Reviewed: 09/21/2013  Elsevier Interactive Patient Education ©2016 [employer]

## 2016-02-02 ENCOUNTER — Encounter: Payer: Self-pay | Admitting: Family Medicine

## 2016-04-16 ENCOUNTER — Other Ambulatory Visit (HOSPITAL_BASED_OUTPATIENT_CLINIC_OR_DEPARTMENT_OTHER): Payer: BLUE CROSS/BLUE SHIELD

## 2016-04-16 ENCOUNTER — Ambulatory Visit (HOSPITAL_BASED_OUTPATIENT_CLINIC_OR_DEPARTMENT_OTHER): Payer: BLUE CROSS/BLUE SHIELD | Admitting: Oncology

## 2016-04-16 ENCOUNTER — Telehealth: Payer: Self-pay | Admitting: Oncology

## 2016-04-16 VITALS — BP 117/63 | HR 50 | Temp 98.3°F | Resp 16 | Ht 69.0 in | Wt 171.5 lb

## 2016-04-16 DIAGNOSIS — D696 Thrombocytopenia, unspecified: Secondary | ICD-10-CM

## 2016-04-16 LAB — CBC WITH DIFFERENTIAL/PLATELET
BASO%: 0.3 % (ref 0.0–2.0)
Basophils Absolute: 0 10*3/uL (ref 0.0–0.1)
EOS%: 1.3 % (ref 0.0–7.0)
Eosinophils Absolute: 0.1 10*3/uL (ref 0.0–0.5)
HEMATOCRIT: 47.4 % (ref 38.4–49.9)
HEMOGLOBIN: 16.1 g/dL (ref 13.0–17.1)
LYMPH#: 1.7 10*3/uL (ref 0.9–3.3)
LYMPH%: 44.9 % (ref 14.0–49.0)
MCH: 28.2 pg (ref 27.2–33.4)
MCHC: 34 g/dL (ref 32.0–36.0)
MCV: 83 fL (ref 79.3–98.0)
MONO#: 0.3 10*3/uL (ref 0.1–0.9)
MONO%: 8.9 % (ref 0.0–14.0)
NEUT%: 44.6 % (ref 39.0–75.0)
NEUTROS ABS: 1.7 10*3/uL (ref 1.5–6.5)
NRBC: 0 % (ref 0–0)
Platelets: 57 10*3/uL — ABNORMAL LOW (ref 140–400)
RBC: 5.71 10*6/uL (ref 4.20–5.82)
RDW: 13.7 % (ref 11.0–14.6)
WBC: 3.8 10*3/uL — AB (ref 4.0–10.3)
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
## 2016-04-16 NOTE — Telephone Encounter (Signed)
Gave aND printed appt sCHed and avs for pt for nov

## 2016-05-30 ENCOUNTER — Other Ambulatory Visit: Payer: Self-pay | Admitting: Family Medicine

## 2016-06-06 ENCOUNTER — Encounter: Payer: Self-pay | Admitting: *Deleted

## 2016-06-06 NOTE — Progress Notes (Signed)
This RN faxed paperwork back to Rosalio LoudJerry R. Nazziola, D.D.S., office for surgical clearance per Dr. Clelia CroftShadad. Fax # is [phone]. Office # is [phone].

## 2016-09-01 ENCOUNTER — Other Ambulatory Visit: Payer: Self-pay | Admitting: Family Medicine

## 2016-10-03 ENCOUNTER — Other Ambulatory Visit (INDEPENDENT_AMBULATORY_CARE_PROVIDER_SITE_OTHER): Payer: BLUE CROSS/BLUE SHIELD

## 2016-10-03 ENCOUNTER — Telehealth: Payer: Self-pay | Admitting: *Deleted

## 2016-10-03 ENCOUNTER — Telehealth: Payer: Self-pay | Admitting: Emergency Medicine

## 2016-10-03 DIAGNOSIS — Z0000 Encounter for general adult medical examination without abnormal findings: Secondary | ICD-10-CM

## 2016-10-03 LAB — CBC WITH DIFFERENTIAL/PLATELET
BASOS ABS: 0 10*3/uL (ref 0.0–0.1)
Basophils Relative: 0.6 % (ref 0.0–3.0)
Eosinophils Absolute: 0.1 10*3/uL (ref 0.0–0.7)
Eosinophils Relative: 1 % (ref 0.0–5.0)
HCT: 47.9 % (ref 39.0–52.0)
Hemoglobin: 16.1 g/dL (ref 13.0–17.0)
LYMPHS ABS: 2.1 10*3/uL (ref 0.7–4.0)
Lymphocytes Relative: 42.1 % (ref 12.0–46.0)
MCHC: 33.7 g/dL (ref 30.0–36.0)
MCV: 82 fl (ref 78.0–100.0)
MONO ABS: 0.5 10*3/uL (ref 0.1–1.0)
Monocytes Relative: 10 % (ref 3.0–12.0)
NEUTROS PCT: 46.3 % (ref 43.0–77.0)
Neutro Abs: 2.4 10*3/uL (ref 1.4–7.7)
Platelets: 33 10*3/uL — CL (ref 150.0–400.0)
RBC: 5.84 Mil/uL — AB (ref 4.22–5.81)
RDW: 14.1 % (ref 11.5–15.5)
WBC: 5.1 10*3/uL (ref 4.0–10.5)

## 2016-10-03 LAB — POC URINALSYSI DIPSTICK (AUTOMATED)
BILIRUBIN UA: NEGATIVE
GLUCOSE UA: NEGATIVE
Ketones, UA: NEGATIVE
Leukocytes, UA: NEGATIVE
NITRITE UA: NEGATIVE
Protein, UA: NEGATIVE
RBC UA: NEGATIVE
Spec Grav, UA: 1.02
Urobilinogen, UA: 0.2
pH, UA: 6

## 2016-10-03 LAB — BASIC METABOLIC PANEL
BUN: 15 mg/dL (ref 6–23)
CALCIUM: 10.1 mg/dL (ref 8.4–10.5)
CO2: 30 mEq/L (ref 19–32)
Chloride: 102 mEq/L (ref 96–112)
Creatinine, Ser: 1.16 mg/dL (ref 0.40–1.50)
GFR: 87.76 mL/min (ref >60.00)
GLUCOSE: 78 mg/dL (ref 70–99)
Potassium: 4.2 mEq/L (ref 3.5–5.1)
Sodium: 141 mEq/L (ref 135–145)

## 2016-10-03 LAB — HEPATIC FUNCTION PANEL
ALK PHOS: 63 U/L (ref 39–117)
ALT: 27 U/L (ref 0–53)
AST: 25 U/L (ref 0–37)
Albumin: 4.6 g/dL (ref 3.5–5.2)
BILIRUBIN TOTAL: 0.6 mg/dL (ref 0.2–1.2)
Bilirubin, Direct: 0.1 mg/dL (ref 0.0–0.3)
Total Protein: 7.5 g/dL (ref 6.0–8.3)

## 2016-10-03 LAB — LIPID PANEL
CHOLESTEROL: 239 mg/dL — AB (ref 0–200)
HDL: 56.8 mg/dL (ref >39.00)
LDL CALC: 167 mg/dL — AB (ref 0–99)
NonHDL: 181.86
Total CHOL/HDL Ratio: 4
Triglycerides: 74 mg/dL (ref 0.0–149.0)
VLDL: 14.8 mg/dL (ref 0.0–40.0)

## 2016-10-03 LAB — TSH: TSH: 4.34 u[IU]/mL (ref 0.35–4.50)

## 2016-10-03 LAB — PSA: PSA: 0.32 ng/mL (ref 0.10–4.00)

## 2016-10-03 NOTE — Telephone Encounter (Signed)
Call received from "[place] with [facility] requesting doctor's nurse.  This nurse received critical lab results drawn today.  Pltc =  33.  Asked results be faxed to ext [phone] and transferred call to ext [phone] as requested.

## 2016-10-03 NOTE — Telephone Encounter (Signed)
CRITICAL VALUE STICKER  CRITICAL VALUE: platelet count 33,000  RECEIVER (on-site recipient of call): Joseph Lindsey                           DATE & TIME NOTIFIED: 10/03/16 3:08PM  MESSENGER (representative from lab): Joseph CampsLynn Knisel  MD NOTIFIED: Joseph Lindsey   TIME OF NOTIFICATION: 3:20PM  RESPONSE: call Joseph Lindsey and report finding. Per note if levels reached below 50,000 treatment recommended.

## 2016-10-03 NOTE — Telephone Encounter (Signed)
Per Dr. Durene CalHunter call and report to oncology. Called and spoke to  Lillia Corporalosalind W. triage nurse [facility]. She states that she is getting his nurses voicemail. Informed her of the results and she states that labs should be faxed to Dr. Clelia CroftShadad at [phone]. Asked to be routed to nurses voicemail. Left voicemail for to return call to office. Also routed labs  internally to Dr. Clelia CroftShadad.

## 2016-10-04 ENCOUNTER — Other Ambulatory Visit: Payer: BLUE CROSS/BLUE SHIELD

## 2016-10-09 ENCOUNTER — Other Ambulatory Visit: Payer: BLUE CROSS/BLUE SHIELD

## 2016-10-16 ENCOUNTER — Encounter: Payer: Self-pay | Admitting: Family Medicine

## 2016-10-16 DIAGNOSIS — Z0289 Encounter for other administrative examinations: Secondary | ICD-10-CM

## 2016-10-22 ENCOUNTER — Other Ambulatory Visit: Payer: BLUE CROSS/BLUE SHIELD

## 2016-10-22 ENCOUNTER — Ambulatory Visit: Payer: BLUE CROSS/BLUE SHIELD | Admitting: Oncology

## 2016-10-23 ENCOUNTER — Telehealth: Payer: Self-pay | Admitting: Oncology

## 2016-10-23 NOTE — Telephone Encounter (Signed)
Patient called to reschedule missed 11/14 appointment. Rescheduled to next available.

## 2016-10-25 ENCOUNTER — Telehealth: Payer: Self-pay | Admitting: Oncology

## 2016-10-25 NOTE — Telephone Encounter (Signed)
lvm to inform pt of r/s 11/22 appt date/time per MD. Joseph GammaGave pt new appt date/time

## 2016-10-29 ENCOUNTER — Encounter: Payer: Self-pay | Admitting: Family Medicine

## 2016-10-29 ENCOUNTER — Ambulatory Visit (INDEPENDENT_AMBULATORY_CARE_PROVIDER_SITE_OTHER): Payer: BLUE CROSS/BLUE SHIELD | Admitting: Family Medicine

## 2016-10-29 VITALS — BP 120/70 | HR 83 | Temp 98.3°F | Ht 69.0 in | Wt 170.6 lb

## 2016-10-29 DIAGNOSIS — Z0000 Encounter for general adult medical examination without abnormal findings: Secondary | ICD-10-CM

## 2016-10-29 NOTE — Progress Notes (Signed)
Pre visit review using our clinic review tool, if applicable. No additional management support is needed unless otherwise documented below in the visit note. 

## 2016-10-29 NOTE — Progress Notes (Signed)
Subjective:     Patient ID: Joseph Lindsey, male   DOB: [birth year], 45 y.o.   MRN: [id number]  HPI Patient seen for physical exam. He has chronic thrombocytopenia as well as hypothyroidism. He is followed regularly by hematology. Takes levothyroxine and has been compliant with therapy. Stays very busy with attending school part-time and working full-time. Still gets about 3-4 hours sleep per day. No consistent exercise. Immunizations are up-to-date with exception of flu vaccine but she declines. He had questions about hepatitis C screening. No specific concerns other than when he was a child he states that there were questionable immunization practices with using the same needles (he thinks) when he was getting immunizations then.  Past Medical History:  Diagnosis Date  . Fissure, anal    2003  . Migraines   . Primary thrombocytopenia, unspecified   . Thyroid disease    hypothyroid   Past Surgical History:  Procedure Laterality Date  . BREATH TEK H PYLORI N/A 11/11/2013   Procedure: BREATH TEK H PYLORI;  Surgeon: Beverley FiedlerJay M Pyrtle, MD;  Location: [facility];  Service: Gastroenterology;  Laterality: N/A;    reports that he has never smoked. He has never used smokeless tobacco. He reports that he drinks alcohol. He reports that he does not use drugs. family history includes Asthma in his brother, father, and sister; Cancer (age of onset: 8644) in his brother; Diabetes in his father and sister; Hypertension in his father; Stomach cancer in his brother. No Known Allergies   Review of Systems  Constitutional: Negative for activity change, appetite change, fatigue and fever.  HENT: Negative for congestion, ear pain and trouble swallowing.   Eyes: Negative for pain and visual disturbance.  Respiratory: Negative for cough, shortness of breath and wheezing.   Cardiovascular: Negative for chest pain and palpitations.  Gastrointestinal: Negative for abdominal distention, abdominal pain, blood in  stool, constipation, diarrhea, nausea, rectal pain and vomiting.  Genitourinary: Negative for dysuria, hematuria and testicular pain.  Musculoskeletal: Negative for arthralgias and joint swelling.  Skin: Negative for rash.  Neurological: Negative for dizziness, syncope and headaches.  Hematological: Negative for adenopathy.  Psychiatric/Behavioral: Negative for confusion and dysphoric mood.       Objective:   Physical Exam  Constitutional: He is oriented to person, place, and time. He appears well-developed and well-nourished. No distress.  HENT:  Head: Normocephalic and atraumatic.  Right Ear: External ear normal.  Left Ear: External ear normal.  Mouth/Throat: Oropharynx is clear and moist.  Eyes: Conjunctivae and EOM are normal. Pupils are equal, round, and reactive to light.  Neck: Normal range of motion. Neck supple. No thyromegaly present.  Cardiovascular: Normal rate, regular rhythm and normal heart sounds.   No murmur heard. Pulmonary/Chest: No respiratory distress. He has no wheezes. He has no rales.  Abdominal: Soft. Bowel sounds are normal. He exhibits no distension and no mass. There is no tenderness. There is no rebound and no guarding.  Musculoskeletal: He exhibits no edema.  Lymphadenopathy:    He has no cervical adenopathy.  Neurological: He is alert and oriented to person, place, and time. He displays normal reflexes. No cranial nerve deficit.  Skin: No rash noted.  Psychiatric: He has a normal mood and affect.       Assessment:     Physical exam. Patient has chronic thrombocytopenia which has been stable.    Plan:     -Flu vaccine offered and declined -Labs reviewed. No major abnormalities. Mild hyperlipidemia. 10 year risk  of CAD event 3.5%.  No indication for statin use.   -Check hepatitis C antibody.  Prior HIV screening negative.  Joseph CoveyBruce W Takita Riecke MD [facility]

## 2016-10-30 ENCOUNTER — Other Ambulatory Visit: Payer: BLUE CROSS/BLUE SHIELD

## 2016-10-30 ENCOUNTER — Encounter: Payer: Self-pay | Admitting: Family Medicine

## 2016-10-30 ENCOUNTER — Ambulatory Visit: Payer: BLUE CROSS/BLUE SHIELD | Admitting: Oncology

## 2016-10-30 LAB — HEPATITIS C ANTIBODY: HCV Ab: NEGATIVE

## 2016-11-06 ENCOUNTER — Telehealth: Payer: Self-pay | Admitting: Oncology

## 2016-11-06 ENCOUNTER — Other Ambulatory Visit: Payer: BLUE CROSS/BLUE SHIELD

## 2016-11-06 ENCOUNTER — Ambulatory Visit: Payer: BLUE CROSS/BLUE SHIELD | Admitting: Oncology

## 2016-11-06 NOTE — Telephone Encounter (Signed)
11/29 appointment rescheduled per patient request. The appointment time for 11/29 did not work well with the patient's work schedule. Called nurse to notify of schedule change.

## 2016-11-21 ENCOUNTER — Other Ambulatory Visit: Payer: Self-pay | Admitting: Family Medicine

## 2016-12-03 ENCOUNTER — Other Ambulatory Visit: Payer: Self-pay | Admitting: Family Medicine

## 2016-12-03 ENCOUNTER — Ambulatory Visit: Payer: BLUE CROSS/BLUE SHIELD | Admitting: Oncology

## 2016-12-03 ENCOUNTER — Other Ambulatory Visit: Payer: BLUE CROSS/BLUE SHIELD

## 2016-12-04 ENCOUNTER — Ambulatory Visit (HOSPITAL_BASED_OUTPATIENT_CLINIC_OR_DEPARTMENT_OTHER): Payer: BLUE CROSS/BLUE SHIELD

## 2016-12-04 ENCOUNTER — Ambulatory Visit: Payer: BLUE CROSS/BLUE SHIELD | Admitting: Oncology

## 2016-12-04 ENCOUNTER — Telehealth: Payer: Self-pay | Admitting: Oncology

## 2016-12-04 VITALS — BP 124/64 | HR 87 | Temp 97.7°F | Resp 16 | Ht 69.0 in | Wt 170.6 lb

## 2016-12-04 DIAGNOSIS — D696 Thrombocytopenia, unspecified: Secondary | ICD-10-CM | POA: Diagnosis not present

## 2016-12-04 LAB — CBC WITH DIFFERENTIAL/PLATELET
BASO%: 0.5 % (ref 0.0–2.0)
BASOS ABS: 0 10*3/uL (ref 0.0–0.1)
EOS ABS: 0.1 10*3/uL (ref 0.0–0.5)
EOS%: 1.6 % (ref 0.0–7.0)
HEMATOCRIT: 47.3 % (ref 38.4–49.9)
HGB: 16.2 g/dL (ref 13.0–17.1)
LYMPH%: 42.8 % (ref 14.0–49.0)
MCH: 28 pg (ref 27.2–33.4)
MCHC: 34.2 g/dL (ref 32.0–36.0)
MCV: 81.7 fL (ref 79.3–98.0)
MONO#: 0.5 10*3/uL (ref 0.1–0.9)
MONO%: 10.2 % (ref 0.0–14.0)
NEUT%: 44.9 % (ref 39.0–75.0)
NEUTROS ABS: 2 10*3/uL (ref 1.5–6.5)
PLATELETS: 69 10*3/uL — AB (ref 140–400)
RBC: 5.79 10*6/uL (ref 4.20–5.82)
RDW: 13.9 % (ref 11.0–14.6)
WBC: 4.4 10*3/uL (ref 4.0–10.3)
lymph#: 1.9 10*3/uL (ref 0.9–3.3)
nRBC: 0 % (ref 0–0)

## 2016-12-04 NOTE — Telephone Encounter (Signed)
Appointments scheduled per 12/04/16 los. Patient was given a copy of the AVS report and appointment schedule, per 12/04/16 los. ° °

## 2016-12-04 NOTE — Progress Notes (Signed)
Hematology and Oncology Follow Up Visit  Joseph Lindsey [id number] [birth year] 45 y.o. 12/04/2016 11:18 AM  CC: [name], M.D.    Principle Diagnosis: This is a 45 year old gentleman with thrombocytopenia diagnosed in 2011. Etiologies  Including autoimmune thrombocytopenia such as idiopathic thrombocytopenic purpura. His workup did not reveal any malignancy or splenomegaly.   Current therapy: Observation and  surveillance.  Interim History:   [name] presents today for a follow-up visit. Since the last visit, he reports no recent complaints. He did not report any active bleeding including hematochezia,melena, epistaxis or easy bruisability. He has not reported any recent illnesses or infections. He did not report any change in his medications. He continues to work full-time without any changes in his performance status. He is planning to have a dental implant to be done in the near future.   He does not report any constitutional symptoms such as fevers or chills or sweats. He does not report any weight loss or appetite changes. He did not report any chest pain or difficulty breathing.  He had not reported any major changes in his performance status or activity level. He does not report any chest pain, palpitation or leg edema. He does not report any cough or wheezing. He does not report any abdominal pain, constipation, diarrhea or distention. Did not report any frequency urgency or hesitancy. He reports no arthralgias or myalgias. The review of systems otherwise unremarkable.  Medications: I have reviewed the patient's current medications. Current Outpatient Prescriptions  Medication Sig Dispense Refill  . fexofenadine (ALLEGRA) 180 MG tablet Take 180 mg by mouth daily.    Marland Kitchen. levothyroxine (SYNTHROID, LEVOTHROID) 88 MCG tablet TAKE 1 TABLET DAILY (NEED TO SCHEDULE PHYSICAL EXAM) 90 tablet 1  . triamcinolone cream (KENALOG) 0.1 % APPLY EXTERNALLY TO THE AFFECTED AREA TWICE DAILY 30  g 0   No current facility-administered medications for this visit.     Allergies: No Known Allergies  Past Medical History, Surgical history, Social history, and Family History were reviewed and updated.   Physical Exam: Blood pressure 124/64, pulse 87, temperature 97.7 F (36.5 C), temperature source Oral, resp. rate 16, height 5\' 9"  (1.753 m), weight 170 lb 9.6 oz (77.4 kg), SpO2 100 %. ECOG: 0 General appearance: Alert, awake gentleman without distress. Head: Normocephalic, without obvious abnormality no oral thrush or bleeding. Neck: no adenopathy Lymph nodes: Cervical, supraclavicular, and axillary nodes normal. Heart:regular rate and rhythm, S1, S2 normal, no murmur, click, rub or gallop Lung:chest clear, no wheezing, rales, normal symmetric air entry Abdomin: soft, non-tender, without masses or organomegaly rebound or guarding. No splenomegaly. EXT:no erythema, induration, or nodules   Lab Results: Lab Results  Component Value Date   WBC 4.4 12/04/2016   HGB 16.2 12/04/2016   HCT 47.3 12/04/2016   MCV 81.7 12/04/2016   PLT 69 (L) 12/04/2016     Chemistry      Component Value Date/Time   NA 141 10/03/2016 1059   NA 140 08/17/2015 1114   K 4.2 10/03/2016 1059   K 4.1 08/17/2015 1114   CL 102 10/03/2016 1059   CL 106 12/15/2012 0948   CO2 30 10/03/2016 1059   CO2 26 08/17/2015 1114   BUN 15 10/03/2016 1059   BUN 10.3 08/17/2015 1114   CREATININE 1.16 10/03/2016 1059   CREATININE 1.2 08/17/2015 1114      Component Value Date/Time   CALCIUM 10.1 10/03/2016 1059   CALCIUM 9.4 08/17/2015 1114   ALKPHOS 63 10/03/2016 1059  ALKPHOS 67 08/17/2015 1114   AST 25 10/03/2016 1059   AST 24 08/17/2015 1114   ALT 27 10/03/2016 1059   ALT 30 08/17/2015 1114   BILITOT 0.6 10/03/2016 1059   BILITOT 0.60 08/17/2015 1114      Impression and Plan: This is a pleasant, 45 year old gentleman with the following issues:  1. Thrombocytopenia: His platelet count today  continue to be relatively stable. His platelet count is 57 without any signs or symptoms of bleeding. The etiology due to immune thrombocytopenia and prednisone therapy would be indicated if his platelet counts drop below 50 or he has active bleeding.  2. Plan dental procedure: His platelet count is more than adequate to sustain such minor procedure at this time. No need for steroid trial. 3. Followup: 6 months or sooner if needed to.   SHADAD,FIRAS, MD 12/27/201711:18 AM

## 2016-12-13 ENCOUNTER — Other Ambulatory Visit: Payer: Self-pay | Admitting: *Deleted

## 2016-12-13 MED ORDER — TRIAMCINOLONE ACETONIDE 0.1 % EX CREA: TOPICAL_CREAM | CUTANEOUS | 0 refills | Status: DC

## 2017-06-03 ENCOUNTER — Other Ambulatory Visit: Payer: Self-pay | Admitting: Family Medicine

## 2017-06-04 ENCOUNTER — Telehealth: Payer: Self-pay | Admitting: Oncology

## 2017-06-04 ENCOUNTER — Other Ambulatory Visit (HOSPITAL_BASED_OUTPATIENT_CLINIC_OR_DEPARTMENT_OTHER): Payer: BLUE CROSS/BLUE SHIELD

## 2017-06-04 ENCOUNTER — Ambulatory Visit (HOSPITAL_BASED_OUTPATIENT_CLINIC_OR_DEPARTMENT_OTHER): Payer: BLUE CROSS/BLUE SHIELD | Admitting: Oncology

## 2017-06-04 DIAGNOSIS — D696 Thrombocytopenia, unspecified: Secondary | ICD-10-CM

## 2017-06-04 LAB — CBC WITH DIFFERENTIAL/PLATELET
BASO%: 0.9 % (ref 0.0–2.0)
Basophils Absolute: 0 10*3/uL (ref 0.0–0.1)
EOS ABS: 0.1 10*3/uL (ref 0.0–0.5)
EOS%: 1.8 % (ref 0.0–7.0)
HEMATOCRIT: 47 % (ref 38.4–49.9)
HGB: 15.6 g/dL (ref 13.0–17.1)
LYMPH%: 33.1 % (ref 14.0–49.0)
MCH: 27.5 pg (ref 27.2–33.4)
MCHC: 33.1 g/dL (ref 32.0–36.0)
MCV: 83.1 fL (ref 79.3–98.0)
MONO#: 0.4 10*3/uL (ref 0.1–0.9)
MONO%: 10.6 % (ref 0.0–14.0)
NEUT%: 53.6 % (ref 39.0–75.0)
NEUTROS ABS: 2.3 10*3/uL (ref 1.5–6.5)
RBC: 5.65 10*6/uL (ref 4.20–5.82)
RDW: 14 % (ref 11.0–14.6)
WBC: 4.2 10*3/uL (ref 4.0–10.3)
lymph#: 1.4 10*3/uL (ref 0.9–3.3)

## 2017-06-04 NOTE — Progress Notes (Signed)
Hematology and Oncology Follow Up Visit  Joseph Lindsey [id number] Jul 14, 1971 46 y.o. 06/04/2017 9:56 AM  CC: [name], M.D.    Principle Diagnosis: 46 year old gentleman with thrombocytopenia diagnosed in 2011. Etiologies  Including autoimmune thrombocytopenia such as idiopathic thrombocytopenic purpura. His workup did not reveal any malignancy or splenomegaly.   Current therapy: Observation and  surveillance.  Interim History:   Joseph Lindsey presents today for a follow-up visit. Since the last visit, he continues to do well without any issues. He did not report any active bleeding including hematochezia,melena, epistaxis or easy bruisability. He has not reported any recent illnesses or infections. He did not report any change in his medications. He continues to work full-time without any changes in his performance status. He denied any decline in energy or any decline in ability to perform activities of daily living. She is up-to-date 2 age-appropriate cancer screening.   He does not report any constitutional symptoms such as fevers or chills or sweats. He does not report any weight loss or appetite changes. He did not report any chest pain or difficulty breathing.  He had not reported any major changes in his performance status or activity level. He does not report any chest pain, palpitation or leg edema. He does not report any cough or wheezing. He does not report any abdominal pain, constipation, diarrhea or distention. Did not report any frequency urgency or hesitancy. He reports no arthralgias or myalgias. The review of systems otherwise unremarkable.  Medications: I have reviewed the patient's current medications. Current Outpatient Prescriptions  Medication Sig Dispense Refill  . fexofenadine (ALLEGRA) 180 MG tablet Take 180 mg by mouth daily.    Marland Kitchen. levothyroxine (SYNTHROID, LEVOTHROID) 88 MCG tablet TAKE 1 TABLET DAILY (NEED TO SCHEDULE PHYSICAL EXAM) 90 tablet 0  .  triamcinolone cream (KENALOG) 0.1 % APPLY EXTERNALLY TO THE AFFECTED AREA TWICE A DAY 30 g 0   No current facility-administered medications for this visit.     Allergies: No Known Allergies  Past Medical History, Surgical history, Social history, and Family History were reviewed and updated.   Physical Exam: Vital specially reviewed today and appears within normal range. ECOG: 0 General appearance: Well-appearing gentleman appeared without distress. Head: Normocephalic, without obvious abnormality no oral ulcers or thrush. Neck: no adenopathy Lymph nodes: Cervical, supraclavicular, and axillary nodes normal. Heart:regular rate and rhythm, S1, S2 normal, no murmur, click, rub or gallop Lung:chest clear, no wheezing, rales, normal symmetric air entry Abdomin: soft, non-tender, without masses or organomegaly no shifting dullness or ascites. No masses. EXT:no erythema, induration, or nodules   Lab Results: Lab Results  Component Value Date   WBC 4.4 12/04/2016   HGB 16.2 12/04/2016   HCT 47.3 12/04/2016   MCV 81.7 12/04/2016   PLT 69 (L) 12/04/2016     Chemistry      Component Value Date/Time   NA 141 10/03/2016 1059   NA 140 08/17/2015 1114   K 4.2 10/03/2016 1059   K 4.1 08/17/2015 1114   CL 102 10/03/2016 1059   CL 106 12/15/2012 0948   CO2 30 10/03/2016 1059   CO2 26 08/17/2015 1114   BUN 15 10/03/2016 1059   BUN 10.3 08/17/2015 1114   CREATININE 1.16 10/03/2016 1059   CREATININE 1.2 08/17/2015 1114      Component Value Date/Time   CALCIUM 10.1 10/03/2016 1059   CALCIUM 9.4 08/17/2015 1114   ALKPHOS 63 10/03/2016 1059   ALKPHOS 67 08/17/2015 1114   AST 25 10/03/2016  1059   AST 24 08/17/2015 1114   ALT 27 10/03/2016 1059   ALT 30 08/17/2015 1114   BILITOT 0.6 10/03/2016 1059   BILITOT 0.60 08/17/2015 1114      Impression and Plan: 46 year old gentleman with the following issues:  1. Thrombocytopenia: The etiology of his thrombocytopenia appears to be.  His platelet count today continue to be relatively stable between 50-60,000. He has no signs or symptoms of bleeding at this time and I have elected to continue with observation. 2. Plan dental procedure: This was completed without incident at this time. 3. Followup: 6 months or sooner if needed to.   [facility], MD 6/27/20189:56 AM

## 2017-06-04 NOTE — Telephone Encounter (Signed)
Gave patient avs report and appointments for January 2019.  °

## 2017-07-28 ENCOUNTER — Telehealth: Payer: Self-pay

## 2017-07-28 ENCOUNTER — Encounter: Payer: Self-pay | Admitting: Family Medicine

## 2017-07-28 ENCOUNTER — Ambulatory Visit (INDEPENDENT_AMBULATORY_CARE_PROVIDER_SITE_OTHER): Payer: BLUE CROSS/BLUE SHIELD | Admitting: Family Medicine

## 2017-07-28 VITALS — BP 110/70 | HR 60 | Temp 98.3°F | Ht 69.75 in | Wt 172.9 lb

## 2017-07-28 DIAGNOSIS — Z0000 Encounter for general adult medical examination without abnormal findings: Secondary | ICD-10-CM

## 2017-07-28 DIAGNOSIS — Z125 Encounter for screening for malignant neoplasm of prostate: Secondary | ICD-10-CM | POA: Diagnosis not present

## 2017-07-28 LAB — PSA: PSA: 0.55 ng/mL (ref 0.10–4.00)

## 2017-07-28 LAB — BASIC METABOLIC PANEL
BUN: 13 mg/dL (ref 6–23)
CHLORIDE: 101 meq/L (ref 96–112)
CO2: 29 mEq/L (ref 19–32)
Calcium: 9.6 mg/dL (ref 8.4–10.5)
Creatinine, Ser: 1.15 mg/dL (ref 0.40–1.50)
GFR: 88.31 mL/min (ref >60.00)
Glucose, Bld: 76 mg/dL (ref 70–99)
POTASSIUM: 4 meq/L (ref 3.5–5.1)
SODIUM: 137 meq/L (ref 135–145)

## 2017-07-28 LAB — HEPATIC FUNCTION PANEL
ALK PHOS: 60 U/L (ref 39–117)
ALT: 19 U/L (ref 0–53)
AST: 20 U/L (ref 0–37)
Albumin: 4 g/dL (ref 3.5–5.2)
BILIRUBIN DIRECT: 0.1 mg/dL (ref 0.0–0.3)
Total Bilirubin: 0.6 mg/dL (ref 0.2–1.2)
Total Protein: 7.1 g/dL (ref 6.0–8.3)

## 2017-07-28 LAB — CBC WITH DIFFERENTIAL/PLATELET
BASOS ABS: 0 10*3/uL (ref 0.0–0.1)
Basophils Relative: 0.4 % (ref 0.0–3.0)
EOS PCT: 1.2 % (ref 0.0–5.0)
Eosinophils Absolute: 0 10*3/uL (ref 0.0–0.7)
HEMATOCRIT: 48.5 % (ref 39.0–52.0)
HEMOGLOBIN: 16.1 g/dL (ref 13.0–17.0)
LYMPHS PCT: 36.5 % (ref 12.0–46.0)
Lymphs Abs: 1.5 10*3/uL (ref 0.7–4.0)
MCHC: 33.2 g/dL (ref 30.0–36.0)
MCV: 84.5 fl (ref 78.0–100.0)
MONOS PCT: 11 % (ref 3.0–12.0)
Monocytes Absolute: 0.4 10*3/uL (ref 0.1–1.0)
NEUTROS PCT: 50.9 % (ref 43.0–77.0)
Neutro Abs: 2.1 10*3/uL (ref 1.4–7.7)
Platelets: 37 10*3/uL — CL (ref 150.0–400.0)
RBC: 5.75 Mil/uL (ref 4.22–5.81)
RDW: 14.4 % (ref 11.5–15.5)
WBC: 4 10*3/uL (ref 4.0–10.5)

## 2017-07-28 LAB — LIPID PANEL
CHOLESTEROL: 222 mg/dL — AB (ref 0–200)
HDL: 48.7 mg/dL (ref >39.00)
LDL CALC: 159 mg/dL — AB (ref 0–99)
NonHDL: 173.4
Total CHOL/HDL Ratio: 5
Triglycerides: 71 mg/dL (ref 0.0–149.0)
VLDL: 14.2 mg/dL (ref 0.0–40.0)

## 2017-07-28 LAB — TSH: TSH: 1.37 u[IU]/mL (ref 0.35–4.50)

## 2017-07-28 NOTE — Patient Instructions (Signed)
Consider over the counter Vit D 1,000 to 2,000 IU per day.

## 2017-07-28 NOTE — Progress Notes (Signed)
Subjective:     Patient ID: Joseph Lindsey, male   DOB: [birth year], 46 y.o.   MRN: [id number]  HPI Patient seen for physical exam. He has history of chronic thrombocytopenia and hypothyroidism. Is compliant with therapy. Generally feels well. No consistent exercise. Father had type 2 diabetes and he had grandfather with prostate cancer. He is requesting PSA screening. Tetanus up-to-date. He is followed by hematology regarding his chronic thrombocytopenia.  Past Medical History:  Diagnosis Date  . Fissure, anal    2003  . Migraines   . Primary thrombocytopenia, unspecified   . Thyroid disease    hypothyroid   Past Surgical History:  Procedure Laterality Date  . BREATH TEK H PYLORI N/A 11/11/2013   Procedure: BREATH TEK H PYLORI;  Surgeon: Beverley Fiedler, MD;  Location: [facility];  Service: Gastroenterology;  Laterality: N/A;    reports that he has never smoked. He has never used smokeless tobacco. He reports that he drinks alcohol. He reports that he does not use drugs. family history includes Asthma in his brother, father, and sister; Cancer (age of onset: 35) in his brother; Diabetes in his father and sister; Hypertension in his father; Sickle cell trait in his unknown relative; Stomach cancer in his brother. No Known Allergies   Review of Systems  Constitutional: Negative for activity change, appetite change, fatigue and fever.  HENT: Negative for congestion, ear pain and trouble swallowing.   Eyes: Negative for pain and visual disturbance.  Respiratory: Negative for cough, shortness of breath and wheezing.   Cardiovascular: Negative for chest pain and palpitations.  Gastrointestinal: Negative for abdominal distention, abdominal pain, blood in stool, constipation, diarrhea, nausea, rectal pain and vomiting.  Genitourinary: Negative for dysuria, hematuria and testicular pain.  Musculoskeletal: Negative for arthralgias and joint swelling.  Skin: Negative for rash.  Neurological:  Negative for dizziness, syncope and headaches.  Hematological: Negative for adenopathy.  Psychiatric/Behavioral: Negative for confusion and dysphoric mood.       Objective:   Physical Exam  Constitutional: He is oriented to person, place, and time. He appears well-developed and well-nourished. No distress.  HENT:  Head: Normocephalic and atraumatic.  Right Ear: External ear normal.  Left Ear: External ear normal.  Mouth/Throat: Oropharynx is clear and moist.  Eyes: Pupils are equal, round, and reactive to light. Conjunctivae and EOM are normal.  Neck: Normal range of motion. Neck supple. No thyromegaly present.  Cardiovascular: Normal rate, regular rhythm and normal heart sounds.   No murmur heard. Pulmonary/Chest: No respiratory distress. He has no wheezes. He has no rales.  Abdominal: Soft. Bowel sounds are normal. He exhibits no distension and no mass. There is no tenderness. There is no rebound and no guarding.  Musculoskeletal: He exhibits no edema.  Lymphadenopathy:    He has no cervical adenopathy.  Neurological: He is alert and oriented to person, place, and time. He displays normal reflexes. No cranial nerve deficit.  Skin: No rash noted.  Psychiatric: He has a normal mood and affect.       Assessment:     Physical exam. Patient has chronic stable thrombocytopenia and hypothyroidism    Plan:     -Obtain screening lab work. Patient requesting PSA given family history -Continue Synthroid and adjust dosage as necessary -Consider over-the-counter vitamin D supplement  Kristian Covey MD [facility]

## 2017-07-28 NOTE — Telephone Encounter (Signed)
He has chronic thrombocytopenia followed by hematology and this is stable for him.

## 2017-07-28 NOTE — Telephone Encounter (Signed)
CALL REPORT for pt's Platelets   Platelets 150.0 - 400.0 K/uL 37.0 Repeated and verifie...       Dr. Caryl Never - [place] Childes

## 2017-08-28 ENCOUNTER — Encounter: Payer: Self-pay | Admitting: Family Medicine

## 2017-09-06 ENCOUNTER — Other Ambulatory Visit: Payer: Self-pay | Admitting: Family Medicine

## 2017-11-05 ENCOUNTER — Ambulatory Visit (INDEPENDENT_AMBULATORY_CARE_PROVIDER_SITE_OTHER): Payer: BLUE CROSS/BLUE SHIELD | Admitting: Family Medicine

## 2017-11-05 ENCOUNTER — Encounter: Payer: Self-pay | Admitting: Family Medicine

## 2017-11-05 VITALS — BP 102/60 | HR 58 | Temp 98.3°F | Wt 170.4 lb

## 2017-11-05 DIAGNOSIS — R05 Cough: Secondary | ICD-10-CM | POA: Diagnosis not present

## 2017-11-05 DIAGNOSIS — R059 Cough, unspecified: Secondary | ICD-10-CM

## 2017-11-05 NOTE — Patient Instructions (Signed)
Upper Respiratory Infection, Adult Most upper respiratory infections (URIs) are a viral infection of the air passages leading to the lungs. A URI affects the nose, throat, and upper air passages. The most common type of URI is nasopharyngitis and is typically referred to as "the common cold." URIs run their course and usually go away on their own. Most of the time, a URI does not require medical attention, but sometimes a bacterial infection in the upper airways can follow a viral infection. This is called a secondary infection. Sinus and middle ear infections are common types of secondary upper respiratory infections. Bacterial pneumonia can also complicate a URI. A URI can worsen asthma and chronic obstructive pulmonary disease (COPD). Sometimes, these complications can require emergency medical care and may be life threatening. What are the causes? Almost all URIs are caused by viruses. A virus is a type of germ and can spread from one person to another. What increases the risk? You may be at risk for a URI if:  You smoke.  You have chronic heart or lung disease.  You have a weakened defense (immune) system.  You are very young or very old.  You have nasal allergies or asthma.  You work in crowded or poorly ventilated areas.  You work in health care facilities or schools.  What are the signs or symptoms? Symptoms typically develop 2-3 days after you come in contact with a cold virus. Most viral URIs last 7-10 days. However, viral URIs from the influenza virus (flu virus) can last 14-18 days and are typically more severe. Symptoms may include:  Runny or stuffy (congested) nose.  Sneezing.  Cough.  Sore throat.  Headache.  Fatigue.  Fever.  Loss of appetite.  Pain in your forehead, behind your eyes, and over your cheekbones (sinus pain).  Muscle aches.  How is this diagnosed? Your health care provider may diagnose a URI by:  Physical exam.  Tests to check that your  symptoms are not due to another condition such as: ? Strep throat. ? Sinusitis. ? Pneumonia. ? Asthma.  How is this treated? A URI goes away on its own with time. It cannot be cured with medicines, but medicines may be prescribed or recommended to relieve symptoms. Medicines may help:  Reduce your fever.  Reduce your cough.  Relieve nasal congestion.  Follow these instructions at home:  Take medicines only as directed by your health care provider.  Gargle warm saltwater or take cough drops to comfort your throat as directed by your health care provider.  Use a warm mist humidifier or inhale steam from a shower to increase air moisture. This may make it easier to breathe.  Drink enough fluid to keep your urine clear or pale yellow.  Eat soups and other clear broths and maintain good nutrition.  Rest as needed.  Return to work when your temperature has returned to normal or as your health care provider advises. You may need to stay home longer to avoid infecting others. You can also use a face mask and careful hand washing to prevent spread of the virus.  Increase the usage of your inhaler if you have asthma.  Do not use any tobacco products, including cigarettes, chewing tobacco, or electronic cigarettes. If you need help quitting, ask your health care provider. How is this prevented? The best way to protect yourself from getting a cold is to practice good hygiene.  Avoid oral or hand contact with people with cold symptoms.  Wash your   hands often if contact occurs.  There is no clear evidence that vitamin C, vitamin E, echinacea, or exercise reduces the chance of developing a cold. However, it is always recommended to get plenty of rest, exercise, and practice good nutrition. Contact a health care provider if:  You are getting worse rather than better.  Your symptoms are not controlled by medicine.  You have chills.  You have worsening shortness of breath.  You have  brown or red mucus.  You have yellow or brown nasal discharge.  You have pain in your face, especially when you bend forward.  You have a fever.  You have swollen neck glands.  You have pain while swallowing.  You have white areas in the back of your throat. Get help right away if:  You have severe or persistent: ? Headache. ? Ear pain. ? Sinus pain. ? Chest pain.  You have chronic lung disease and any of the following: ? Wheezing. ? Prolonged cough. ? Coughing up blood. ? A change in your usual mucus.  You have a stiff neck.  You have changes in your: ? Vision. ? Hearing. ? Thinking. ? Mood. This information is not intended to replace advice given to you by your health care provider. Make sure you discuss any questions you have with your health care provider. Document Released: 05/21/2001 Document Revised: 07/28/2016 Document Reviewed: 03/02/2014 [employer] Interactive Patient Education  2017 [employer]  

## 2017-11-05 NOTE — Progress Notes (Signed)
Subjective:     Patient ID: Joseph Lindsey, male   DOB: 04-09-1971, 46 y.o.   MRN: [id number]  HPI Patient seen as a work in with onset last Tuesday of headache, nasal congestion, cough, sore throat, fatigue. He feels somewhat better today. He had some laryngitis last week and that is slowly improving. Still has mild sore throat. Headache has resolved. No fever. One of his children had similar symptoms last week. Denies any nausea, vomiting, or diarrhea  Past Medical History:  Diagnosis Date  . Fissure, anal    2003  . Migraines   . Primary thrombocytopenia, unspecified   . Thyroid disease    hypothyroid   Past Surgical History:  Procedure Laterality Date  . BREATH TEK H PYLORI N/A 11/11/2013   Procedure: BREATH TEK H PYLORI;  Surgeon: Beverley FiedlerJay M Pyrtle, MD;  Location: [facility];  Service: Gastroenterology;  Laterality: N/A;    reports that  has never smoked. he has never used smokeless tobacco. He reports that he drinks alcohol. He reports that he does not use drugs. family history includes Asthma in his brother, father, and sister; Cancer (age of onset: 3044) in his brother; Diabetes in his father and sister; Hypertension in his father; Sickle cell trait in his unknown relative; Stomach cancer in his brother. No Known Allergies   Review of Systems  Constitutional: Positive for fatigue. Negative for chills and fever.  HENT: Positive for congestion and sore throat.   Respiratory: Positive for cough.        Objective:   Physical Exam  Constitutional: He appears well-developed and well-nourished.  HENT:  Right Ear: External ear normal.  Left Ear: External ear normal.  Mouth/Throat: Oropharynx is clear and moist.  Neck: Neck supple.  Cardiovascular: Normal rate and regular rhythm.  Pulmonary/Chest: Effort normal and breath sounds normal. No respiratory distress. He has no wheezes. He has no rales.  Lymphadenopathy:    He has no cervical adenopathy.       Assessment:      Probable viral URI with cough.    Plan:     -Treat symptomatically with fluids, rest, consider over-the-counter plain Mucinex. Tylenol as needed for sore throat symptoms. Follow-up promptly for any fever or worsening symptoms  Joseph CoveyBruce W Oberon Hehir MD [facility]

## 2017-12-02 ENCOUNTER — Other Ambulatory Visit: Payer: Self-pay | Admitting: Family Medicine

## 2017-12-10 ENCOUNTER — Other Ambulatory Visit: Payer: Self-pay | Admitting: Family Medicine

## 2017-12-11 ENCOUNTER — Other Ambulatory Visit: Payer: BLUE CROSS/BLUE SHIELD

## 2017-12-11 ENCOUNTER — Ambulatory Visit: Payer: BLUE CROSS/BLUE SHIELD | Admitting: Oncology

## 2017-12-12 ENCOUNTER — Ambulatory Visit (HOSPITAL_BASED_OUTPATIENT_CLINIC_OR_DEPARTMENT_OTHER): Payer: BLUE CROSS/BLUE SHIELD | Admitting: Oncology

## 2017-12-12 ENCOUNTER — Other Ambulatory Visit (HOSPITAL_BASED_OUTPATIENT_CLINIC_OR_DEPARTMENT_OTHER): Payer: BLUE CROSS/BLUE SHIELD

## 2017-12-12 ENCOUNTER — Telehealth: Payer: Self-pay | Admitting: Oncology

## 2017-12-12 ENCOUNTER — Telehealth: Payer: Self-pay | Admitting: Family Medicine

## 2017-12-12 VITALS — BP 123/75 | HR 55 | Temp 98.6°F | Resp 18 | Ht 69.75 in | Wt 172.7 lb

## 2017-12-12 DIAGNOSIS — D696 Thrombocytopenia, unspecified: Secondary | ICD-10-CM | POA: Diagnosis not present

## 2017-12-12 LAB — CBC WITH DIFFERENTIAL/PLATELET
BASO%: 0.6 % (ref 0.0–2.0)
BASOS ABS: 0 10*3/uL (ref 0.0–0.1)
EOS ABS: 0.1 10*3/uL (ref 0.0–0.5)
EOS%: 1 % (ref 0.0–7.0)
HCT: 45.8 % (ref 38.4–49.9)
HGB: 15.4 g/dL (ref 13.0–17.1)
LYMPH#: 1.8 10*3/uL (ref 0.9–3.3)
LYMPH%: 35.4 % (ref 14.0–49.0)
MCH: 28.2 pg (ref 27.2–33.4)
MCHC: 33.6 g/dL (ref 32.0–36.0)
MCV: 83.7 fL (ref 79.3–98.0)
MONO#: 0.4 10*3/uL (ref 0.1–0.9)
MONO%: 8.5 % (ref 0.0–14.0)
NEUT%: 54.5 % (ref 39.0–75.0)
NEUTROS ABS: 2.7 10*3/uL (ref 1.5–6.5)
NRBC: 0 % (ref 0–0)
PLATELETS: 59 10*3/uL — AB (ref 140–400)
RBC: 5.47 10*6/uL (ref 4.20–5.82)
RDW: 14 % (ref 11.0–14.6)
WBC: 4.9 10*3/uL (ref 4.0–10.3)

## 2017-12-12 MED ORDER — OSELTAMIVIR PHOSPHATE 75 MG PO CAPS: 75.0000 mg | ORAL_CAPSULE | Freq: Every day | ORAL | 0 refills | Status: DC

## 2017-12-12 NOTE — Telephone Encounter (Signed)
May send in Tamiflu 75 mg po qd for 10 days for prophylaxis.

## 2017-12-12 NOTE — Progress Notes (Signed)
Hematology and Oncology Follow Up Visit  [name] [id number] 1971/08/06 47 y.o. 12/12/2017 9:59 AM  CC: [name], M.D.    Principle Diagnosis: 47 year old man with thrombocytopenia diagnosed in 2011. Etiologies  Including autoimmune thrombocytopenia such as idiopathic thrombocytopenic purpura. His workup did not reveal any malignancy or splenomegaly.   Current therapy: Observation and  surveillance.  Interim History:   [name] presents today for a follow-up visit. Since the last visit, he reports no recent changes in his health. He did not report any active bleeding including hematochezia,melena, epistaxis or easy bruisability. He continues to work full-time without any changes in his performance status. He denied any decline in energy or any decline in ability to perform activities of daily living. He is planning to have dental implants in the near future.   He does not report any constitutional symptoms such as fevers or chills or sweats. He does not report any weight loss or appetite changes. He did not report any chest pain or difficulty breathing.  He had not reported any major changes in his performance status or activity level. He does not report any chest pain, palpitation or leg edema. He does not report any cough or wheezing. He does not report any abdominal pain, constipation, diarrhea or distention. Did not report any frequency urgency or hesitancy. He reports no arthralgias or myalgias. The review of systems otherwise unremarkable.  Medications: I have reviewed the patient's current medications. Current Outpatient Medications  Medication Sig Dispense Refill  . fexofenadine (ALLEGRA) 180 MG tablet Take 180 mg by mouth daily.    Marland Kitchen levothyroxine (SYNTHROID, LEVOTHROID) 88 MCG tablet TAKE 1 TABLET DAILY (NEED TO SCHEDULE PHYSICAL EXAM) 90 tablet 2  . triamcinolone cream (KENALOG) 0.1 % APPLY EXTERNALLY TO THE AFFECTED AREA TWICE A DAY 30 g 0   No current  facility-administered medications for this visit.     Allergies: No Known Allergies  Past Medical History, Surgical history, Social history, and Family History were reviewed and updated.   Physical Exam: Blood pressure 123/75, pulse (!) 55, temperature 98.6 F (37 C), temperature source Oral, resp. rate 18, height 5' 9.75" (1.772 m), weight 172 lb 11.2 oz (78.3 kg), SpO2 100 %.   ECOG: 0 General appearance: Alert, weight without distress. Head: Normocephalic, without obvious abnormality no oral thrush or ulcers. Neck: no adenopathy Lymph nodes: Cervical, supraclavicular, and axillary nodes normal. Heart:regular rate and rhythm, S1, S2 normal, no murmur, click, rub or gallop Lung:chest clear, no wheezing, rales, normal symmetric air entry Abdomin: soft, non-tender, without masses or organomegaly no rebound or guarding. EXT:no erythema, induration, or nodules   Lab Results: Lab Results  Component Value Date   WBC 4.9 12/12/2017   HGB 15.4 12/12/2017   HCT 45.8 12/12/2017   MCV 83.7 12/12/2017   PLT 59 (L) 12/12/2017     Chemistry      Component Value Date/Time   NA 137 07/28/2017 1053   NA 140 08/17/2015 1114   K 4.0 07/28/2017 1053   K 4.1 08/17/2015 1114   CL 101 07/28/2017 1053   CL 106 12/15/2012 0948   CO2 29 07/28/2017 1053   CO2 26 08/17/2015 1114   BUN 13 07/28/2017 1053   BUN 10.3 08/17/2015 1114   CREATININE 1.15 07/28/2017 1053   CREATININE 1.2 08/17/2015 1114      Component Value Date/Time   CALCIUM 9.6 07/28/2017 1053   CALCIUM 9.4 08/17/2015 1114   ALKPHOS 60 07/28/2017 1053   ALKPHOS 67  08/17/2015 1114   AST 20 07/28/2017 1053   AST 24 08/17/2015 1114   ALT 19 07/28/2017 1053   ALT 30 08/17/2015 1114   BILITOT 0.6 07/28/2017 1053   BILITOT 0.60 08/17/2015 1114      Impression and Plan: 47 year old gentleman with the following issues:  1. Thrombocytopenia: The etiology of his thrombocytopenia is unclear. Immune thrombocytopenia is  possibility. His platelet count remains above 50,000 without any need for intervention. Plan is to continue follow-up periodically and used steroids if needed to 51 develops any active bleeding. 2. Plan dental procedure: Dental procedures were without any bleeding complications. I do not anticipate any need for intervention before his upcoming dental implants. 3. Followup: 6 months or sooner if needed to.   [name], MD 1/4/20199:59 AM

## 2017-12-12 NOTE — Telephone Encounter (Signed)
Gave avs and calendar for june °

## 2017-12-12 NOTE — Telephone Encounter (Signed)
Rx done and I left a detailed message at the pts cell number [facility]

## 2017-12-12 NOTE — Telephone Encounter (Signed)
Copied from CRM [id number]. Topic: General - Other >> Dec 12, 2017 11:40 AM Gerrianne ScalePayne, Angela L wrote: Reason for CRM: patient calling wanting Dr Caryl NeverBurchette to know that his son was Dx with the flu and that his sons doctor told him to let Dr Caryl NeverBurchette know so he would be treated patient has no symptoms

## 2018-01-23 ENCOUNTER — Ambulatory Visit: Payer: Self-pay

## 2018-01-23 NOTE — Telephone Encounter (Signed)
FYI for review. 

## 2018-01-23 NOTE — Telephone Encounter (Signed)
I spoke with pt and he is feeling better, no more symptoms at this time. He laid down after speaking with triage nurse this morning, he had worked last night and not sure if that brought on any of the symptoms. He agreed that if symptoms reappeared he will go to ER for treatment.

## 2018-01-23 NOTE — Telephone Encounter (Signed)
Pt calling with c/o  chest pain under his breastbone  that began Monday and is constant. Chest pain shoots to the back to the left shoulder area. Pt is having moderate pain, but pain is intermittently severe. In an attempt to decrease pain, he has been raising his left arm which offers some relief but it does not last. Pt advised to have another adult [facility]. Pt states understanding and [facility]. Instructed pt to be NPO.   Reason for Disposition . [1] Intermittent  chest pain or "angina" AND [2] increasing in severity or frequency  (Exception: pains lasting a few seconds)  Answer Assessment - Initial Assessment Questions 1. LOCATION: "Where does it hurt?"       Under breast bone 2. RADIATION: "Does the pain go anywhere else?" (e.g., into neck, jaw, arms, back)     Intermittently goes to the back 3. ONSET: "When did the chest pain begin?" (Minutes, hours or days)      Monday 4. PATTERN "Does the pain come and go, or has it been constant since it started?"  "Does it get worse with exertion?"      Constant- not worse with exertion 5. DURATION: "How long does it last" (e.g., seconds, minutes, hours)     Constant- 6. SEVERITY: "How bad is the pain?"  (e.g., Scale 1-10; mild, moderate, or severe)    - MILD (1-3): doesn't interfere with normal activities     - MODERATE (4-7): interferes with normal activities or awakens from sleep    - SEVERE (8-10): excruciating pain, unable to do any normal activities       Sometimes its an 8 but right now its a 5 7. CARDIAC RISK FACTORS: "Do you have any history of heart problems or risk factors for heart disease?" (e.g., prior heart attack, angina; high blood pressure, diabetes, being overweight, high cholesterol, smoking, or strong family history of heart disease)     High cholesterol 8. PULMONARY RISK FACTORS: "Do you have any history of lung disease?"  (e.g., blood clots in lung, asthma, emphysema, birth  control pills)     no 9. CAUSE: "What do you think is causing the chest pain?"     Pt does not know 10. OTHER SYMPTOMS: "Do you have any other symptoms?" (e.g., dizziness, nausea, vomiting, sweating, fever, difficulty breathing, cough)       no 11. PREGNANCY: "Is there any chance you are pregnant?" "When was your last menstrual period?"       n/a  Protocols used: CHEST PAIN-A-AH

## 2018-01-23 NOTE — Telephone Encounter (Signed)
Monitor for ER arrival 

## 2018-01-24 NOTE — Telephone Encounter (Signed)
Recommend prompt evaluation for any recurrent symptoms.

## 2018-04-22 ENCOUNTER — Telehealth: Payer: Self-pay | Admitting: Oncology

## 2018-04-22 NOTE — Telephone Encounter (Signed)
Appointment rescheduled and patient notified per 5/15 staff msg

## 2018-04-23 ENCOUNTER — Telehealth: Payer: Self-pay | Admitting: Pediatric Emergency Medicine

## 2018-04-23 ENCOUNTER — Inpatient Hospital Stay: Payer: BLUE CROSS/BLUE SHIELD | Attending: Oncology

## 2018-04-23 ENCOUNTER — Inpatient Hospital Stay (HOSPITAL_BASED_OUTPATIENT_CLINIC_OR_DEPARTMENT_OTHER): Payer: BLUE CROSS/BLUE SHIELD | Admitting: Oncology

## 2018-04-23 VITALS — BP 123/61 | HR 52 | Temp 98.6°F | Resp 14 | Ht 69.75 in | Wt 172.8 lb

## 2018-04-23 DIAGNOSIS — D696 Thrombocytopenia, unspecified: Secondary | ICD-10-CM | POA: Insufficient documentation

## 2018-04-23 LAB — CBC WITH DIFFERENTIAL/PLATELET
BASOS ABS: 0 10*3/uL (ref 0.0–0.1)
BASOS PCT: 1 %
Eosinophils Absolute: 0.1 10*3/uL (ref 0.0–0.5)
Eosinophils Relative: 2 %
HEMATOCRIT: 45.4 % (ref 38.4–49.9)
HEMOGLOBIN: 15.4 g/dL (ref 13.0–17.1)
Lymphocytes Relative: 46 %
Lymphs Abs: 2.1 10*3/uL (ref 0.9–3.3)
MCH: 28 pg (ref 27.2–33.4)
MCHC: 33.9 g/dL (ref 32.0–36.0)
MCV: 82.5 fL (ref 79.3–98.0)
Monocytes Absolute: 0.4 10*3/uL (ref 0.1–0.9)
Monocytes Relative: 9 %
NEUTROS ABS: 1.9 10*3/uL (ref 1.5–6.5)
NEUTROS PCT: 42 %
Platelets: 63 10*3/uL — ABNORMAL LOW (ref 140–400)
RBC: 5.5 MIL/uL (ref 4.20–5.82)
RDW: 14.2 % (ref 11.0–14.6)
WBC: 4.4 10*3/uL (ref 4.0–10.3)

## 2018-04-23 NOTE — Telephone Encounter (Signed)
Appointments scheduled AVS/Calendar printed per 5/16 los °

## 2018-04-23 NOTE — Progress Notes (Signed)
Hematology and Oncology Follow Up Visit  Joseph Lindsey [id number] [birth year] 47 y.o. 04/23/2018 1:03 PM  CC: [name], M.D.    Principle Diagnosis: 47 year old man with thrombocytopenia due to to autoimmune etiology diagnosed in 2011.   Current therapy: Active surveillance.  Interim History:   [name] is here for a follow-up visit.  Since the last visit, he reports no recent complaints or changes.  He remains active and attends to activities of daily living.  He is planning to travel for the next 3 months and wanted to get his lab checked prior.  He denies any hemoptysis, hematemesis or epistaxis.  He continues to have excellent exercise tolerance without any bone pain or pathological fractures.   He does not report any headaches, blurry vision, syncope or seizures.  He denies any fevers, chills or sweats.  He did not report any chest pain, palpitation or orthopnea.  He denies any cough, wheezing or hemoptysis.  He does not report any abdominal pain, constipation, diarrhea or distention. Did not report any frequency urgency or hesitancy. He reports no arthralgias or myalgias.  He denies any lymphadenopathy or petechiae.  He denies any skin rashes or lesions.  The remaining review of systems is negative.  Medications: I have reviewed the patient's current medications. Current Outpatient Medications  Medication Sig Dispense Refill  . fexofenadine (ALLEGRA) 180 MG tablet Take 180 mg by mouth daily.    Marland Kitchen levothyroxine (SYNTHROID, LEVOTHROID) 88 MCG tablet TAKE 1 TABLET DAILY (NEED TO SCHEDULE PHYSICAL EXAM) 90 tablet 2  . oseltamivir (TAMIFLU) 75 MG capsule Take 1 capsule (75 mg total) by mouth daily. 10 capsule 0  . triamcinolone cream (KENALOG) 0.1 % APPLY EXTERNALLY TO THE AFFECTED AREA TWICE A DAY 30 g 0   No current facility-administered medications for this visit.     Allergies: No Known Allergies  Past Medical History, Surgical history, Social history, and Family  History were reviewed and updated.   Physical Exam: Blood pressure 123/61, pulse (!) 52, temperature 98.6 F (37 C), temperature source Oral, resp. rate 14, height 5' 9.75" (1.772 m), weight 172 lb 12.8 oz (78.4 kg), SpO2 100 %.    ECOG: 0 General appearance: Well-appearing gentleman without distress. Head: Atraumatic without abnormalities. Eyes: No scleral icterus. Oropharynx: Without any thrush or ulcers. Lymph nodes: No cervical, supraclavicular, and axillary nodes. Heart: Regular rate without any murmurs or gallops. Lung: Clear to auscultation without any rhonchi, wheezes or dullness to percussion. Abdomin: Soft, nontender without any rebound or guarding. Musculoskeletal: No joint deformity or effusion. Skin: No rashes or lesions.  No petechia noted.    Lab Results: Lab Results  Component Value Date   WBC 4.9 12/12/2017   HGB 15.4 12/12/2017   HCT 45.8 12/12/2017   MCV 83.7 12/12/2017   PLT 59 (L) 12/12/2017     Chemistry      Component Value Date/Time   NA 137 07/28/2017 1053   NA 140 08/17/2015 1114   K 4.0 07/28/2017 1053   K 4.1 08/17/2015 1114   CL 101 07/28/2017 1053   CL 106 12/15/2012 0948   CO2 29 07/28/2017 1053   CO2 26 08/17/2015 1114   BUN 13 07/28/2017 1053   BUN 10.3 08/17/2015 1114   CREATININE 1.15 07/28/2017 1053   CREATININE 1.2 08/17/2015 1114      Component Value Date/Time   CALCIUM 9.6 07/28/2017 1053   CALCIUM 9.4 08/17/2015 1114   ALKPHOS 60 07/28/2017 1053   ALKPHOS 67 08/17/2015  1114   AST 20 07/28/2017 1053   AST 24 08/17/2015 1114   ALT 19 07/28/2017 1053   ALT 30 08/17/2015 1114   BILITOT 0.6 07/28/2017 1053   BILITOT 0.60 08/17/2015 1114      Impression and Plan: 47 year old man with the:  1. Thrombocytopenia: Diagnosed since 2011 with mild fluctuating platelet count close to 60,000.  Etiology is related to autoimmune versus reactive findings.  Congenital thrombocytopenia could also be a possibility.  His platelet count  today continues to be at his baseline without any active bleeding.  No intervention is needed at this time.  I have recommended continued active surveillance. 2. Dental procedures: He underwent dental surgery without any issues or bleeding. 3. Followup: 6 months   15  minutes was spent with the patient face-to-face today.  More than 50% of time was dedicated to patient counseling, education and coordination of his care.    Eli Hose, MD 5/16/20191:03 PM

## 2018-04-24 ENCOUNTER — Other Ambulatory Visit: Payer: Self-pay | Admitting: Family Medicine

## 2018-05-26 ENCOUNTER — Ambulatory Visit: Payer: BLUE CROSS/BLUE SHIELD | Admitting: Oncology

## 2018-05-26 ENCOUNTER — Other Ambulatory Visit: Payer: BLUE CROSS/BLUE SHIELD

## 2018-07-05 ENCOUNTER — Other Ambulatory Visit: Payer: Self-pay | Admitting: Family Medicine

## 2018-08-06 ENCOUNTER — Other Ambulatory Visit: Payer: Self-pay | Admitting: Family Medicine

## 2018-10-06 ENCOUNTER — Other Ambulatory Visit: Payer: Self-pay | Admitting: Family Medicine

## 2018-10-16 ENCOUNTER — Encounter: Payer: BLUE CROSS/BLUE SHIELD | Admitting: Family Medicine

## 2018-10-20 ENCOUNTER — Encounter: Payer: BLUE CROSS/BLUE SHIELD | Admitting: Family Medicine

## 2018-10-20 DIAGNOSIS — Z0289 Encounter for other administrative examinations: Secondary | ICD-10-CM

## 2018-10-22 ENCOUNTER — Telehealth: Payer: Self-pay | Admitting: Oncology

## 2018-10-22 ENCOUNTER — Inpatient Hospital Stay: Payer: BLUE CROSS/BLUE SHIELD | Attending: Oncology

## 2018-10-22 ENCOUNTER — Inpatient Hospital Stay (HOSPITAL_BASED_OUTPATIENT_CLINIC_OR_DEPARTMENT_OTHER): Payer: BLUE CROSS/BLUE SHIELD | Admitting: Oncology

## 2018-10-22 VITALS — BP 126/76 | HR 65 | Temp 98.1°F | Resp 17 | Ht 69.0 in | Wt 167.1 lb

## 2018-10-22 DIAGNOSIS — D696 Thrombocytopenia, unspecified: Secondary | ICD-10-CM | POA: Insufficient documentation

## 2018-10-22 LAB — CBC WITH DIFFERENTIAL (CANCER CENTER ONLY)
ABS IMMATURE GRANULOCYTES: 0 10*3/uL (ref 0.00–0.07)
Basophils Absolute: 0 10*3/uL (ref 0.0–0.1)
Basophils Relative: 0 %
Eosinophils Absolute: 0.1 10*3/uL (ref 0.0–0.5)
Eosinophils Relative: 3 %
HCT: 47.8 % (ref 39.0–52.0)
Hemoglobin: 15.3 g/dL (ref 13.0–17.0)
Immature Granulocytes: 0 %
LYMPHS PCT: 39 %
Lymphs Abs: 2 10*3/uL (ref 0.7–4.0)
MCH: 27.4 pg (ref 26.0–34.0)
MCHC: 32 g/dL (ref 30.0–36.0)
MCV: 85.5 fL (ref 80.0–100.0)
Monocytes Absolute: 0.5 10*3/uL (ref 0.1–1.0)
Monocytes Relative: 11 %
NEUTROS ABS: 2.4 10*3/uL (ref 1.7–7.7)
Neutrophils Relative %: 47 %
PLATELETS: 41 10*3/uL — AB (ref 150–400)
RBC: 5.59 MIL/uL (ref 4.22–5.81)
RDW: 14.2 % (ref 11.5–15.5)
WBC: 5.1 10*3/uL (ref 4.0–10.5)
nRBC: 0 % (ref 0.0–0.2)

## 2018-10-22 NOTE — Progress Notes (Signed)
Hematology and Oncology Follow Up Visit  Joseph Lindsey [id number] 06-12-1971 47 y.o. 10/22/2018 9:57 AM  CC: Joseph Lindsey, M.D.    Principle Diagnosis: 46 year old man with thrombocytopenia diagnosed in 2011.  The differential diagnosis includes ITP versus hereditary platelet syndrome.    Current therapy: Active surveillance.  Interim History:   Joseph Lindsey returns today for a repeat evaluation.  Since the last visit, he reports no major changes or complaints.  He denies any bleeding complaints including no hematochezia, melena or epistaxis.  His performance status and quality of life remains excellent.  He continues to have dental cleaning without any bleeding after that.  He had not had any dental procedures however.   He does not report any headaches, blurry vision, syncope or seizures.  He denies any confusion or lethargy.  He denies any fevers, chills or sweats.  He did not report any chest pain, palpitation or orthopnea.  He denies any cough, wheezing or hemoptysis.  He does not report any nausea, vomiting or distention.  He denies any changes in bowel habits.  Did not report any frequency urgency or hesitancy. He reports no bone pain or pathological fractures.  He denies any lymphadenopathy or petechiae.  He denies any ecchymosis or petechiae.  The remaining review of systems is negative.  Medications: I have reviewed the patient's current medications. Current Outpatient Medications  Medication Sig Dispense Refill  . fexofenadine (ALLEGRA) 180 MG tablet Take 180 mg by mouth daily.    Marland Kitchen levothyroxine (SYNTHROID, LEVOTHROID) 88 MCG tablet TAKE 1 TABLET DAILY (NEED TO SCHEDULE PHYSICAL EXAM) 90 tablet 0  . oseltamivir (TAMIFLU) 75 MG capsule Take 1 capsule (75 mg total) by mouth daily. 10 capsule 0  . triamcinolone cream (KENALOG) 0.1 % APPLY EXTERNALLY TO THE AFFECTED AREA TWICE A DAY 30 g 0   No current facility-administered medications for this visit.     Allergies: No  Known Allergies  Past Medical History, Surgical history, Social history, and Family History were reviewed and updated.   Physical Exam:  Blood pressure 126/76, pulse 65, temperature 98.1 F (36.7 C), temperature source Oral, resp. rate 17, height 5\' 9"  (1.753 m), weight 167 lb 1.6 oz (75.8 kg), SpO2 100 %.    ECOG: 0   General appearance: Comfortable appearing without any discomfort Head: Normocephalic without any trauma Oropharynx: Mucous membranes are moist and pink without any thrush or ulcers. Eyes: Pupils are equal and round reactive to light. Lymph nodes: No cervical, supraclavicular, inguinal or axillary lymphadenopathy.   Heart:regular rate and rhythm.  S1 and S2 without leg edema. Lung: Clear without any rhonchi or wheezes.  No dullness to percussion. Abdomin: Soft, nontender, nondistended with good bowel sounds.  No hepatosplenomegaly. Musculoskeletal: No joint deformity or effusion.  Full range of motion noted. Neurological: No deficits noted on motor, sensory and deep tendon reflex exam. Skin: No petechial rash or dryness.  Appeared moist.      Lab Results: Lab Results  Component Value Date   WBC 4.4 04/23/2018   HGB 15.4 04/23/2018   HCT 45.4 04/23/2018   MCV 82.5 04/23/2018   PLT 63 (L) 04/23/2018     Chemistry      Component Value Date/Time   NA 137 07/28/2017 1053   NA 140 08/17/2015 1114   K 4.0 07/28/2017 1053   K 4.1 08/17/2015 1114   CL 101 07/28/2017 1053   CL 106 12/15/2012 0948   CO2 29 07/28/2017 1053   CO2 26 08/17/2015 1114  BUN 13 07/28/2017 1053   BUN 10.3 08/17/2015 1114   CREATININE 1.15 07/28/2017 1053   CREATININE 1.2 08/17/2015 1114      Component Value Date/Time   CALCIUM 9.6 07/28/2017 1053   CALCIUM 9.4 08/17/2015 1114   ALKPHOS 60 07/28/2017 1053   ALKPHOS 67 08/17/2015 1114   AST 20 07/28/2017 1053   AST 24 08/17/2015 1114   ALT 19 07/28/2017 1053   ALT 30 08/17/2015 1114   BILITOT 0.6 07/28/2017 1053   BILITOT  0.60 08/17/2015 1114      Impression and plan:  47 year old man with:  1.  Thrombocytopenia: The differential diagnosis includes immune thrombocytopenia versus congenital platelet problems.  His platelet count has ranged close to the 60,000 range without any active bleeding issues.  His laboratory data as well as the natural course of his disease was reviewed again as well as future treatment options.  I recommended continued active surveillance at this time unless active bleeding is noted with a platelet count of less than 30,000.  Trial of steroids or IVIG may be needed if he develops any bleeding or worsening thrombocytopenia.  He is a CBC was reviewed today and his platelet count continues to be adequate around 40,000 without any active bleeding.  I recommended continued observation.  I given clear instructions to report to me any signs or symptoms of bleeding.  He has to have any dental procedure and will likely attempt to boost his platelet count close to 50,000 at that time.  2.  Follow-up: We will be in 6 months.  15  minutes was spent with the patient face-to-face today.  More than 50% of time was dedicated to reviewing laboratory data, differential diagnosis and answering questions regarding future plan of care.    Eli HoseFiras Suhayb Anzalone, MD 11/14/20199:57 AM

## 2018-10-22 NOTE — Telephone Encounter (Signed)
Gave pt avs and calendar  °

## 2018-10-26 ENCOUNTER — Ambulatory Visit (INDEPENDENT_AMBULATORY_CARE_PROVIDER_SITE_OTHER): Payer: BLUE CROSS/BLUE SHIELD | Admitting: Family Medicine

## 2018-10-26 ENCOUNTER — Other Ambulatory Visit: Payer: Self-pay

## 2018-10-26 ENCOUNTER — Encounter: Payer: Self-pay | Admitting: Family Medicine

## 2018-10-26 ENCOUNTER — Telehealth: Payer: Self-pay

## 2018-10-26 VITALS — BP 118/76 | HR 60 | Temp 97.7°F | Ht 68.0 in | Wt 163.7 lb

## 2018-10-26 DIAGNOSIS — Z0000 Encounter for general adult medical examination without abnormal findings: Secondary | ICD-10-CM | POA: Diagnosis not present

## 2018-10-26 LAB — PSA: PSA: 0.32 ng/mL (ref 0.10–4.00)

## 2018-10-26 LAB — BASIC METABOLIC PANEL
BUN: 13 mg/dL (ref 6–23)
CHLORIDE: 103 meq/L (ref 96–112)
CO2: 29 mEq/L (ref 19–32)
CREATININE: 1.21 mg/dL (ref 0.40–1.50)
Calcium: 9.8 mg/dL (ref 8.4–10.5)
GFR: 82.82 mL/min (ref >60.00)
GLUCOSE: 74 mg/dL (ref 70–99)
Potassium: 3.9 mEq/L (ref 3.5–5.1)
Sodium: 140 mEq/L (ref 135–145)

## 2018-10-26 LAB — HEPATIC FUNCTION PANEL
ALT: 23 U/L (ref 0–53)
AST: 26 U/L (ref 0–37)
Albumin: 4.4 g/dL (ref 3.5–5.2)
Alkaline Phosphatase: 59 U/L (ref 39–117)
BILIRUBIN TOTAL: 0.7 mg/dL (ref 0.2–1.2)
Bilirubin, Direct: 0.1 mg/dL (ref 0.0–0.3)
Total Protein: 7 g/dL (ref 6.0–8.3)

## 2018-10-26 LAB — LIPID PANEL
Cholesterol: 240 mg/dL — ABNORMAL HIGH (ref 0–200)
HDL: 60.2 mg/dL
LDL Cholesterol: 164 mg/dL — ABNORMAL HIGH (ref 0–99)
NonHDL: 179.4
Total CHOL/HDL Ratio: 4
Triglycerides: 77 mg/dL (ref 0.0–149.0)
VLDL: 15.4 mg/dL (ref 0.0–40.0)

## 2018-10-26 LAB — TSH: TSH: 9.05 u[IU]/mL — ABNORMAL HIGH (ref 0.35–4.50)

## 2018-10-26 NOTE — Progress Notes (Signed)
Subjective:     Patient ID: Joseph Lindsey, male   DOB: September 09, 1971, 47 y.o.   MRN: [id number]  HPI Patient is seen for complete physical.  He has history of chronic thrombocytopenia followed by hematology.  He has hypothyroidism treated with levothyroxine.  He is finishing up grad school and hopes to finish in December.  He said some recent issues with mild depressed mood.  Has had some loss of interest in activities and low motivation.  Some general fatigue.  Feels he is sleeping a little more than usual.  Denies any past history of depression.  No suicidal ideation.  Recent CBC per hematology reviewed.  Platelets were stable.  Family history significant for father with type 2 diabetes.  He apparently has couple sisters also with diabetes.  Grandfather with prostate cancer.  No known family history of colon cancer.  Past Medical History:  Diagnosis Date  . Fissure, anal    2003  . Migraines   . Primary thrombocytopenia, unspecified   . Thyroid disease    hypothyroid   Past Surgical History:  Procedure Laterality Date  . BREATH TEK H PYLORI N/A 11/11/2013   Procedure: BREATH TEK H PYLORI;  Surgeon: Beverley FiedlerJay M Pyrtle, MD;  [facility];  Service: Gastroenterology;  Laterality: N/A;    reports that he has never smoked. He has never used smokeless tobacco. He reports that he drinks alcohol. He reports that he does not use drugs. family history includes Asthma in his brother, father, and sister; Cancer (age of onset: 5044) in his brother; Diabetes in his father and sister; Hypertension in his father; Sickle cell trait in his unknown relative; Stomach cancer in his brother. No Known Allergies   Review of Systems  Constitutional: Positive for fatigue. Negative for activity change, appetite change, fever and unexpected weight change.  HENT: Negative for congestion, ear pain and trouble swallowing.   Eyes: Negative for pain and visual disturbance.  Respiratory: Negative for cough,  shortness of breath and wheezing.   Cardiovascular: Negative for chest pain and palpitations.  Gastrointestinal: Negative for abdominal distention, abdominal pain, blood in stool, constipation, diarrhea, nausea, rectal pain and vomiting.  Genitourinary: Negative for dysuria, hematuria and testicular pain.  Musculoskeletal: Negative for arthralgias and joint swelling.  Skin: Negative for rash.  Neurological: Negative for dizziness, syncope and headaches.  Hematological: Negative for adenopathy.  Psychiatric/Behavioral: Positive for dysphoric mood. Negative for agitation, confusion and suicidal ideas.       Objective:   Physical Exam  Constitutional: He is oriented to person, place, and time. He appears well-developed and well-nourished. No distress.  HENT:  Head: Normocephalic and atraumatic.  Right Ear: External ear normal.  Left Ear: External ear normal.  Mouth/Throat: Oropharynx is clear and moist.  Eyes: Pupils are equal, round, and reactive to light. Conjunctivae and EOM are normal.  Neck: Normal range of motion. Neck supple. No thyromegaly present.  Cardiovascular: Normal rate, regular rhythm and normal heart sounds.  No murmur heard. Pulmonary/Chest: No respiratory distress. He has no wheezes. He has no rales.  Abdominal: Soft. Bowel sounds are normal. He exhibits no distension and no mass. There is no tenderness. There is no rebound and no guarding.  Musculoskeletal: He exhibits no edema.  Lymphadenopathy:    He has no cervical adenopathy.  Neurological: He is alert and oriented to person, place, and time. He displays normal reflexes. No cranial nerve deficit.  Skin: No rash noted.  Psychiatric: He has a normal mood and affect.  PHQ 9 of 14       Assessment:     Physical exam.  Patient has had some recent increased depressed mood.  He attributes some of this to increased stress from finishing up grad school.    Plan:     -We discussed possible medication options for  depression but he declines at this point.  He does agree to consider counseling and was given names and numbers to set that up. -Also advised regular exercise -Obtain lab work.  We will not check CBC since this is just checked a few days ago per hematology -Patient had questions regarding colon cancer screening prior to age 84.  He will check on insurance coverage  Kristian Covey MD [facility] at [facility]

## 2018-10-26 NOTE — Patient Instructions (Addendum)
Check with insurance if interested to see if they will cover colon cancer screening with colonoscopy  Consider setting with counseling with Maggie FontKeith Cottle.

## 2018-10-26 NOTE — Telephone Encounter (Signed)
Called patient to make sure he will be able to come to his appointment today at 11:20am.   OK for PEC to discuss/schedule.  CRM Created in case patient calls back.

## 2018-10-29 ENCOUNTER — Other Ambulatory Visit: Payer: Self-pay | Admitting: Family Medicine

## 2018-10-29 NOTE — Telephone Encounter (Signed)
Requested medication (s) are due for refill today:   Requested medication (s) are on the active medication list:     Last refill:   Future visit scheduled   Notes to clinic Per  OV 11/18 dose was to be increased to 100mcg; not ordered as of yet  Requested Prescriptions  Pending Prescriptions Disp Refills   levothyroxine (SYNTHROID, LEVOTHROID) 88 MCG tablet 90 tablet 0     Endocrinology:  Hypothyroid Agents Failed - 10/29/2018 11:54 AM      Failed - TSH needs to be rechecked within 3 months after an abnormal result. Refill until TSH is due.      Failed - TSH in normal range and within 360 days    TSH  Date Value Ref Range Status  10/26/2018 9.05 (H) 0.35 - 4.50 uIU/mL Final         Passed - Valid encounter within last 12 months    Recent Outpatient Visits          3 days ago [facility] Elberta FortisBruce W, MD   11 months ago Cough   [facility] Elberta FortisBruce W, MD   1 year ago Physical exam   [facility] Elberta FortisBruce W, MD   2 years ago Physical exam   [facility] Elberta FortisBruce W, MD   2 years ago Upper back pain on left side   [facility] Elberta FortisBruce W, MD

## 2018-10-29 NOTE — Telephone Encounter (Signed)
Copied from CRM [id number]. Topic: Quick Communication - Rx Refill/Question >> Oct 29, 2018 10:12 AM Baldo DaubAlexander, Amber L wrote: Medication: levothyroxine (SYNTHROID, LEVOTHROID) 88 MCG tablet  Has the patient contacted their pharmacy? Yes - states it was supposed to be called in after his OV, but the pharmacy doesn't have it (Agent: If no, request that the patient contact the pharmacy for the refill.) (Agent: If yes, when and what did the pharmacy advise?)  Preferred Pharmacy (with phone number or street name): [id number] [place]- [place] - [address] AT [facility] [address] [phone] (Phone) [phone] (Fax)  Agent: Please be advised that RX refills may take up to 3 business days. We ask that you follow-up with your pharmacy.

## 2018-10-30 MED ORDER — LEVOTHYROXINE SODIUM 100 MCG PO TABS: 100.0000 ug | ORAL_TABLET | Freq: Every day | ORAL | 1 refills | Status: DC

## 2018-10-30 NOTE — Addendum Note (Signed)
Addended by: Kern ReapVEREEN, Liahna Brickner B on: 10/30/2018 02:45 PM   Modules accepted: Orders

## 2018-12-28 ENCOUNTER — Ambulatory Visit (INDEPENDENT_AMBULATORY_CARE_PROVIDER_SITE_OTHER): Payer: BLUE CROSS/BLUE SHIELD | Admitting: Family Medicine

## 2018-12-28 ENCOUNTER — Encounter: Payer: Self-pay | Admitting: Family Medicine

## 2018-12-28 ENCOUNTER — Other Ambulatory Visit: Payer: Self-pay

## 2018-12-28 VITALS — BP 108/64 | HR 56 | Temp 98.0°F | Ht 68.0 in | Wt 169.3 lb

## 2018-12-28 DIAGNOSIS — E039 Hypothyroidism, unspecified: Secondary | ICD-10-CM | POA: Diagnosis not present

## 2018-12-28 DIAGNOSIS — R358 Other polyuria: Secondary | ICD-10-CM

## 2018-12-28 DIAGNOSIS — R3589 Other polyuria: Secondary | ICD-10-CM

## 2018-12-28 LAB — T4, FREE: FREE T4: 1.11 ng/dL (ref 0.60–1.60)

## 2018-12-28 LAB — HEMOGLOBIN A1C: Hgb A1c MFr Bld: 5.5 % (ref 4.6–6.5)

## 2018-12-28 LAB — T3, FREE: T3 FREE: 3.4 pg/mL (ref 2.3–4.2)

## 2018-12-28 LAB — TSH: TSH: 3.35 u[IU]/mL (ref 0.35–4.50)

## 2018-12-28 NOTE — Progress Notes (Signed)
  Subjective:     Patient ID: Joseph Lindsey, male   DOB: Jun 26, 1971, 48 y.o.   MRN: [id number]  HPI Patient is here for follow-up TSH.  He had labs done a few months ago with TSH 9.05.  We increased his levothyroxine to 100 mcg daily.  He states he is compliant with taking this and does not take this with any other medications.  He has had some fatigue issues.  He also complains of some increased urine frequency.  He is concerned because of very strong family history of type 2 diabetes in his father and 3 sisters.  He has not had any polydipsia or weight loss.  His fasting sugars in the past have been consistently normal here.  Denies any burning with urination.  No penile discharge.  Past Medical History:  Diagnosis Date  . Fissure, anal    2003  . Migraines   . Primary thrombocytopenia, unspecified   . Thyroid disease    hypothyroid   Past Surgical History:  Procedure Laterality Date  . BREATH TEK H PYLORI N/A 11/11/2013   Procedure: BREATH TEK H PYLORI;  Surgeon: Beverley Fiedler, MD;  Location[facility];  Service: Gastroenterology;  Laterality: N/A;    reports that he has never smoked. He has never used smokeless tobacco. He reports current alcohol use. He reports that he does not use drugs. family history includes Asthma in his brother, father, and sister; Cancer (age of onset: 34) in his brother; Diabetes in his father and sister; Hypertension in his father; Sickle cell trait in an other family member; Stomach cancer in his brother. No Known Allergies\   Review of Systems  Constitutional: Positive for fatigue. Negative for appetite change and unexpected weight change.  Endocrine: Positive for polyuria. Negative for cold intolerance, heat intolerance and polydipsia.  Genitourinary: Negative for dysuria, flank pain and hematuria.       Objective:   Physical Exam Constitutional:      Appearance: Normal appearance.  Cardiovascular:     Rate and Rhythm: Normal rate and  regular rhythm.  Pulmonary:     Effort: Pulmonary effort is normal.     Breath sounds: Normal breath sounds.  Musculoskeletal:        General: No swelling.  Neurological:     Mental Status: He is alert.        Assessment:     #1 hypothyroidism recently under replaced by labs  #2 polyuria.  Patient is concerned about diabetes even though his fasting sugars have been normal in the past.  Very strong family history as above    Plan:     -Recheck TSH along with free T4 free T3. -Check hemoglobin A1c baseline  Joseph Covey MD [facility] at [facility]

## 2018-12-28 NOTE — Patient Instructions (Signed)
Hypothyroidism  Hypothyroidism is when the thyroid gland does not make enough of certain hormones (it is underactive). The thyroid gland is a small gland located in the lower front part of the neck, just in front of the windpipe (trachea). This gland makes hormones that help control how the body uses food for energy (metabolism) as well as how the heart and brain function. These hormones also play a role in keeping your bones strong. When the thyroid is underactive, it produces too little of the hormones thyroxine (T4) and triiodothyronine (T3). What are the causes? This condition may be caused by:  Hashimoto's disease. This is a disease in which the body's disease-fighting system (immune system) attacks the thyroid gland. This is the most common cause.  Viral infections.  Pregnancy.  Certain medicines.  Birth defects.  Past radiation treatments to the head or neck for cancer.  Past treatment with radioactive iodine.  Past exposure to radiation in the environment.  Past surgical removal of part or all of the thyroid.  Problems with a gland in the center of the brain (pituitary gland).  Lack of enough iodine in the diet. What increases the risk? You are more likely to develop this condition if:  You are male.  You have a family history of thyroid conditions.  You use a medicine called lithium.  You take medicines that affect the immune system (immunosuppressants). What are the signs or symptoms? Symptoms of this condition include:  Feeling as though you have no energy (lethargy).  Not being able to tolerate cold.  Weight gain that is not explained by a change in diet or exercise habits.  Lack of appetite.  Dry skin.  Coarse hair.  Menstrual irregularity.  Slowing of thought processes.  Constipation.  Sadness or depression. How is this diagnosed? This condition may be diagnosed based on:  Your symptoms, your medical history, and a physical exam.  Blood  tests. You may also have imaging tests, such as an ultrasound or MRI. How is this treated? This condition is treated with medicine that replaces the thyroid hormones that your body does not make. After you begin treatment, it may take several weeks for symptoms to go away. Follow these instructions at home:  Take over-the-counter and prescription medicines only as told by your health care provider.  If you start taking any new medicines, tell your health care provider.  Keep all follow-up visits as told by your health care provider. This is important. ? As your condition improves, your dosage of thyroid hormone medicine may change. ? You will need to have blood tests regularly so that your health care provider can monitor your condition. Contact a health care provider if:  Your symptoms do not get better with treatment.  You are taking thyroid replacement medicine and you: ? Sweat a lot. ? Have tremors. ? Feel anxious. ? Lose weight rapidly. ? Cannot tolerate heat. ? Have emotional swings. ? Have diarrhea. ? Feel weak. Get help right away if you have:  Chest pain.  An irregular heartbeat.  A rapid heartbeat.  Difficulty breathing. Summary  Hypothyroidism is when the thyroid gland does not make enough of certain hormones (it is underactive).  When the thyroid is underactive, it produces too little of the hormones thyroxine (T4) and triiodothyronine (T3).  The most common cause is Hashimoto's disease, a disease in which the body's disease-fighting system (immune system) attacks the thyroid gland. The condition can also be caused by viral infections, medicine, pregnancy, or past   radiation treatment to the head or neck.  Symptoms may include weight gain, dry skin, constipation, feeling as though you do not have energy, and not being able to tolerate cold.  This condition is treated with medicine to replace the thyroid hormones that your body does not make. This information  is not intended to replace advice given to you by your health care provider. Make sure you discuss any questions you have with your health care provider. Document Released: 11/25/2005 Document Revised: 11/05/2017 Document Reviewed: 11/05/2017 Elsevier Interactive Patient Education  2019 Elsevier Inc.  

## 2019-02-16 ENCOUNTER — Other Ambulatory Visit: Payer: Self-pay | Admitting: Family Medicine

## 2019-02-16 ENCOUNTER — Encounter: Payer: Self-pay | Admitting: Family Medicine

## 2019-02-17 ENCOUNTER — Encounter: Payer: Self-pay | Admitting: Family Medicine

## 2019-02-17 ENCOUNTER — Ambulatory Visit (INDEPENDENT_AMBULATORY_CARE_PROVIDER_SITE_OTHER): Payer: BLUE CROSS/BLUE SHIELD | Admitting: Family Medicine

## 2019-02-17 ENCOUNTER — Other Ambulatory Visit: Payer: Self-pay

## 2019-02-17 VITALS — BP 122/74 | HR 57 | Temp 97.9°F | Ht 68.0 in | Wt 169.5 lb

## 2019-02-17 DIAGNOSIS — R195 Other fecal abnormalities: Secondary | ICD-10-CM | POA: Diagnosis not present

## 2019-02-17 NOTE — Patient Instructions (Signed)
Focus on plenty of fluids and fiber  Follow up for any bloody stools, weight loss, or appetite changes.    Aim for at least 25 grams fiber per day.

## 2019-02-17 NOTE — Progress Notes (Signed)
  Subjective:     Patient ID: Joseph Lindsey, male   DOB: Jan 27, 1971, 48 y.o.   MRN: [id number]  HPI Patient seen with recent concern for some stains in his underwear.  Apparently couple times he had concerns that he had some issues with staining on the backside.  No concern for penile discharge.  He denies any blood in his stools.  He had recently had some constipation but started increasing fiber intake and has had none recently.  No diarrhea.  No pain with bowel movements.  No bloody stools.  He had colonoscopy 2014 which was normal.  His major concern was he states that a couple times he noticed what looked like "oily" substance floating on the water in the toilet bowl and this was after bowel movement.  He has never noted any increased buoyancy of his stools himself.  He has not had any diarrhea, foul-smelling stools, weight loss, or any other signs or symptoms of malabsorption.  Weight is actually up about 6 pounds from when he was here back late last year  Past Medical History:  Diagnosis Date  . Fissure, anal    2003  . Migraines   . Primary thrombocytopenia, unspecified   . Thyroid disease    hypothyroid   Past Surgical History:  Procedure Laterality Date  . BREATH TEK H PYLORI N/A 11/11/2013   Procedure: BREATH TEK H PYLORI;  Surgeon: Beverley Fiedler, MD;  Location[facility];  Service: Gastroenterology;  Laterality: N/A;    reports that he has never smoked. He has never used smokeless tobacco. He reports current alcohol use. He reports that he does not use drugs. family history includes Asthma in his brother, father, and sister; Cancer (age of onset: 5) in his brother; Diabetes in his father and sister; Hypertension in his father; Sickle cell trait in an other family member; Stomach cancer in his brother. No Known Allergies   Review of Systems  Constitutional: Negative for appetite change and unexpected weight change.  Gastrointestinal: Negative for abdominal distention,  abdominal pain, blood in stool, constipation, diarrhea, nausea, rectal pain and vomiting.       Objective:   Physical Exam Constitutional:      Appearance: Normal appearance.  Cardiovascular:     Rate and Rhythm: Normal rate and regular rhythm.  Genitourinary:    Comments: Rectal exam reveals no external hemorrhoids.  No visible anal fissure.  Digital exam no rectal mass. Neurological:     Mental Status: He is alert.        Assessment:     Patient complains of oily appearing substance floating on surface of toilet water after some recent bowel movements.  He does not describe any stools that are floating.  He has not had any evidence to suggest malabsorption syndrome.  Etiology unclear    Plan:     -Patient will observe at this point and be in touch if he sees any further episodes. -Discussed measures to reduce constipation with adequate fiber  Kristian Covey MD [facility]

## 2019-03-30 ENCOUNTER — Telehealth: Payer: Self-pay | Admitting: *Deleted

## 2019-03-30 NOTE — Telephone Encounter (Signed)
Per 4/20 los moved apptsa from 5/14 to 8/14. Patient called and give the new appt date/times.

## 2019-04-19 ENCOUNTER — Telehealth: Payer: Self-pay | Admitting: Family Medicine

## 2019-04-19 ENCOUNTER — Other Ambulatory Visit: Payer: Self-pay

## 2019-04-19 MED ORDER — LEVOTHYROXINE SODIUM 100 MCG PO TABS: ORAL_TABLET | ORAL | 1 refills | Status: DC

## 2019-04-19 NOTE — Telephone Encounter (Signed)
Prescription has been sent to [facility] for patient.

## 2019-04-19 NOTE — Telephone Encounter (Signed)
Copied from CRM [id number]. Topic: Quick Communication - Rx Refill/Question >> Apr 19, 2019  2:06 PM Reggie Pile, NT wrote: Medication:  levothyroxine (SYNTHROID, LEVOTHROID) 100 MCG tablet  Has the patient contacted their pharmacy? Yes patient states the pharmacy keeps denying it. Patient is calling in a 90day supply.   Preferred Pharmacy (with phone number or street name):  [facility] - [place], [place] - [address] [phone] (Phone) [phone] (Fax)  Agent: Please be advised that RX refills may take up to 3 business days. We ask that you follow-up with your pharmacy.

## 2019-04-22 ENCOUNTER — Other Ambulatory Visit: Payer: BLUE CROSS/BLUE SHIELD

## 2019-04-22 ENCOUNTER — Ambulatory Visit: Payer: BLUE CROSS/BLUE SHIELD | Admitting: Oncology

## 2019-07-23 ENCOUNTER — Inpatient Hospital Stay: Payer: BC Managed Care – PPO | Attending: Oncology

## 2019-07-23 ENCOUNTER — Inpatient Hospital Stay (HOSPITAL_BASED_OUTPATIENT_CLINIC_OR_DEPARTMENT_OTHER): Payer: BC Managed Care – PPO | Admitting: Oncology

## 2019-07-23 ENCOUNTER — Telehealth: Payer: Self-pay | Admitting: Oncology

## 2019-07-23 ENCOUNTER — Other Ambulatory Visit: Payer: Self-pay

## 2019-07-23 VITALS — BP 112/75 | HR 57 | Temp 98.0°F | Ht 68.0 in | Wt 170.3 lb

## 2019-07-23 DIAGNOSIS — D696 Thrombocytopenia, unspecified: Secondary | ICD-10-CM

## 2019-07-23 DIAGNOSIS — Z79899 Other long term (current) drug therapy: Secondary | ICD-10-CM | POA: Insufficient documentation

## 2019-07-23 DIAGNOSIS — D693 Immune thrombocytopenic purpura: Secondary | ICD-10-CM | POA: Insufficient documentation

## 2019-07-23 LAB — CBC WITH DIFFERENTIAL (CANCER CENTER ONLY)
Abs Immature Granulocytes: 0.02 10*3/uL (ref 0.00–0.07)
Basophils Absolute: 0 10*3/uL (ref 0.0–0.1)
Basophils Relative: 1 %
Eosinophils Absolute: 0.1 10*3/uL (ref 0.0–0.5)
Eosinophils Relative: 3 %
HCT: 50.8 % (ref 39.0–52.0)
Hemoglobin: 16.6 g/dL (ref 13.0–17.0)
Immature Granulocytes: 1 %
Lymphocytes Relative: 42 %
Lymphs Abs: 1.8 10*3/uL (ref 0.7–4.0)
MCH: 27.6 pg (ref 26.0–34.0)
MCHC: 32.7 g/dL (ref 30.0–36.0)
MCV: 84.4 fL (ref 80.0–100.0)
Monocytes Absolute: 0.5 10*3/uL (ref 0.1–1.0)
Monocytes Relative: 11 %
Neutro Abs: 1.9 10*3/uL (ref 1.7–7.7)
Neutrophils Relative %: 42 %
Platelet Count: 66 10*3/uL — ABNORMAL LOW (ref 150–400)
RBC: 6.02 MIL/uL — ABNORMAL HIGH (ref 4.22–5.81)
RDW: 14.1 % (ref 11.5–15.5)
WBC Count: 4.4 10*3/uL (ref 4.0–10.5)
nRBC: 0 % (ref 0.0–0.2)

## 2019-07-23 NOTE — Progress Notes (Signed)
Hematology and Oncology Follow Up Visit  BRADY SCHILLER [id number] March 09, 1971 48 y.o. 07/23/2019 11:46 AM  CC: Carolann Littler, M.D.    Principle Diagnosis: 48 year old man with ITP diagnosed in 2011.  He had fluctuating a platelet count between 40 and 60,000 since that time.  He has not required any treatment.    Current therapy: Active surveillance.  Interim History:   Mr. Crist is here for return evaluation.  Since the last visit, he reports no major changes in his health.  He denies any active bleeding, bruising or any hospitalizations.  He remains active and has not reported any decline in ability to work.  Denies any changes in his bowel habits or respiratory complaints.  Patient denied any alteration mental status, neuropathy, confusion or dizziness.  Denies any headaches or lethargy.  Denies any night sweats, weight loss or changes in appetite.  Denied orthopnea, dyspnea on exertion or chest discomfort.  Denies shortness of breath, difficulty breathing hemoptysis or cough.  Denies any abdominal distention, nausea, early satiety or dyspepsia.  Denies any hematuria, frequency, dysuria or nocturia.  Denies any skin irritation, dryness or rash.  Denies any ecchymosis or petechiae.  Denies any lymphadenopathy or clotting.  Denies any heat or cold intolerance.  Denies any anxiety or depression.  Remaining review of system is negative.     Medications: Updated and reviewed. Current Outpatient Medications  Medication Sig Dispense Refill  . fexofenadine (ALLEGRA) 180 MG tablet Take 180 mg by mouth daily.    Marland Kitchen levothyroxine (SYNTHROID) 100 MCG tablet TAKE 1 TABLET(100 MCG) BY MOUTH DAILY 90 tablet 1  . triamcinolone cream (KENALOG) 0.1 % Apply topically 2 (two) times daily as needed. 30 g 2   No current facility-administered medications for this visit.     Allergies: No Known Allergies  Past Medical History, Surgical history, Social history, and Family History without any change on  review.   Physical Exam:  Blood pressure 112/75, pulse (!) 57, temperature 98 F (36.7 C), temperature source Temporal, height 5\' 8"  (1.727 m), weight 170 lb 4.8 oz (77.2 kg), SpO2 100 %.     ECOG: 0     General appearance: Alert, awake without any distress. Head: Atraumatic without abnormalities Oropharynx: Without any thrush or ulcers. Eyes: No scleral icterus. Lymph nodes: No lymphadenopathy noted in the cervical, supraclavicular, or axillary nodes Heart:regular rate and rhythm, without any murmurs or gallops.   Lung: Clear to auscultation without any rhonchi, wheezes or dullness to percussion. Abdomin: Soft, nontender without any shifting dullness or ascites. Musculoskeletal: No clubbing or cyanosis. Neurological: No motor or sensory deficits. Skin: No rashes or lesions. Psychiatric: Mood and affect appeared normal.     Lab Results: Lab Results  Component Value Date   WBC 5.1 10/22/2018   HGB 15.3 10/22/2018   HCT 47.8 10/22/2018   MCV 85.5 10/22/2018   PLT 41 (L) 10/22/2018     Chemistry      Component Value Date/Time   NA 140 10/26/2018 1209   NA 140 08/17/2015 1114   K 3.9 10/26/2018 1209   K 4.1 08/17/2015 1114   CL 103 10/26/2018 1209   CL 106 12/15/2012 0948   CO2 29 10/26/2018 1209   CO2 26 08/17/2015 1114   BUN 13 10/26/2018 1209   BUN 10.3 08/17/2015 1114   CREATININE 1.21 10/26/2018 1209   CREATININE 1.2 08/17/2015 1114      Component Value Date/Time   CALCIUM 9.8 10/26/2018 1209   CALCIUM 9.4 08/17/2015  1114   ALKPHOS 59 10/26/2018 1209   ALKPHOS 67 08/17/2015 1114   AST 26 10/26/2018 1209   AST 24 08/17/2015 1114   ALT 23 10/26/2018 1209   ALT 30 08/17/2015 1114   BILITOT 0.7 10/26/2018 1209   BILITOT 0.60 08/17/2015 1114        Impression and plan:  48 year old man with:  1.  Thrombocytopenia diagnosed in 2011.  He continues to have fluctuating platelet count between 40 and 60,000 with the etiology related to ITP versus  congenital causes.  The differential diagnosis was reviewed again and management options were reiterated.  He develops persistent platelet count below 30,000 or he has active bleeding a trial of prednisone would be the next step in attempt to improve his platelet count.  He is considering dental implants and prior to the procedure we will check his counts to make sure they are adequate's prior to proceeding with that.  He is CBC was reviewed today and showed a platelet count of 66,000 which should be more than adequate to undergo any dental procedure.   2.  Follow-up: Will be in 6 months for repeat evaluation.  15  minutes was spent with the patient face-to-face today.  More than 50% of time was dedicated to reviewing laboratory data, differential diagnosis and answering questions regarding future plan of care.    Eli HoseFiras Demarie Uhlig, MD 8/14/202011:46 AM

## 2019-07-23 NOTE — Telephone Encounter (Signed)
Scheduled appt per 8/14 sch message.  Spoke with patient and he is aware of his appt date and time.

## 2019-09-28 ENCOUNTER — Other Ambulatory Visit: Payer: Self-pay | Admitting: Family Medicine

## 2019-11-16 ENCOUNTER — Encounter: Payer: Self-pay | Admitting: Family Medicine

## 2019-11-16 ENCOUNTER — Other Ambulatory Visit: Payer: Self-pay

## 2019-11-16 ENCOUNTER — Ambulatory Visit (INDEPENDENT_AMBULATORY_CARE_PROVIDER_SITE_OTHER): Payer: BC Managed Care – PPO | Admitting: Family Medicine

## 2019-11-16 VITALS — BP 110/70 | HR 56 | Temp 97.4°F | Ht 68.5 in | Wt 165.7 lb

## 2019-11-16 DIAGNOSIS — Z0000 Encounter for general adult medical examination without abnormal findings: Secondary | ICD-10-CM | POA: Diagnosis not present

## 2019-11-16 LAB — CBC WITH DIFFERENTIAL/PLATELET
Basophils Absolute: 0 10*3/uL (ref 0.0–0.1)
Basophils Relative: 0.7 % (ref 0.0–3.0)
Eosinophils Absolute: 0.1 10*3/uL (ref 0.0–0.7)
Eosinophils Relative: 2.2 % (ref 0.0–5.0)
HCT: 47.6 % (ref 39.0–52.0)
Hemoglobin: 15.9 g/dL (ref 13.0–17.0)
Lymphocytes Relative: 42.5 % (ref 12.0–46.0)
Lymphs Abs: 1.9 10*3/uL (ref 0.7–4.0)
MCHC: 33.4 g/dL (ref 30.0–36.0)
MCV: 84.2 fl (ref 78.0–100.0)
Monocytes Absolute: 0.5 10*3/uL (ref 0.1–1.0)
Monocytes Relative: 11.3 % (ref 3.0–12.0)
Neutro Abs: 2 10*3/uL (ref 1.4–7.7)
Neutrophils Relative %: 43.3 % (ref 43.0–77.0)
Platelets: 48 10*3/uL — CL (ref 150.0–400.0)
RBC: 5.65 Mil/uL (ref 4.22–5.81)
RDW: 14 % (ref 11.5–15.5)
WBC: 4.5 10*3/uL (ref 4.0–10.5)

## 2019-11-16 LAB — HEPATIC FUNCTION PANEL
ALT: 24 U/L (ref 0–53)
AST: 30 U/L (ref 0–37)
Albumin: 4.2 g/dL (ref 3.5–5.2)
Alkaline Phosphatase: 61 U/L (ref 39–117)
Bilirubin, Direct: 0.1 mg/dL (ref 0.0–0.3)
Total Bilirubin: 0.7 mg/dL (ref 0.2–1.2)
Total Protein: 6.7 g/dL (ref 6.0–8.3)

## 2019-11-16 LAB — BASIC METABOLIC PANEL
BUN: 12 mg/dL (ref 6–23)
CO2: 29 mEq/L (ref 19–32)
Calcium: 9.4 mg/dL (ref 8.4–10.5)
Chloride: 100 mEq/L (ref 96–112)
Creatinine, Ser: 1.07 mg/dL (ref 0.40–1.50)
GFR: 89.4 mL/min (ref >60.00)
Glucose, Bld: 74 mg/dL (ref 70–99)
Potassium: 3.6 mEq/L (ref 3.5–5.1)
Sodium: 136 mEq/L (ref 135–145)

## 2019-11-16 LAB — LIPID PANEL
Cholesterol: 217 mg/dL — ABNORMAL HIGH (ref 0–200)
HDL: 54.3 mg/dL (ref >39.00)
LDL Cholesterol: 153 mg/dL — ABNORMAL HIGH (ref 0–99)
NonHDL: 162.57
Total CHOL/HDL Ratio: 4
Triglycerides: 50 mg/dL (ref 0.0–149.0)
VLDL: 10 mg/dL (ref 0.0–40.0)

## 2019-11-16 LAB — PSA: PSA: 0.3 ng/mL (ref 0.10–4.00)

## 2019-11-16 LAB — TSH: TSH: 2.02 u[IU]/mL (ref 0.35–4.50)

## 2019-11-16 NOTE — Patient Instructions (Signed)
Consider checking with insurance to see if they will cover colonoscopy screening prior to age 47.

## 2019-11-16 NOTE — Progress Notes (Signed)
Subjective:     Patient ID: Joseph Lindsey, male   DOB: [birth year], 48 y.o.   MRN: [id number]  HPI Joseph Lindsey is seen today for physical exam.  He has hypothyroidism and is on replacement.  He has chronic thrombocytopenia which is been stable for several years.  He has had past history of mild elevated liver transaminases.  His only regular medication is levothyroxine.  Family history reviewed.  His dad had type 2 diabetes he has had 4 sisters with type 2 diabetes.  No known family history of premature CAD.  No known history of cancer.  Patient is non-smoker.  No regular alcohol.  Currently not exercising regularly.  He declines flu vaccines.  Will need tetanus booster next year.  Past Medical History:  Diagnosis Date  . Fissure, anal    2003  . Migraines   . Primary thrombocytopenia, unspecified   . Thyroid disease    hypothyroid   Past Surgical History:  Procedure Laterality Date  . BREATH TEK H PYLORI N/A 11/11/2013   Procedure: BREATH TEK H PYLORI;  Surgeon: Beverley Fiedler, MD;  Location: [facility];  Service: Gastroenterology;  Laterality: N/A;    reports that he has never smoked. He has never used smokeless tobacco. He reports current alcohol use. He reports that he does not use drugs. family history includes Asthma in his brother, father, and sister; Cancer (age of onset: 89) in his brother; Diabetes in his father and sister; Hypertension in his father; Sickle cell trait in an other family member; Stomach cancer in his brother. No Known Allergies   Review of Systems  Constitutional: Negative for activity change, appetite change, fatigue and fever.  HENT: Negative for congestion, ear pain and trouble swallowing.   Eyes: Negative for pain and visual disturbance.  Respiratory: Negative for cough, shortness of breath and wheezing.   Cardiovascular: Negative for chest pain and palpitations.  Gastrointestinal: Negative for abdominal distention, abdominal pain, blood in  stool, constipation, diarrhea, nausea, rectal pain and vomiting.  Endocrine: Negative for polydipsia and polyuria.  Genitourinary: Negative for dysuria, hematuria and testicular pain.  Musculoskeletal: Negative for arthralgias and joint swelling.  Skin: Negative for rash.  Neurological: Negative for dizziness, syncope and headaches.  Hematological: Negative for adenopathy.  Psychiatric/Behavioral: Negative for confusion and dysphoric mood.       Objective:   Physical Exam Constitutional:      General: He is not in acute distress.    Appearance: He is well-developed.  HENT:     Head: Normocephalic and atraumatic.     Right Ear: External ear normal.     Left Ear: External ear normal.  Eyes:     Conjunctiva/sclera: Conjunctivae normal.     Pupils: Pupils are equal, round, and reactive to light.  Neck:     Musculoskeletal: Normal range of motion and neck supple.     Thyroid: No thyromegaly.  Cardiovascular:     Rate and Rhythm: Normal rate and regular rhythm.     Heart sounds: Normal heart sounds. No murmur.  Pulmonary:     Effort: No respiratory distress.     Breath sounds: No wheezing or rales.  Abdominal:     General: Bowel sounds are normal. There is no distension.     Palpations: Abdomen is soft. There is no mass.     Tenderness: There is no abdominal tenderness. There is no guarding or rebound.  Genitourinary:    Prostate: Normal.     Comments: Digital exam  reveals no rectal masses.  Prostate normal Lymphadenopathy:     Cervical: No cervical adenopathy.  Skin:    Findings: No rash.  Neurological:     Mental Status: He is alert and oriented to person, place, and time.     Cranial Nerves: No cranial nerve deficit.     Deep Tendon Reflexes: Reflexes normal.        Assessment:     Physical exam.  He has history of hypothyroidism and chronic mild thrombocytopenia.  We discussed the following health maintenance issues    Plan:     -Obtain follow-up labs including  PSA -Recommend flu vaccine but he declines -Tetanus booster by next year -We discussed colon cancer screening and reviewed American Cancer Society guidelines.  He will check to see if his insurance coverage will allow for colonoscopy prior to age 29  Eulas Post MD [facility]

## 2019-12-27 ENCOUNTER — Other Ambulatory Visit: Payer: Self-pay | Admitting: Family Medicine

## 2020-01-25 ENCOUNTER — Telehealth: Payer: Self-pay | Admitting: Oncology

## 2020-01-25 ENCOUNTER — Inpatient Hospital Stay: Payer: BC Managed Care – PPO | Attending: Oncology

## 2020-01-25 ENCOUNTER — Inpatient Hospital Stay (HOSPITAL_BASED_OUTPATIENT_CLINIC_OR_DEPARTMENT_OTHER): Payer: BC Managed Care – PPO | Admitting: Oncology

## 2020-01-25 ENCOUNTER — Other Ambulatory Visit: Payer: Self-pay

## 2020-01-25 VITALS — BP 113/51 | HR 57 | Temp 97.8°F | Resp 18 | Ht 68.5 in | Wt 170.6 lb

## 2020-01-25 DIAGNOSIS — D696 Thrombocytopenia, unspecified: Secondary | ICD-10-CM

## 2020-01-25 DIAGNOSIS — D693 Immune thrombocytopenic purpura: Secondary | ICD-10-CM | POA: Insufficient documentation

## 2020-01-25 LAB — CBC WITH DIFFERENTIAL (CANCER CENTER ONLY)
Abs Immature Granulocytes: 0.01 10*3/uL (ref 0.00–0.07)
Basophils Absolute: 0 10*3/uL (ref 0.0–0.1)
Basophils Relative: 1 %
Eosinophils Absolute: 0.1 10*3/uL (ref 0.0–0.5)
Eosinophils Relative: 3 %
HCT: 47.1 % (ref 39.0–52.0)
Hemoglobin: 15.4 g/dL (ref 13.0–17.0)
Immature Granulocytes: 0 %
Lymphocytes Relative: 42 %
Lymphs Abs: 1.8 10*3/uL (ref 0.7–4.0)
MCH: 28.2 pg (ref 26.0–34.0)
MCHC: 32.7 g/dL (ref 30.0–36.0)
MCV: 86.1 fL (ref 80.0–100.0)
Monocytes Absolute: 0.5 10*3/uL (ref 0.1–1.0)
Monocytes Relative: 11 %
Neutro Abs: 1.9 10*3/uL (ref 1.7–7.7)
Neutrophils Relative %: 43 %
Platelet Count: 55 10*3/uL — ABNORMAL LOW (ref 150–400)
RBC: 5.47 MIL/uL (ref 4.22–5.81)
RDW: 13.6 % (ref 11.5–15.5)
WBC Count: 4.3 10*3/uL (ref 4.0–10.5)
nRBC: 0 % (ref 0.0–0.2)

## 2020-01-25 NOTE — Telephone Encounter (Signed)
Scheduled appt per 2/16 los.  Spoke with pt and he is aware of his appt dates and time,

## 2020-01-25 NOTE — Progress Notes (Signed)
Hematology and Oncology Follow Up Visit  Joseph Lindsey [id number] 01-Jun-1971 49 y.o. 01/25/2020 9:40 AM  CC: [name], M.D.    Principle Diagnosis: 49 year old man with thrombocytopenia presented with platelet count around 60,000 in 2011.  Etiology is related to ITP.    Current therapy: Active surveillance.  Interim History:   [name] is here for a follow-up.  Since the last visit, he reports no major changes in his health.  He denies any hematochezia, melena or epistaxis.  He denies any hospitalizations or illnesses.  His performance status quality of life remain excellent.     Medications: Updated without changes. Current Outpatient Medications  Medication Sig Dispense Refill  . fexofenadine (ALLEGRA) 180 MG tablet Take 180 mg by mouth daily.    Marland Kitchen levothyroxine (SYNTHROID) 100 MCG tablet TAKE 1 TABLET DAILY 90 tablet 3  . triamcinolone cream (KENALOG) 0.1 % Apply topically 2 (two) times daily as needed. 30 g 2   No current facility-administered medications for this visit.    Allergies: No Known Allergies     Physical Exam:   Blood pressure (!) 113/51, pulse (!) 57, temperature 97.8 F (36.6 C), temperature source Temporal, resp. rate 18, height 5' 8.5" (1.74 m), weight 170 lb 9.6 oz (77.4 kg), SpO2 100 %.     ECOG: 0   General appearance: Comfortable appearing without any discomfort Head: Normocephalic without any trauma Oropharynx: Mucous membranes are moist and pink without any thrush or ulcers. Eyes: Pupils are equal and round reactive to light. Lymph nodes: No cervical, supraclavicular, inguinal or axillary lymphadenopathy.   Heart:regular rate and rhythm.  S1 and S2 without leg edema. Lung: Clear without any rhonchi or wheezes.  No dullness to percussion. Abdomin: Soft, nontender, nondistended with good bowel sounds.  No hepatosplenomegaly. Musculoskeletal: No joint deformity or effusion.  Full range of motion noted. Neurological: No  deficits noted on motor, sensory and deep tendon reflex exam. Skin: No petechial rash or dryness.  Appeared moist.       Lab Results: Lab Results  Component Value Date   WBC 4.5 11/16/2019   HGB 15.9 11/16/2019   HCT 47.6 11/16/2019   MCV 84.2 11/16/2019   PLT 48.0 (LL) 11/16/2019     Chemistry      Component Value Date/Time   NA 136 11/16/2019 0933   NA 140 08/17/2015 1114   K 3.6 11/16/2019 0933   K 4.1 08/17/2015 1114   CL 100 11/16/2019 0933   CL 106 12/15/2012 0948   CO2 29 11/16/2019 0933   CO2 26 08/17/2015 1114   BUN 12 11/16/2019 0933   BUN 10.3 08/17/2015 1114   CREATININE 1.07 11/16/2019 0933   CREATININE 1.2 08/17/2015 1114      Component Value Date/Time   CALCIUM 9.4 11/16/2019 0933   CALCIUM 9.4 08/17/2015 1114   ALKPHOS 61 11/16/2019 0933   ALKPHOS 67 08/17/2015 1114   AST 30 11/16/2019 0933   AST 24 08/17/2015 1114   ALT 24 11/16/2019 0933   ALT 30 08/17/2015 1114   BILITOT 0.7 11/16/2019 0933   BILITOT 0.60 08/17/2015 1114        Impression and plan:  49 year old man with:  1.  ITP diagnosed in 2011.  His platelet counts have ranged between 40 and 60,000 without any indication for treatment.  He has been on active surveillance since that time.  Laboratory data obtained in December 2020 showed platelet count 48,000 without any active bleeding.  Previous count in August  was 66.  Laboratory data from today showed a platelet count of 55,000 without any active bleeding.  Risks and benefits of continued active surveillance versus intervention were reviewed today.  Interventions such as steroids, IVIG or rituximab were reviewed and at this time there are not needed.  Platelet count of 55,000 should be adequate to stand any surgery including dental procedures.   2.  Follow-up: In 6 months for repeat evaluation.  20 minutes was spent on this encounter.  The time was dedicated to reviewing his disease status, treatment options and answering questions  regarding future plan of care.    Zola Button, MD 2/16/20219:40 AM

## 2020-02-04 ENCOUNTER — Telehealth: Payer: Self-pay | Admitting: Family Medicine

## 2020-02-04 ENCOUNTER — Encounter: Payer: Self-pay | Admitting: Family Medicine

## 2020-02-04 NOTE — Telephone Encounter (Signed)
Please advise 

## 2020-02-04 NOTE — Telephone Encounter (Signed)
Spoke with patient and he stated that blood isn't coming out of his eye, it is really red, almost look like blood vessels in his eye may have popped. Patient stated he woke up yesterday with his right eye like this. Patient stated that he would send picture through my chart for PCP to see.

## 2020-02-04 NOTE — Telephone Encounter (Signed)
Sounds like we should set up office follow up to further assess- unless he has any symptoms such as blurred vision or eye pain.  If so needs more urgent attention.  Sounds like this is probably more likely a benign subconjunctival hemorrhage.

## 2020-02-04 NOTE — Telephone Encounter (Signed)
Pt called in asking for a referral for an eye doc. Pt states that he woke up yesterday with blood in his R. Ear. This morning he woke up and there was more blood in the same ear. He is also having a dull pain/pressure in that ear. Asked pt if it is affecting his vision or eyes any and he said no.  Offered to make an appt with PCP but he said "he doesn't know if he needs to see him and if Burchette believes he should then he will"  Pt can be reached at [phone] --ok to leave detailed messaged at that number per pt.

## 2020-02-04 NOTE — Telephone Encounter (Signed)
Please clarify.  Does he need to see ENT for ear or ophthalmologist for eye?  If blood from ear we should see first.

## 2020-02-07 DIAGNOSIS — H40003 Preglaucoma, unspecified, bilateral: Secondary | ICD-10-CM | POA: Diagnosis not present

## 2020-02-07 DIAGNOSIS — H43812 Vitreous degeneration, left eye: Secondary | ICD-10-CM | POA: Diagnosis not present

## 2020-02-07 NOTE — Telephone Encounter (Signed)
Patient scheduled for 02/09/20

## 2020-02-08 ENCOUNTER — Other Ambulatory Visit: Payer: Self-pay

## 2020-02-09 ENCOUNTER — Ambulatory Visit: Payer: BC Managed Care – PPO | Admitting: Family Medicine

## 2020-02-11 ENCOUNTER — Ambulatory Visit (INDEPENDENT_AMBULATORY_CARE_PROVIDER_SITE_OTHER): Payer: BC Managed Care – PPO | Admitting: Family Medicine

## 2020-02-11 ENCOUNTER — Other Ambulatory Visit: Payer: Self-pay

## 2020-02-11 ENCOUNTER — Encounter: Payer: Self-pay | Admitting: Family Medicine

## 2020-02-11 ENCOUNTER — Other Ambulatory Visit: Payer: Self-pay | Admitting: Family Medicine

## 2020-02-11 VITALS — BP 120/82 | HR 57 | Temp 97.7°F | Ht 68.5 in | Wt 168.7 lb

## 2020-02-11 DIAGNOSIS — H1131 Conjunctival hemorrhage, right eye: Secondary | ICD-10-CM

## 2020-02-11 NOTE — Patient Instructions (Signed)
Subconjunctival Hemorrhage Subconjunctival hemorrhage is bleeding that happens between the white part of your eye (sclera) and the clear membrane that covers the outside of your eye (conjunctiva). There are many tiny blood vessels near the surface of your eye. A subconjunctival hemorrhage happens when one or more of these vessels breaks and bleeds, causing a red patch to appear on your eye. This is similar to a bruise. Depending on the amount of bleeding, the red patch may only cover a small area of your eye or it may cover the entire visible part of the sclera. If a lot of blood collects under the conjunctiva, there may also be swelling. Subconjunctival hemorrhages do not affect your vision or cause pain, but your eye may feel irritated if there is swelling. Subconjunctival hemorrhages usually do not require treatment, and they usually disappear on their own within two weeks. What are the causes? This condition may be caused by:  Mild trauma, such as rubbing your eye too hard.  Blunt injuries, such as from playing sports or having contact with a deployed airbag.  Coughing, sneezing, or vomiting.  Straining, such as when lifting a heavy object.  High blood pressure.  Recent eye surgery.  Diabetes.  Certain medicines, especially blood thinners (anticoagulants).  Other conditions, such as eye tumors, bleeding disorders, or blood vessel abnormalities. Subconjunctival hemorrhages can also happen without an obvious cause. What are the signs or symptoms? Symptoms of this condition include:  A bright red or dark red patch on the white part of the eye. The red area may: ? Spread out to cover a larger area of the eye before it goes away. ? Turn brownish-yellow before it goes away.  Swelling around the eye.  Mild eye irritation. How is this diagnosed? This condition is diagnosed with a physical exam. If your subconjunctival hemorrhage was caused by trauma, your health care provider may refer  you to an eye specialist (ophthalmologist) or another specialist to check for other injuries. You may have other tests, including:  An eye exam.  A blood pressure check.  Blood tests to check for bleeding disorders. If your subconjunctival hemorrhage was caused by trauma, X-rays or a CT scan may be done to check for other injuries. How is this treated? Usually, treatment is not needed for this condition. If you have discomfort, your health care provider may recommend eye drops or cold compresses. Follow these instructions at home:  Take over-the-counter and prescription medicines only as directed by your health care provider.  Use eye drops or cold compresses to help with discomfort as directed by your health care provider.  Avoid activities, things, and environments that may irritate or injure your eye.  Keep all follow-up visits as told by your health care provider. This is important. Contact a health care provider if:  You have pain in your eye.  The bleeding does not go away within 3 weeks.  You keep getting new subconjunctival hemorrhages. Get help right away if:  Your vision changes or you have difficulty seeing.  You suddenly develop severe sensitivity to light.  You develop a severe headache, persistent vomiting, confusion, or abnormal tiredness (lethargy).  Your eye seems to bulge or protrude from your eye socket.  You develop unexplained bruises on your body.  You have unexplained bleeding in another area of your body. Summary  Subconjunctival hemorrhage is bleeding that happens between the white part of your eye and the clear membrane that covers the outside of your eye.  This condition   is similar to a bruise.  Subconjunctival hemorrhages usually do not require treatment, and they usually disappear on their own within two weeks.  Use eye drops or cold compresses to help with discomfort as directed by your health care provider. This information is not  intended to replace advice given to you by your health care provider. Make sure you discuss any questions you have with your health care provider. Document Revised: 05/12/2019 Document Reviewed: 08/26/2018 Elsevier Patient Education  2020 Elsevier Inc.  

## 2020-02-11 NOTE — Progress Notes (Signed)
Acute Office Visit  Subjective:    Patient ID: Joseph Lindsey, male    DOB: December 11, 1970, 49 y.o.   MRN: [id number]  Chief Complaint  Patient presents with  . Follow-up    redness in right eye    HPI Patient is in today for redness in right eye present since he woke up on the morning of Wed. Feb. 24, 10 days ago. He states redness is improving and denies visual changes. Denies drainage, tearing, or pain. No injury to eye that he is aware of and no changes in physical activity prior to awakening with the redness. He had a dilated eye exam at optometrist earlier this week and reports exam was normal. He is concerned about headache that occurred the day before the eye redness. Headache resolved on its own later the same day.    Past Medical History:  Diagnosis Date  . Fissure, anal    2003  . Migraines   . Primary thrombocytopenia, unspecified   . Thyroid disease    hypothyroid    Past Surgical History:  Procedure Laterality Date  . BREATH TEK H PYLORI N/A 11/11/2013   Procedure: BREATH TEK H PYLORI;  Surgeon: Jerene Bears, MD;  Location: [facility];  Service: Gastroenterology;  Laterality: N/A;    Family History  Problem Relation Age of Onset  . Hypertension Father   . Diabetes Father   . Asthma Father   . Diabetes Sister   . Asthma Sister   . Asthma Brother   . Cancer Brother 31       gastric cancer  . Stomach cancer Brother   . Sickle cell trait Other        mother  . Colon cancer Neg Hx   . Esophageal cancer Neg Hx   . Rectal cancer Neg Hx     Social History   Socioeconomic History  . Marital status: Married    Spouse name: Not on file  . Number of children: 1  . Years of education: Not on file  . Highest education level: Not on file  Occupational History    Employer: [employer]  Tobacco Use  . Smoking status: Never Smoker  . Smokeless tobacco: Never Used  Substance and Sexual Activity  . Alcohol use: Yes    Comment: once a week  . Drug  use: No  . Sexual activity: Not on file  Other Topics Concern  . Not on file  Social History Narrative  . Not on file   Social Determinants of Health   Financial Resource Strain:   . Difficulty of Paying Living Expenses: Not on file  Food Insecurity:   . Worried About Charity fundraiser in the Last Year: Not on file  . Ran Out of Food in the Last Year: Not on file  Transportation Needs:   . Lack of Transportation (Medical): Not on file  . Lack of Transportation (Non-Medical): Not on file  Physical Activity:   . Days of Exercise per Week: Not on file  . Minutes of Exercise per Session: Not on file  Stress:   . Feeling of Stress : Not on file  Social Connections:   . Frequency of Communication with Friends and Family: Not on file  . Frequency of Social Gatherings with Friends and Family: Not on file  . Attends Religious Services: Not on file  . Active Member of Clubs or Organizations: Not on file  . Attends Archivist Meetings: Not on  file  . Marital Status: Not on file  Intimate Partner Violence:   . Fear of Current or Ex-Partner: Not on file  . Emotionally Abused: Not on file  . Physically Abused: Not on file  . Sexually Abused: Not on file    Outpatient Medications Prior to Visit  Medication Sig Dispense Refill  . fexofenadine (ALLEGRA) 180 MG tablet Take 180 mg by mouth daily.    Marland Kitchen levothyroxine (SYNTHROID) 100 MCG tablet TAKE 1 TABLET DAILY 90 tablet 3  . triamcinolone cream (KENALOG) 0.1 % Apply topically 2 (two) times daily as needed. 30 g 2   No facility-administered medications prior to visit.    No Known Allergies  Review of Systems   GEN: Negative for cough, fever, illness, fatigue. HEENT: Negative for headache, sinus tenderness. Neck: Negative for pain, decreased ROM. Neuro: Negative for loss of sensation, weakness or sensory deficit. MSK: Negative for loss of strength, ROM. Skin: Negative for rash, erythema, excessive dryness.    Objective:    Physical Exam   GEN: Well nourished, well developed, in no acute distress, Alert and interactive HEENT: Head: normocephalic, atraumatic.  Eyes: PERRL, EOMI, good conjugate gaze. Subconjunctival hemorrhage distal right eye. No drainage or exudate. Conjunctiva pink, no drainage. Nares symmetric, patent.  Moist mucous membranes Neck: Supple, no JVD or or masses. Normal ROM. No masses, no lymphadenopathy Cardiac: Denies chest pain, no edema  Respiratory: normal work of breathing GI: Nondistended MS: No deformity or atrophy, normal muscle tone and strength Skin: Intact, warm and dry, no rashes, no lesions, no erythema Neuro:  Alert and Oriented x 3, Cranial nerves II to XII intact, Reflex symmetric, Sensation intact, follows commands, gait normal Psych: euthymic mood, full affect   BP 120/82   Pulse (!) 57   Temp 97.7 F (36.5 C) (Temporal)   Ht 5' 8.5" (1.74 m)   Wt 168 lb 11.2 oz (76.5 kg)   SpO2 98%   BMI 25.28 kg/m  Wt Readings from Last 3 Encounters:  02/11/20 168 lb 11.2 oz (76.5 kg)  01/25/20 170 lb 9.6 oz (77.4 kg)  11/16/19 165 lb 11.2 oz (75.2 kg)    There are no preventive care reminders to display for this patient.  There are no preventive care reminders to display for this patient.   Lab Results  Component Value Date   TSH 2.02 11/16/2019   Lab Results  Component Value Date   WBC 4.3 01/25/2020   HGB 15.4 01/25/2020   HCT 47.1 01/25/2020   MCV 86.1 01/25/2020   PLT 55 (L) 01/25/2020   Lab Results  Component Value Date   NA 136 11/16/2019   K 3.6 11/16/2019   CHLORIDE 107 08/17/2015   CO2 29 11/16/2019   GLUCOSE 74 11/16/2019   BUN 12 11/16/2019   CREATININE 1.07 11/16/2019   BILITOT 0.7 11/16/2019   ALKPHOS 61 11/16/2019   AST 30 11/16/2019   ALT 24 11/16/2019   PROT 6.7 11/16/2019   ALBUMIN 4.2 11/16/2019   CALCIUM 9.4 11/16/2019   ANIONGAP 8 08/17/2015   EGFR 88 (L) 08/17/2015   GFR 89.40 11/16/2019   Lab Results   Component Value Date   CHOL 217 (H) 11/16/2019   Lab Results  Component Value Date   HDL 54.30 11/16/2019   Lab Results  Component Value Date   LDLCALC 153 (H) 11/16/2019   Lab Results  Component Value Date   TRIG 50.0 11/16/2019   Lab Results  Component Value Date  CHOLHDL 4 11/16/2019   Lab Results  Component Value Date   HGBA1C 5.5 12/28/2018       Assessment & Plan:   Problem List Items Addressed This Visit    None     ASSESSMENT: 1. Subconjunctival hemorrhage: Visual acuity exam 20/10 bilaterally. Area of redness lateral to iris in the right eye appears to be diminishing. Expect resolution on its own.  PLAN: 1. Continue to monitor eye and call with concerns.  No orders of the defined types were placed in this encounter.    Emmaline Life, RN, BSN AGPCNP Student, [employer] [facility]  Agree with assessment and plan as above.  Benign appearing right subconjunctival hemorrhage which is resolving with no concerns.  Eulas Post MD [facility] at [facility]

## 2020-07-25 ENCOUNTER — Encounter: Payer: Self-pay | Admitting: Oncology

## 2020-07-25 ENCOUNTER — Inpatient Hospital Stay: Payer: BC Managed Care – PPO

## 2020-07-25 ENCOUNTER — Other Ambulatory Visit: Payer: Self-pay

## 2020-07-25 ENCOUNTER — Other Ambulatory Visit: Payer: Self-pay | Admitting: Oncology

## 2020-07-25 ENCOUNTER — Inpatient Hospital Stay: Payer: BC Managed Care – PPO | Attending: Oncology | Admitting: Oncology

## 2020-07-25 ENCOUNTER — Telehealth: Payer: Self-pay | Admitting: Oncology

## 2020-07-25 VITALS — BP 138/73 | HR 61 | Temp 97.8°F | Resp 18 | Ht 68.0 in | Wt 170.5 lb

## 2020-07-25 DIAGNOSIS — D696 Thrombocytopenia, unspecified: Secondary | ICD-10-CM | POA: Diagnosis not present

## 2020-07-25 DIAGNOSIS — D693 Immune thrombocytopenic purpura: Secondary | ICD-10-CM | POA: Diagnosis not present

## 2020-07-25 LAB — CBC WITH DIFFERENTIAL (CANCER CENTER ONLY)
Abs Immature Granulocytes: 0 10*3/uL (ref 0.00–0.07)
Basophils Absolute: 0 10*3/uL (ref 0.0–0.1)
Basophils Relative: 1 %
Eosinophils Absolute: 0.1 10*3/uL (ref 0.0–0.5)
Eosinophils Relative: 3 %
HCT: 47.1 % (ref 39.0–52.0)
Hemoglobin: 15.4 g/dL (ref 13.0–17.0)
Immature Granulocytes: 0 %
Lymphocytes Relative: 40 %
Lymphs Abs: 1.6 10*3/uL (ref 0.7–4.0)
MCH: 27.1 pg (ref 26.0–34.0)
MCHC: 32.7 g/dL (ref 30.0–36.0)
MCV: 82.8 fL (ref 80.0–100.0)
Monocytes Absolute: 0.5 10*3/uL (ref 0.1–1.0)
Monocytes Relative: 13 %
Neutro Abs: 1.7 10*3/uL (ref 1.7–7.7)
Neutrophils Relative %: 43 %
Platelet Count: 60 10*3/uL — ABNORMAL LOW (ref 150–400)
RBC: 5.69 MIL/uL (ref 4.22–5.81)
RDW: 13.5 % (ref 11.5–15.5)
WBC Count: 3.9 10*3/uL — ABNORMAL LOW (ref 4.0–10.5)
nRBC: 0 % (ref 0.0–0.2)

## 2020-07-25 NOTE — Telephone Encounter (Signed)
Scheduled per los. Patient will call to scheduled covid vaccine. Declined printout

## 2020-07-25 NOTE — Progress Notes (Signed)
Hematology and Oncology Follow Up Visit  HERSEY MACLELLAN [id number] 05-26-1971 49 y.o. 07/25/2020 10:20 AM  CC: [name], M.D.    Principle Diagnosis: 49 year old man with ITP diagnosed and 2011.  He presented with platelet count that is fluctuating above 50,000.  Current therapy: Active surveillance.  Interim History:   Mr. Biss returns today for a repeat evaluation.  Since the last visit, he reports no major changes in his health.  He denies any recent hospitalization or illnesses.  He denies any recurrent bleeding episodes.  Continues to be active and attends to activities of daily living.     Medications: Reviewed without changes. Current Outpatient Medications  Medication Sig Dispense Refill  . fexofenadine (ALLEGRA) 180 MG tablet Take 180 mg by mouth daily.    Marland Kitchen levothyroxine (SYNTHROID) 100 MCG tablet TAKE 1 TABLET DAILY 90 tablet 3  . triamcinolone cream (KENALOG) 0.1 % APPLY TOPICALLY TWICE A DAY AS NEEDED 30 g 2   No current facility-administered medications for this visit.    Allergies: No Known Allergies     Physical Exam:   Blood pressure 138/73, pulse 61, temperature 97.8 F (36.6 C), temperature source Temporal, resp. rate 18, height 5\' 8"  (1.727 m), weight 170 lb 8 oz (77.3 kg), SpO2 100 %.      ECOG: 0    General appearance: Alert, awake without any distress. Head: Atraumatic without abnormalities Oropharynx: Without any thrush or ulcers. Eyes: No scleral icterus. Lymph nodes: No lymphadenopathy noted in the cervical, supraclavicular, or axillary nodes Heart:regular rate and rhythm, without any murmurs or gallops.   Lung: Clear to auscultation without any rhonchi, wheezes or dullness to percussion. Abdomin: Soft, nontender without any shifting dullness or ascites. Musculoskeletal: No clubbing or cyanosis. Neurological: No motor or sensory deficits. Skin: No rashes or lesions.       Lab Results: Lab Results  Component Value  Date   WBC 4.3 01/25/2020   HGB 15.4 01/25/2020   HCT 47.1 01/25/2020   MCV 86.1 01/25/2020   PLT 55 (L) 01/25/2020     Chemistry      Component Value Date/Time   NA 136 11/16/2019 0933   NA 140 08/17/2015 1114   K 3.6 11/16/2019 0933   K 4.1 08/17/2015 1114   CL 100 11/16/2019 0933   CL 106 12/15/2012 0948   CO2 29 11/16/2019 0933   CO2 26 08/17/2015 1114   BUN 12 11/16/2019 0933   BUN 10.3 08/17/2015 1114   CREATININE 1.07 11/16/2019 0933   CREATININE 1.2 08/17/2015 1114      Component Value Date/Time   CALCIUM 9.4 11/16/2019 0933   CALCIUM 9.4 08/17/2015 1114   ALKPHOS 61 11/16/2019 0933   ALKPHOS 67 08/17/2015 1114   AST 30 11/16/2019 0933   AST 24 08/17/2015 1114   ALT 24 11/16/2019 0933   ALT 30 08/17/2015 1114   BILITOT 0.7 11/16/2019 0933   BILITOT 0.60 08/17/2015 1114        Impression and plan:  49 year old man with:  1.  Chronic thrombocytopenia diagnosed in 2011.  The differential diagnosis including ITP versus inherited platelet disorder.   Laboratory data for the last 10 years were reviewed again and his platelet count continues to fluctuate mostly above 50,000 without any active bleeding.  The differential diagnosis was reviewed again with the patient which includes ITP versus inherited platelet disorder.  Treatment options for ITP were reviewed which includes trial of steroids, IVIG or rituximab.  These will be deferred  unless he has worsening platelet count or bleeding issues.  Laboratory data from today reviewed and showed a platelet count of at least 60,000.  No active intervention is needed.   2.  Covid vaccination considerations: He had concerns and questions regarding receiving vaccination with his platelet count.  I assured him that mRNA based vaccines are not associated with any hematological conditions documented so far.  I recommended that he proceeds with that vaccination risks are minimal compared to the benefits.  3.  Follow-up: He will  return in 6 months for a follow-up evaluation.  30  minutes were dedicated to this visit. The time was spent on reviewing laboratory data, discussing treatment options, discussing differential diagnosis and answering questions regarding future plan.    Eli Hose, MD 8/17/202110:20 AM

## 2020-11-22 ENCOUNTER — Encounter: Payer: Self-pay | Admitting: Family Medicine

## 2020-11-22 ENCOUNTER — Other Ambulatory Visit: Payer: Self-pay

## 2020-11-22 ENCOUNTER — Ambulatory Visit (INDEPENDENT_AMBULATORY_CARE_PROVIDER_SITE_OTHER): Payer: BC Managed Care – PPO | Admitting: Family Medicine

## 2020-11-22 VITALS — BP 104/68 | HR 55 | Temp 98.1°F | Ht 69.5 in | Wt 172.8 lb

## 2020-11-22 DIAGNOSIS — Z0000 Encounter for general adult medical examination without abnormal findings: Secondary | ICD-10-CM | POA: Diagnosis not present

## 2020-11-22 DIAGNOSIS — Z23 Encounter for immunization: Secondary | ICD-10-CM | POA: Diagnosis not present

## 2020-11-22 NOTE — Progress Notes (Signed)
Established Patient Office Visit  Subjective:  Patient ID: Joseph Lindsey, male    DOB: 20-Mar-1971  Age: 49 y.o. MRN: [id number]  CC:  Chief Complaint  Patient presents with  . Annual Exam    HPI Joseph Lindsey presents for annual physical exam.  He has history of primary thrombocytopenia and hypothyroidism.  He is followed by hematologist regarding his chronic low platelets which have been stable.  He remains on levothyroxine 100 mcg daily for his thyroid.  Generally feels well.  Not exercising currently.  Health maintenance reviewed.  He declines flu vaccines.  He is due for tetanus booster.  He has declined Covid vaccines He had colonoscopy and upper endoscopy 2014.  No polyps were noted.  Family history significant for father with hypertension diabetes.  Sister with diabetes.  Brother had gastric cancer age 47  Social history-he is married.  Non-smoker.  No alcohol use. One child.  Works for [employer].      Past Medical History:  Diagnosis Date  . Fissure, anal    2003  . Migraines   . Primary thrombocytopenia, unspecified   . Thyroid disease    hypothyroid    Past Surgical History:  Procedure Laterality Date  . BREATH TEK H PYLORI N/A 11/11/2013   Procedure: BREATH TEK H PYLORI;  Surgeon: Jerene Bears, MD;  Location: [facility];  Service: Gastroenterology;  Laterality: N/A;    Family History  Problem Relation Age of Onset  . Hypertension Father   . Diabetes Father   . Asthma Father   . Diabetes Sister   . Asthma Sister   . Asthma Brother   . Cancer Brother 87       gastric cancer  . Stomach cancer Brother   . Sickle cell trait Other        mother  . Colon cancer Neg Hx   . Esophageal cancer Neg Hx   . Rectal cancer Neg Hx     Social History   Socioeconomic History  . Marital status: Married    Spouse name: Not on file  . Number of children: 1  . Years of education: Not on file  . Highest education level: Not on file  Occupational  History    Employer: [employer]  Tobacco Use  . Smoking status: Never Smoker  . Smokeless tobacco: Never Used  Vaping Use  . Vaping Use: Never used  Substance and Sexual Activity  . Alcohol use: Yes    Comment: once a week  . Drug use: No  . Sexual activity: Not on file  Other Topics Concern  . Not on file  Social History Narrative  . Not on file   Social Determinants of Health   Financial Resource Strain: Not on file  Food Insecurity: Not on file  Transportation Needs: Not on file  Physical Activity: Not on file  Stress: Not on file  Social Connections: Not on file  Intimate Partner Violence: Not on file    Outpatient Medications Prior to Visit  Medication Sig Dispense Refill  . fexofenadine (ALLEGRA) 180 MG tablet Take 180 mg by mouth daily.    Marland Kitchen levothyroxine (SYNTHROID) 100 MCG tablet TAKE 1 TABLET DAILY 90 tablet 3  . triamcinolone cream (KENALOG) 0.1 % APPLY TOPICALLY TWICE A DAY AS NEEDED 30 g 2   No facility-administered medications prior to visit.    No Known Allergies  ROS Review of Systems  Constitutional: Negative for activity change, appetite change, fatigue  and fever.  HENT: Negative for congestion, ear pain and trouble swallowing.   Eyes: Negative for pain and visual disturbance.  Respiratory: Negative for cough, shortness of breath and wheezing.   Cardiovascular: Negative for chest pain and palpitations.  Gastrointestinal: Negative for abdominal distention, abdominal pain, blood in stool, constipation, diarrhea, nausea, rectal pain and vomiting.  Genitourinary: Negative for dysuria, hematuria and testicular pain.  Musculoskeletal: Negative for arthralgias and joint swelling.  Skin: Negative for rash.  Neurological: Negative for dizziness, syncope and headaches.  Hematological: Negative for adenopathy.  Psychiatric/Behavioral: Negative for confusion and dysphoric mood.      Objective:    Physical Exam Constitutional:      General: He  is not in acute distress.    Appearance: He is well-developed and well-nourished.  HENT:     Head: Normocephalic and atraumatic.     Right Ear: External ear normal.     Left Ear: External ear normal.     Mouth/Throat:     Mouth: Oropharynx is clear and moist.  Eyes:     Extraocular Movements: EOM normal.     Conjunctiva/sclera: Conjunctivae normal.     Pupils: Pupils are equal, round, and reactive to light.  Neck:     Thyroid: No thyromegaly.  Cardiovascular:     Rate and Rhythm: Normal rate and regular rhythm.     Heart sounds: Normal heart sounds. No murmur heard.   Pulmonary:     Effort: No respiratory distress.     Breath sounds: No wheezing or rales.  Abdominal:     General: Bowel sounds are normal. There is no distension.     Palpations: Abdomen is soft. There is no mass.     Tenderness: There is no abdominal tenderness. There is no guarding or rebound.  Musculoskeletal:        General: No edema.     Cervical back: Normal range of motion and neck supple.     Right lower leg: No edema.     Left lower leg: No edema.  Lymphadenopathy:     Cervical: No cervical adenopathy.  Skin:    Findings: No rash.  Neurological:     Mental Status: He is alert and oriented to person, place, and time.     Cranial Nerves: No cranial nerve deficit.     Deep Tendon Reflexes: Reflexes normal.  Psychiatric:        Mood and Affect: Mood and affect normal.     BP 104/68 (BP Location: Left Arm, Patient Position: Sitting, Cuff Size: Normal)   Pulse (!) 55   Temp 98.1 F (36.7 C) (Oral)   Ht 5' 9.5" (1.765 m)   Wt 172 lb 12.8 oz (78.4 kg)   BMI 25.15 kg/m  Wt Readings from Last 3 Encounters:  11/22/20 172 lb 12.8 oz (78.4 kg)  07/25/20 170 lb 8 oz (77.3 kg)  02/11/20 168 lb 11.2 oz (76.5 kg)     Health Maintenance Due  Topic Date Due  . TETANUS/TDAP  05/10/2020    There are no preventive care reminders to display for this patient.  Lab Results  Component Value Date   TSH  2.02 11/16/2019   Lab Results  Component Value Date   WBC 3.9 (L) 07/25/2020   HGB 15.4 07/25/2020   HCT 47.1 07/25/2020   MCV 82.8 07/25/2020   PLT 60 (L) 07/25/2020   Lab Results  Component Value Date   NA 136 11/16/2019   K 3.6 11/16/2019  CHLORIDE 107 08/17/2015   CO2 29 11/16/2019   GLUCOSE 74 11/16/2019   BUN 12 11/16/2019   CREATININE 1.07 11/16/2019   BILITOT 0.7 11/16/2019   ALKPHOS 61 11/16/2019   AST 30 11/16/2019   ALT 24 11/16/2019   PROT 6.7 11/16/2019   ALBUMIN 4.2 11/16/2019   CALCIUM 9.4 11/16/2019   ANIONGAP 8 08/17/2015   EGFR 88 (L) 08/17/2015   GFR 89.40 11/16/2019   Lab Results  Component Value Date   CHOL 217 (H) 11/16/2019   Lab Results  Component Value Date   HDL 54.30 11/16/2019   Lab Results  Component Value Date   LDLCALC 153 (H) 11/16/2019   Lab Results  Component Value Date   TRIG 50.0 11/16/2019   Lab Results  Component Value Date   CHOLHDL 4 11/16/2019   Lab Results  Component Value Date   HGBA1C 5.5 12/28/2018      Assessment & Plan:   Problem List Items Addressed This Visit   None   Visit Diagnoses    Physical exam    -  Primary   Relevant Orders   Basic metabolic panel   Lipid panel   CBC with Differential/Platelet   TSH   Hepatic function panel   PSA   Hemoglobin A1c    -Tdap given -Obtain follow-up labs as above.  Will obtain A1c with strong family history of diabetes -He is encouraged to establish more regular exercise habits -Consider repeat colonoscopy in a couple years. -Flu vaccine offered but patient declines.  No orders of the defined types were placed in this encounter.   Follow-up: No follow-ups on file.    Carolann Littler, MD

## 2020-11-22 NOTE — Patient Instructions (Signed)
Preventive Care 40-48 Years Old, Male °Preventive care refers to lifestyle choices and visits with your health care provider that can promote health and wellness. This includes: °· A yearly physical exam. This is also called an annual well check. °· Regular dental and eye exams. °· Immunizations. °· Screening for certain conditions. °· Healthy lifestyle choices, such as eating a healthy diet, getting regular exercise, not using drugs or products that contain nicotine and tobacco, and limiting alcohol use. °What can I expect for my preventive care visit? °Physical exam °Your health care provider will check: °· Height and weight. These may be used to calculate body mass index (BMI), which is a measurement that tells if you are at a healthy weight. °· Heart rate and blood pressure. °· Your skin for abnormal spots. °Counseling °Your health care provider may ask you questions about: °· Alcohol, tobacco, and drug use. °· Emotional well-being. °· Home and relationship well-being. °· Sexual activity. °· Eating habits. °· Work and work environment. °What immunizations do I need? ° °Influenza (flu) vaccine °· This is recommended every year. °Tetanus, diphtheria, and pertussis (Tdap) vaccine °· You may need a Td booster every 10 years. °Varicella (chickenpox) vaccine °· You may need this vaccine if you have not already been vaccinated. °Zoster (shingles) vaccine °· You may need this after age 60. °Measles, mumps, and rubella (MMR) vaccine °· You may need at least one dose of MMR if you were born in 1957 or later. You may also need a second dose. °Pneumococcal conjugate (PCV13) vaccine °· You may need this if you have certain conditions and were not previously vaccinated. °Pneumococcal polysaccharide (PPSV23) vaccine °· You may need one or two doses if you smoke cigarettes or if you have certain conditions. °Meningococcal conjugate (MenACWY) vaccine °· You may need this if you have certain conditions. °Hepatitis A  vaccine °· You may need this if you have certain conditions or if you travel or work in places where you may be exposed to hepatitis A. °Hepatitis B vaccine °· You may need this if you have certain conditions or if you travel or work in places where you may be exposed to hepatitis B. °Haemophilus influenzae type b (Hib) vaccine °· You may need this if you have certain risk factors. °Human papillomavirus (HPV) vaccine °· If recommended by your health care provider, you may need three doses over 6 months. °You may receive vaccines as individual doses or as more than one vaccine together in one shot (combination vaccines). Talk with your health care provider about the risks and benefits of combination vaccines. °What tests do I need? °Blood tests °· Lipid and cholesterol levels. These may be checked every 5 years, or more frequently if you are over 50 years old. °· Hepatitis C test. °· Hepatitis B test. °Screening °· Lung cancer screening. You may have this screening every year starting at age 55 if you have a 30-pack-year history of smoking and currently smoke or have quit within the past 15 years. °· Prostate cancer screening. Recommendations will vary depending on your family history and other risks. °· Colorectal cancer screening. All adults should have this screening starting at age 50 and continuing until age 75. Your health care provider may recommend screening at age 45 if you are at increased risk. You will have tests every 1-10 years, depending on your results and the type of screening test. °· Diabetes screening. This is done by checking your blood sugar (glucose) after you have not eaten   for a while (fasting). You may have this done every 1-3 years.  Sexually transmitted disease (STD) testing. Follow these instructions at home: Eating and drinking  Eat a diet that includes fresh fruits and vegetables, whole grains, lean protein, and low-fat dairy products.  Take vitamin and mineral supplements as  recommended by your health care provider.  Do not drink alcohol if your health care provider tells you not to drink.  If you drink alcohol: ? Limit how much you have to 0-2 drinks a day. ? Be aware of how much alcohol is in your drink. In the [place] one drink equals one 12 oz bottle of beer (355 mL), one 5 oz glass of wine (148 mL), or one 1 oz glass of hard liquor (44 mL). Lifestyle  Take daily care of your teeth and gums.  Stay active. Exercise for at least 30 minutes on 5 or more days each week.  Do not use any products that contain nicotine or tobacco, such as cigarettes, e-cigarettes, and chewing tobacco. If you need help quitting, ask your health care provider.  If you are sexually active, practice safe sex. Use a condom or other form of protection to prevent STIs (sexually transmitted infections).  Talk with your health care provider about taking a low-dose aspirin every day starting at age 50. What's next?  Go to your health care provider once a year for a well check visit.  Ask your health care provider how often you should have your eyes and teeth checked.  Stay up to date on all vaccines. This information is not intended to replace advice given to you by your health care provider. Make sure you discuss any questions you have with your health care provider. Document Revised: 11/19/2018 Document Reviewed: 11/19/2018 Elsevier Patient Education  2020 [employer] Inc[employer]  

## 2020-11-23 LAB — HEMOGLOBIN A1C
Hgb A1c MFr Bld: 5.4 % of total Hgb (ref <5.7)
Mean Plasma Glucose: 108 mg/dL
eAG (mmol/L): 6 mmol/L

## 2020-12-21 ENCOUNTER — Other Ambulatory Visit: Payer: Self-pay | Admitting: Family Medicine

## 2021-01-23 ENCOUNTER — Inpatient Hospital Stay: Payer: BC Managed Care – PPO | Attending: Oncology | Admitting: Oncology

## 2021-01-23 ENCOUNTER — Inpatient Hospital Stay: Payer: BC Managed Care – PPO

## 2021-02-12 DIAGNOSIS — H40013 Open angle with borderline findings, low risk, bilateral: Secondary | ICD-10-CM | POA: Diagnosis not present

## 2021-02-20 ENCOUNTER — Telehealth: Payer: Self-pay | Admitting: Oncology

## 2021-02-20 NOTE — Telephone Encounter (Signed)
Called patient per 03/15 scheduled message, left a voicemail.

## 2021-02-22 ENCOUNTER — Inpatient Hospital Stay: Payer: BC Managed Care – PPO | Attending: Oncology

## 2021-02-22 ENCOUNTER — Inpatient Hospital Stay (HOSPITAL_BASED_OUTPATIENT_CLINIC_OR_DEPARTMENT_OTHER): Payer: BC Managed Care – PPO | Admitting: Oncology

## 2021-02-22 ENCOUNTER — Other Ambulatory Visit: Payer: Self-pay

## 2021-02-22 VITALS — BP 123/71 | HR 60 | Temp 97.5°F | Resp 13 | Ht 69.5 in | Wt 176.4 lb

## 2021-02-22 DIAGNOSIS — D696 Thrombocytopenia, unspecified: Secondary | ICD-10-CM

## 2021-02-22 LAB — CBC WITH DIFFERENTIAL (CANCER CENTER ONLY)
Abs Immature Granulocytes: 0.01 10*3/uL (ref 0.00–0.07)
Basophils Absolute: 0 10*3/uL (ref 0.0–0.1)
Basophils Relative: 1 %
Eosinophils Absolute: 0.2 10*3/uL (ref 0.0–0.5)
Eosinophils Relative: 3 %
HCT: 46.1 % (ref 39.0–52.0)
Hemoglobin: 15.1 g/dL (ref 13.0–17.0)
Immature Granulocytes: 0 %
Lymphocytes Relative: 39 %
Lymphs Abs: 2.2 10*3/uL (ref 0.7–4.0)
MCH: 27.6 pg (ref 26.0–34.0)
MCHC: 32.8 g/dL (ref 30.0–36.0)
MCV: 84.3 fL (ref 80.0–100.0)
Monocytes Absolute: 0.5 10*3/uL (ref 0.1–1.0)
Monocytes Relative: 9 %
Neutro Abs: 2.7 10*3/uL (ref 1.7–7.7)
Neutrophils Relative %: 48 %
Platelet Count: 61 10*3/uL — ABNORMAL LOW (ref 150–400)
RBC: 5.47 MIL/uL (ref 4.22–5.81)
RDW: 13.5 % (ref 11.5–15.5)
WBC Count: 5.6 10*3/uL (ref 4.0–10.5)
nRBC: 0 % (ref 0.0–0.2)

## 2021-02-22 NOTE — Progress Notes (Signed)
Hematology and Oncology Follow Up Visit  Joseph Lindsey [id number] 27-Jul-1971 49 y.o. 02/22/2021 3:13 PM  CC: Joseph Lindsey, M.D.    Principle Diagnosis: 50 year old man with thrombocytopenia diagnosed in 2011.  He has platelet count around 50,000 with differential diagnosis including versus chronic inherited condition.  Current therapy: Active surveillance.  Interim History:   Joseph Lindsey is here for return evaluation.  Since the last visit, he reports no major changes in his health.  Continues to be active and attends activities of daily living.  Denies any recent hospitalizations or illnesses.  He denies any hematochezia, melena or hemoptysis.  He denies any change in his medication currently.     Medications: Updated on review. Current Outpatient Medications  Medication Sig Dispense Refill  . fexofenadine (ALLEGRA) 180 MG tablet Take 180 mg by mouth daily.    Marland Kitchen levothyroxine (SYNTHROID) 100 MCG tablet TAKE 1 TABLET DAILY 90 tablet 3  . triamcinolone cream (KENALOG) 0.1 % APPLY TOPICALLY TWICE A DAY AS NEEDED 30 g 2   No current facility-administered medications for this visit.    Allergies: No Known Allergies     Physical Exam:    Blood pressure 123/71, pulse 60, temperature (!) 97.5 F (36.4 C), temperature source Tympanic, resp. rate 13, height 5' 9.5" (1.765 m), weight 176 lb 6.4 oz (80 kg), SpO2 100 %.      ECOG: 0    General appearance: Comfortable appearing without any discomfort Head: Normocephalic without any trauma Oropharynx: Mucous membranes are moist and pink without any thrush or ulcers. Eyes: Pupils are equal and round reactive to light. Lymph nodes: No cervical, supraclavicular, inguinal or axillary lymphadenopathy.   Heart:regular rate and rhythm.  S1 and S2 without leg edema. Lung: Clear without any rhonchi or wheezes.  No dullness to percussion. Abdomin: Soft, nontender, nondistended with good bowel sounds.  No  hepatosplenomegaly. Musculoskeletal: No joint deformity or effusion.  Full range of motion noted. Neurological: No deficits noted on motor, sensory and deep tendon reflex exam. Skin: No petechial rash or dryness.  Appeared moist.        Lab Results: Lab Results  Component Value Date   WBC 3.9 (L) 07/25/2020   HGB 15.4 07/25/2020   HCT 47.1 07/25/2020   MCV 82.8 07/25/2020   PLT 60 (L) 07/25/2020     Chemistry      Component Value Date/Time   NA 136 11/16/2019 0933   NA 140 08/17/2015 1114   K 3.6 11/16/2019 0933   K 4.1 08/17/2015 1114   CL 100 11/16/2019 0933   CL 106 12/15/2012 0948   CO2 29 11/16/2019 0933   CO2 26 08/17/2015 1114   BUN 12 11/16/2019 0933   BUN 10.3 08/17/2015 1114   CREATININE 1.07 11/16/2019 0933   CREATININE 1.2 08/17/2015 1114      Component Value Date/Time   CALCIUM 9.4 11/16/2019 0933   CALCIUM 9.4 08/17/2015 1114   ALKPHOS 61 11/16/2019 0933   ALKPHOS 67 08/17/2015 1114   AST 30 11/16/2019 0933   AST 24 08/17/2015 1114   ALT 24 11/16/2019 0933   ALT 30 08/17/2015 1114   BILITOT 0.7 11/16/2019 0933   BILITOT 0.60 08/17/2015 1114        Impression and plan:  50 year old man with:  1.  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    He is currently on active surveillance without any need for intervention at this time.  His platelet counts have  fluctuated above close to 50 of the last 10 years.  Laboratory data in August 2020 showed a platelet count of 60.   Laboratory data from today reviewed and continues to show a platelet count of 61 with normal white cell count and hemoglobin.  At this time, I recommended continued active surveillance without any intervention.  A trial of steroids or IVIG can be considered in the future if he has any bleeding or further drop in his counts.   2.  Covid vaccination considerations: Risks and benefits of vaccination were discussed today in detail.  He has  declined them in the past and we reiterated the benefits of today as well as the potential hematological implication including thrombocytopenia given his diagnosis.  Overall, he still feels mRNA vaccines are fairly safe and effective in preventing severe disease and I urged him to undergo vaccination in the near future.  We will consider it for the time being.  3.  Follow-up: In 6 months for repeat follow-up.   30  minutes were spent on this encounter.  Time was dedicated to reviewing disease status, discussing treatment options and outlining future plan of care review.    Joseph Hose, MD 3/17/20223:13 PM

## 2021-02-23 ENCOUNTER — Telehealth: Payer: Self-pay | Admitting: Oncology

## 2021-02-23 NOTE — Telephone Encounter (Signed)
Scheduled follow-up appointment per 3/17 los. Patient is aware. ?

## 2021-03-02 ENCOUNTER — Emergency Department (HOSPITAL_COMMUNITY): Payer: BC Managed Care – PPO

## 2021-03-02 ENCOUNTER — Emergency Department (HOSPITAL_COMMUNITY)
Admission: EM | Admit: 2021-03-02 | Discharge: 2021-03-02 | Disposition: A | Discharge status: Home / Self Care | Payer: BC Managed Care – PPO | Attending: Emergency Medicine | Admitting: Emergency Medicine

## 2021-03-02 ENCOUNTER — Other Ambulatory Visit: Payer: Self-pay

## 2021-03-02 ENCOUNTER — Encounter (HOSPITAL_COMMUNITY): Payer: Self-pay

## 2021-03-02 DIAGNOSIS — Z79899 Other long term (current) drug therapy: Secondary | ICD-10-CM | POA: Diagnosis not present

## 2021-03-02 DIAGNOSIS — E039 Hypothyroidism, unspecified: Secondary | ICD-10-CM | POA: Insufficient documentation

## 2021-03-02 DIAGNOSIS — S0990XA Unspecified injury of head, initial encounter: Secondary | ICD-10-CM | POA: Diagnosis not present

## 2021-03-02 DIAGNOSIS — Y92002 Bathroom of unspecified non-institutional (private) residence single-family (private) house as the place of occurrence of the external cause: Secondary | ICD-10-CM | POA: Insufficient documentation

## 2021-03-02 DIAGNOSIS — M25551 Pain in right hip: Secondary | ICD-10-CM | POA: Diagnosis not present

## 2021-03-02 DIAGNOSIS — S79911A Unspecified injury of right hip, initial encounter: Secondary | ICD-10-CM | POA: Diagnosis not present

## 2021-03-02 DIAGNOSIS — S0181XA Laceration without foreign body of other part of head, initial encounter: Secondary | ICD-10-CM | POA: Insufficient documentation

## 2021-03-02 DIAGNOSIS — R55 Syncope and collapse: Secondary | ICD-10-CM | POA: Insufficient documentation

## 2021-03-02 DIAGNOSIS — W01198A Fall on same level from slipping, tripping and stumbling with subsequent striking against other object, initial encounter: Secondary | ICD-10-CM | POA: Insufficient documentation

## 2021-03-02 LAB — BASIC METABOLIC PANEL
Anion gap: 5 (ref 5–15)
BUN: 14 mg/dL (ref 6–20)
CO2: 28 mmol/L (ref 22–32)
Calcium: 9.2 mg/dL (ref 8.9–10.3)
Chloride: 104 mmol/L (ref 98–111)
Creatinine, Ser: 0.99 mg/dL (ref 0.61–1.24)
GFR, Estimated: >60 mL/min (ref >60)
Glucose, Bld: 103 mg/dL — ABNORMAL HIGH (ref 70–99)
Potassium: 4.1 mmol/L (ref 3.5–5.1)
Sodium: 137 mmol/L (ref 135–145)

## 2021-03-02 LAB — URINALYSIS, ROUTINE W REFLEX MICROSCOPIC
Bilirubin Urine: NEGATIVE
Glucose, UA: NEGATIVE mg/dL
Hgb urine dipstick: NEGATIVE
Ketones, ur: NEGATIVE mg/dL
Leukocytes,Ua: NEGATIVE
Nitrite: NEGATIVE
Protein, ur: NEGATIVE mg/dL
Specific Gravity, Urine: 1.023 (ref 1.005–1.030)
pH: 6 (ref 5.0–8.0)

## 2021-03-02 LAB — CBC
HCT: 44.8 % (ref 39.0–52.0)
Hemoglobin: 15 g/dL (ref 13.0–17.0)
MCH: 28.3 pg (ref 26.0–34.0)
MCHC: 33.5 g/dL (ref 30.0–36.0)
MCV: 84.5 fL (ref 80.0–100.0)
Platelets: 54 10*3/uL — ABNORMAL LOW (ref 150–400)
RBC: 5.3 MIL/uL (ref 4.22–5.81)
RDW: 13.8 % (ref 11.5–15.5)
WBC: 5.5 10*3/uL (ref 4.0–10.5)
nRBC: 0 % (ref 0.0–0.2)

## 2021-03-02 NOTE — ED Provider Notes (Signed)
[facility] EMERGENCY DEPARTMENT Provider Note   CSN: [id number] Arrival date & time: 03/02/21  0247     History Chief Complaint  Patient presents with  . Loss of Consciousness  . Head Injury    Joseph Lindsey is a 50 y.o. male.  Patient presents to the emergency department for evaluation after syncopal episode.  Patient reports that he was laying on the couch, dozing when he felt like he needed to urinate.  He got up, went to the bathroom, urinated and then became very weak and passed out.  He hit his head and right hip area.        Past Medical History:  Diagnosis Date  . Fissure, anal    2003  . Migraines   . Primary thrombocytopenia, unspecified   . Thyroid disease    hypothyroid    Patient Active Problem List   Diagnosis Date Noted  . Thrombocytopenia (HCC) 05/31/2010  . TRANSAMINASES, SERUM, ELEVATED 05/31/2010  . Hypothyroidism 05/10/2010  . NECK PAIN, LEFT 05/10/2010    Past Surgical History:  Procedure Laterality Date  . BREATH TEK H PYLORI N/A 11/11/2013   Procedure: BREATH TEK H PYLORI;  Surgeon: Beverley Fiedler, MD;  Location: [facility];  Service: Gastroenterology;  Laterality: N/A;       Family History  Problem Relation Age of Onset  . Hypertension Father   . Diabetes Father   . Asthma Father   . Diabetes Sister   . Asthma Sister   . Asthma Brother   . Cancer Brother 34       gastric cancer  . Stomach cancer Brother   . Sickle cell trait Other        mother  . Colon cancer Neg Hx   . Esophageal cancer Neg Hx   . Rectal cancer Neg Hx     Social History   Tobacco Use  . Smoking status: Never Smoker  . Smokeless tobacco: Never Used  Vaping Use  . Vaping Use: Never used  Substance Use Topics  . Alcohol use: Yes    Comment: once a week  . Drug use: No    Home Medications Prior to Admission medications   Medication Sig Start Date End Date Taking? Authorizing Provider  fexofenadine (ALLEGRA) 180 MG tablet  Take 180 mg by mouth daily.    [provider]  levothyroxine (SYNTHROID) 100 MCG tablet TAKE 1 TABLET DAILY 12/21/20   Burchette, Elberta Fortis, MD  triamcinolone cream (KENALOG) 0.1 % APPLY TOPICALLY TWICE A DAY AS NEEDED 02/11/20   Burchette, Elberta Fortis, MD    Allergies    Patient has no known allergies.  Review of Systems   Review of Systems  Skin: Positive for wound.  Neurological: Positive for syncope.  All other systems reviewed and are negative.   Physical Exam Updated Vital Signs BP 121/62   Pulse (!) 58   Temp 99.3 F (37.4 C) (Oral)   Resp 18   Ht 5\' 9"  (1.753 m)   Wt 80 kg   SpO2 100%   BMI 26.05 kg/m   Physical Exam Vitals and nursing note reviewed.  Constitutional:      General: He is not in acute distress.    Appearance: Normal appearance. He is well-developed.  HENT:     Head: Normocephalic. Laceration (forehead) present.     Right Ear: Hearing normal.     Left Ear: Hearing normal.     Nose: Nose normal.  Eyes:  Conjunctiva/sclera: Conjunctivae normal.     Pupils: Pupils are equal, round, and reactive to light.  Cardiovascular:     Rate and Rhythm: Regular rhythm.     Heart sounds: S1 normal and S2 normal. No murmur heard. No friction rub. No gallop.   Pulmonary:     Effort: Pulmonary effort is normal. No respiratory distress.     Breath sounds: Normal breath sounds.  Chest:     Chest wall: No tenderness.  Abdominal:     General: Bowel sounds are normal.     Palpations: Abdomen is soft.     Tenderness: There is no abdominal tenderness. There is no guarding or rebound. Negative signs include Murphy's sign and McBurney's sign.     Hernia: No hernia is present.  Musculoskeletal:        General: Normal range of motion.     Cervical back: Normal range of motion and neck supple.     Right hip: Tenderness (ASIS) present. No deformity. Normal range of motion.       Legs:  Skin:    General: Skin is warm and dry.     Findings: Laceration (forehead)  present. No rash.  Neurological:     Mental Status: He is alert and oriented to person, place, and time.     GCS: GCS eye subscore is 4. GCS verbal subscore is 5. GCS motor subscore is 6.     Cranial Nerves: No cranial nerve deficit.     Sensory: No sensory deficit.     Coordination: Coordination normal.  Psychiatric:        Speech: Speech normal.        Behavior: Behavior normal.        Thought Content: Thought content normal.     ED Results / Procedures / Treatments   Labs (all labs ordered are listed, but only abnormal results are displayed) Labs Reviewed  BASIC METABOLIC PANEL - Abnormal; Notable for the following components:      Result Value   Glucose, Bld 103 (*)    All other components within normal limits  CBC - Abnormal; Notable for the following components:   Platelets 54 (*)    All other components within normal limits  URINALYSIS, ROUTINE W REFLEX MICROSCOPIC  CBG MONITORING, ED    EKG EKG Interpretation  Date/Time:  Friday March 02 2021 02:58:42 EDT Ventricular Rate:  59 PR Interval:  174 QRS Duration: 94 QT Interval:  390 QTC Calculation: 386 R Axis:   80 Text Interpretation: Sinus bradycardia Minimal voltage criteria for LVH, may be normal variant ( Sokolow-Lyon ) Borderline ECG Confirmed by Gilda Crease [phone]) on 03/02/2021 7:02:03 AM   Radiology CT HEAD WO CONTRAST  Result Date: 03/02/2021 CLINICAL DATA:  Syncope with head injury EXAM: CT HEAD WITHOUT CONTRAST TECHNIQUE: Contiguous axial images were obtained from the base of the skull through the vertex without intravenous contrast. COMPARISON:  01/23/2016 FINDINGS: Brain: No evidence of swelling, infarction, hemorrhage, hydrocephalus, extra-axial collection or mass lesion/mass effect. Vascular: No hyperdense vessel or unexpected calcification. Skull: Normal. Negative for fracture or focal lesion. Sinuses/Orbits: No acute finding. IMPRESSION: Negative head CT Electronically Signed   By:  Marnee Spring M.D.   On: 03/02/2021 05:10   DG Hip Unilat W or Wo Pelvis 2-3 Views Right  Result Date: 03/02/2021 CLINICAL DATA:  Fall with head injury. EXAM: DG HIP (WITH OR WITHOUT PELVIS) 2-3V RIGHT COMPARISON:  None. FINDINGS: There is no evidence of hip fracture or dislocation.  No degenerative hip narrowing. IMPRESSION: Negative. Electronically Signed   By: Marnee Spring M.D.   On: 03/02/2021 05:08    Procedures .Marland KitchenLaceration Repair  Date/Time: 03/02/2021 7:59 AM Performed by: Gilda Crease, MD Authorized by: Gilda Crease, MD   Consent:    Consent obtained:  Verbal   Consent given by:  Patient   Risks, benefits, and alternatives were discussed: yes     Risks discussed:  Infection and poor cosmetic result Universal protocol:    Procedure explained and questions answered to patient or proxy's satisfaction: yes     Relevant documents present and verified: yes     Test results available: yes     Imaging studies available: yes     Required blood products, implants, devices, and special equipment available: yes     Site/side marked: yes     Immediately prior to procedure, a time out was called: yes     Patient identity confirmed:  Verbally with patient Anesthesia:    Anesthesia method:  None Laceration details:    Location:  Face   Face location:  Forehead   Length (cm):  2.5 Pre-procedure details:    Preparation:  Patient was prepped and draped in usual sterile fashion and imaging obtained to evaluate for foreign bodies Exploration:    Contaminated: no   Treatment:    Area cleansed with:  Chlorhexidine   Amount of cleaning:  Standard Skin repair:    Repair method:  Tissue adhesive Repair type:    Repair type:  Simple Post-procedure details:    Dressing:  Open (no dressing)     Medications Ordered in ED Medications - No data to display  ED Course  I have reviewed the triage vital signs and the nursing notes.  Pertinent labs & imaging results  that were available during my care of the patient were reviewed by me and considered in my medical decision making (see chart for details).    MDM Rules/Calculators/A&P                          Patient presents to the emergency department for evaluation after syncopal episode.  Patient got up, went to the bathroom, urinated and then felt weak, dizzy and passed out.  Patient without any cardiac symptoms.  Cup unremarkable.  No signs of infection.  All lab work and vital signs normal here in the department.  Patient reassured, no further work-up necessary.  Superficial laceration of forehead closed with Dermabond.  Skin tears on hip dressed by nursing staff. Patient without any red flags for syncope that would require hospitalization or further work-up.  Final Clinical Impression(s) / ED Diagnoses Final diagnoses:  Syncope, unspecified syncope type    Rx / DC Orders ED Discharge Orders    None       Lumir Demetriou, Canary Brim, MD 03/02/21 [phone]

## 2021-03-02 NOTE — ED Triage Notes (Signed)
Had syncopal episode and fell and hit head after getting up and going to the bathroom. [id number]. Denies use of blood thinners.

## 2021-03-05 ENCOUNTER — Other Ambulatory Visit: Payer: Self-pay

## 2021-03-05 ENCOUNTER — Ambulatory Visit (INDEPENDENT_AMBULATORY_CARE_PROVIDER_SITE_OTHER): Payer: BC Managed Care – PPO | Admitting: Family Medicine

## 2021-03-05 ENCOUNTER — Encounter: Payer: Self-pay | Admitting: Family Medicine

## 2021-03-05 VITALS — BP 120/62 | HR 52 | Temp 97.9°F | Wt 171.4 lb

## 2021-03-05 DIAGNOSIS — R55 Syncope and collapse: Secondary | ICD-10-CM | POA: Diagnosis not present

## 2021-03-05 DIAGNOSIS — E039 Hypothyroidism, unspecified: Secondary | ICD-10-CM | POA: Diagnosis not present

## 2021-03-05 DIAGNOSIS — S70211A Abrasion, right hip, initial encounter: Secondary | ICD-10-CM | POA: Diagnosis not present

## 2021-03-05 LAB — TSH: TSH: 0.57 u[IU]/mL (ref 0.35–4.50)

## 2021-03-05 NOTE — Patient Instructions (Signed)
Syncope  Syncope refers to a condition in which a person temporarily loses consciousness. Syncope may also be called fainting or passing out. It is caused by a sudden decrease in blood flow to the brain. Even though most causes of syncope are not dangerous, syncope can be a sign of a serious medical problem. Your health care provider may do tests to find the reason why you are having syncope. Signs that you may be about to faint include:  Feeling dizzy or light-headed.  Feeling nauseous.  Seeing all white or all black in your field of vision.  Having cold, clammy skin. If you faint, get medical help right away. Call your local emergency services (911 in the [place] Do not drive yourself to the hospital. Follow these instructions at home: Pay attention to any changes in your symptoms. Take these actions to stay safe and to help relieve your symptoms: Lifestyle  Do not drive, use machinery, or play sports until your health care provider says it is okay.  Do not drink alcohol.  Do not use any products that contain nicotine or tobacco, such as cigarettes and e-cigarettes. If you need help quitting, ask your health care provider.  Drink enough fluid to keep your urine pale yellow. General instructions  Take over-the-counter and prescription medicines only as told by your health care provider.  If you are taking blood pressure or heart medicine, get up slowly and take several minutes to sit and then stand. This can reduce dizziness or light-headedness.  Have someone stay with you until you feel stable.  If you start to feel like you might faint, lie down right away and raise (elevate) your feet above the level of your heart. Breathe deeply and steadily. Wait until all the symptoms have passed.  Keep all follow-up visits as told by your health care provider. This is important. Get help right away if you:  Have a severe headache.  Faint once or repeatedly.  Have pain in your chest,  abdomen, or back.  Have a very fast or irregular heartbeat (palpitations).  Have pain when you breathe.  Are bleeding from your mouth or rectum, or you have black or tarry stool.  Have a seizure.  Are confused.  Have trouble walking.  Have severe weakness.  Have vision problems. These symptoms may represent a serious problem that is an emergency. Do not wait to see if your symptoms will go away. Get medical help right away. Call your local emergency services (911 in the [place] Do not drive yourself to the hospital. Summary  Syncope refers to a condition in which a person temporarily loses consciousness. It is caused by a sudden decrease in blood flow to the brain.  Signs that you may be about to faint include dizziness, feeling light-headed, feeling nauseous, sudden vision changes, or cold, clammy skin.  Although most causes of syncope are not dangerous, syncope can be a sign of a serious medical problem. If you faint, get medical help right away. This information is not intended to replace advice given to you by your health care provider. Make sure you discuss any questions you have with your health care provider. Document Revised: 04/06/2020 Document Reviewed: 04/06/2020 Elsevier Patient Education  2021 Elsevier Inc.  

## 2021-03-05 NOTE — Progress Notes (Signed)
Established Patient Office Visit  Subjective:  Patient ID: Joseph Lindsey, male    DOB: [birth year]  Age: 50 y.o. MRN: [id number]  CC:  Chief Complaint  Patient presents with  . Hospitalization Follow-up    HPI [name] presents for ER follow-up from recent syncope episode.  He states that he was laying on the couch and dozed off and had to get up to urinate.  He went to the bathroom he felt lightheaded and generalized weakness and then had sudden syncopal episode.  He struck his frontal region with a laceration and also right anterior hip region.  He was immediately aware of his surroundings and he came back around.  No witnessed seizure activity.  No headache.  No confusion.  Went to the ER and had work-up which included EKG which showed mild sinus bradycardia with heart rate around 59.  No other major abnormality.  CT head no acute findings.  He had x-ray of the hip and pelvis which showed no fracture.  He had lab work including CBC, chemistries, urinalysis which is unremarkable except for his chronic thrombocytopenia.  He has had no dizziness since then.  No prior history of syncope.  No recent chest pains.  He has hypothyroidism and is on replacement.  He has not had recent TSH.  Past Medical History:  Diagnosis Date  . Fissure, anal    2003  . Migraines   . Primary thrombocytopenia, unspecified   . Thyroid disease    hypothyroid    Past Surgical History:  Procedure Laterality Date  . BREATH TEK H PYLORI N/A 11/11/2013   Procedure: BREATH TEK H PYLORI;  Surgeon: Jerene Bears, MD;  Location: [facility];  Service: Gastroenterology;  Laterality: N/A;    Family History  Problem Relation Age of Onset  . Hypertension Father   . Diabetes Father   . Asthma Father   . Diabetes Sister   . Asthma Sister   . Asthma Brother   . Cancer Brother 24       gastric cancer  . Stomach cancer Brother   . Sickle cell trait Other        mother  . Colon cancer Neg Hx   .  Esophageal cancer Neg Hx   . Rectal cancer Neg Hx     Social History   Socioeconomic History  . Marital status: Married    Spouse name: Not on file  . Number of children: 1  . Years of education: Not on file  . Highest education level: Not on file  Occupational History    Employer: [employer]  Tobacco Use  . Smoking status: Never Smoker  . Smokeless tobacco: Never Used  Vaping Use  . Vaping Use: Never used  Substance and Sexual Activity  . Alcohol use: Yes    Comment: once a week  . Drug use: No  . Sexual activity: Not on file  Other Topics Concern  . Not on file  Social History Narrative  . Not on file   Social Determinants of Health   Financial Resource Strain: Not on file  Food Insecurity: Not on file  Transportation Needs: Not on file  Physical Activity: Not on file  Stress: Not on file  Social Connections: Not on file  Intimate Partner Violence: Not on file    Outpatient Medications Prior to Visit  Medication Sig Dispense Refill  . fexofenadine (ALLEGRA) 180 MG tablet Take 180 mg by mouth daily.    Marland Kitchen  levothyroxine (SYNTHROID) 100 MCG tablet TAKE 1 TABLET DAILY 90 tablet 3  . triamcinolone cream (KENALOG) 0.1 % APPLY TOPICALLY TWICE A DAY AS NEEDED 30 g 2   No facility-administered medications prior to visit.    No Known Allergies  ROS Review of Systems  Constitutional: Negative for chills, fatigue, fever and unexpected weight change.  Eyes: Negative for visual disturbance.  Respiratory: Negative for cough, chest tightness and shortness of breath.   Cardiovascular: Negative for chest pain, palpitations and leg swelling.  Neurological: Negative for weakness, light-headedness and headaches.       See HPI      Objective:    Physical Exam Vitals reviewed.  Constitutional:      Appearance: Normal appearance.  HENT:     Head:     Comments: He has Dermabond over wound mid forehead.  Laceration appears to be healing well.  No signs of  secondary infection.  No erythema.  No drainage.  No significant swelling. Cardiovascular:     Rate and Rhythm: Normal rate and regular rhythm.  Pulmonary:     Effort: Pulmonary effort is normal.     Breath sounds: Normal breath sounds.  Musculoskeletal:     Cervical back: Neck supple.     Comments: He has 2 superficial abrasions over the right anterior hip.  No signs of secondary infection  Neurological:     General: No focal deficit present.     Mental Status: He is alert and oriented to person, place, and time.     Cranial Nerves: No cranial nerve deficit.     Motor: No weakness.     BP 120/62 (BP Location: Left Arm, Patient Position: Sitting, Cuff Size: Normal)   Pulse (!) 52   Temp 97.9 F (36.6 C) (Oral)   Wt 171 lb 6.4 oz (77.7 kg)   SpO2 98%   BMI 25.31 kg/m  Wt Readings from Last 3 Encounters:  03/05/21 171 lb 6.4 oz (77.7 kg)  03/02/21 176 lb 5.9 oz (80 kg)  02/22/21 176 lb 6.4 oz (80 kg)     There are no preventive care reminders to display for this patient.  There are no preventive care reminders to display for this patient.  Lab Results  Component Value Date   TSH 2.02 11/16/2019   Lab Results  Component Value Date   WBC 5.5 03/02/2021   HGB 15.0 03/02/2021   HCT 44.8 03/02/2021   MCV 84.5 03/02/2021   PLT 54 (L) 03/02/2021   Lab Results  Component Value Date   NA 137 03/02/2021   K 4.1 03/02/2021   CHLORIDE 107 08/17/2015   CO2 28 03/02/2021   GLUCOSE 103 (H) 03/02/2021   BUN 14 03/02/2021   CREATININE 0.99 03/02/2021   BILITOT 0.7 11/16/2019   ALKPHOS 61 11/16/2019   AST 30 11/16/2019   ALT 24 11/16/2019   PROT 6.7 11/16/2019   ALBUMIN 4.2 11/16/2019   CALCIUM 9.2 03/02/2021   ANIONGAP 5 03/02/2021   EGFR 88 (L) 08/17/2015   GFR 89.40 11/16/2019   Lab Results  Component Value Date   CHOL 217 (H) 11/16/2019   Lab Results  Component Value Date   HDL 54.30 11/16/2019   Lab Results  Component Value Date   LDLCALC 153 (H)  11/16/2019   Lab Results  Component Value Date   TRIG 50.0 11/16/2019   Lab Results  Component Value Date   CHOLHDL 4 11/16/2019   Lab Results  Component Value Date  HGBA1C 5.4 11/22/2020      Assessment & Plan:   #1 recent syncopal episode.  This sounded like classic vasovagal syncope.  This occurred in the setting of urination.  He has had no symptoms of dizziness or syncope since then.  Work-up in the ER was reviewed and unrevealing. -We suggested that he sit down to urinate for the next week and then with great caution eventually can go back to his normal routine.  Follow-up immediately for any recurrent episodes.  #2 right anterior hip abrasion and laceration of the frontal scalp from the fall.  Wounds appear to be healing well  #3 hypothyroidism.  He is overdue for follow-up labs  -Check TSH.  Continue current dose of levothyroxine unless indicated by labs  No orders of the defined types were placed in this encounter.   Follow-up: No follow-ups on file.    Carolann Littler, MD

## 2021-03-21 ENCOUNTER — Other Ambulatory Visit: Payer: Self-pay | Admitting: Family Medicine

## 2021-04-09 DIAGNOSIS — Z20822 Contact with and (suspected) exposure to covid-19: Secondary | ICD-10-CM | POA: Diagnosis not present

## 2021-08-22 ENCOUNTER — Inpatient Hospital Stay: Payer: BC Managed Care – PPO | Attending: Oncology | Admitting: Oncology

## 2021-08-22 ENCOUNTER — Inpatient Hospital Stay: Payer: BC Managed Care – PPO

## 2021-08-22 ENCOUNTER — Other Ambulatory Visit: Payer: Self-pay

## 2021-08-22 VITALS — BP 129/68 | HR 47 | Temp 97.1°F | Resp 18 | Wt 166.2 lb

## 2021-08-22 DIAGNOSIS — R55 Syncope and collapse: Secondary | ICD-10-CM | POA: Insufficient documentation

## 2021-08-22 DIAGNOSIS — D696 Thrombocytopenia, unspecified: Secondary | ICD-10-CM

## 2021-08-22 LAB — CBC WITH DIFFERENTIAL (CANCER CENTER ONLY)
Abs Immature Granulocytes: 0.01 10*3/uL (ref 0.00–0.07)
Basophils Absolute: 0 10*3/uL (ref 0.0–0.1)
Basophils Relative: 1 %
Eosinophils Absolute: 0.1 10*3/uL (ref 0.0–0.5)
Eosinophils Relative: 3 %
HCT: 46.8 % (ref 39.0–52.0)
Hemoglobin: 15.3 g/dL (ref 13.0–17.0)
Immature Granulocytes: 0 %
Lymphocytes Relative: 37 %
Lymphs Abs: 1.7 10*3/uL (ref 0.7–4.0)
MCH: 27.5 pg (ref 26.0–34.0)
MCHC: 32.7 g/dL (ref 30.0–36.0)
MCV: 84.2 fL (ref 80.0–100.0)
Monocytes Absolute: 0.6 10*3/uL (ref 0.1–1.0)
Monocytes Relative: 13 %
Neutro Abs: 2.1 10*3/uL (ref 1.7–7.7)
Neutrophils Relative %: 46 %
Platelet Count: 62 10*3/uL — ABNORMAL LOW (ref 150–400)
RBC: 5.56 MIL/uL (ref 4.22–5.81)
RDW: 13.8 % (ref 11.5–15.5)
WBC Count: 4.5 10*3/uL (ref 4.0–10.5)
nRBC: 0 % (ref 0.0–0.2)

## 2021-08-22 NOTE — Progress Notes (Signed)
Hematology and Oncology Follow Up Visit  Joseph Lindsey [id number] [birth year] 50 y.o. 08/22/2021 8:03 AM  CC: Joseph Lindsey, M.D.    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   Current therapy: Active surveillance.  Interim History:   Joseph Lindsey presents today for a follow-up visit.  Since last visit, he was seen in the emergency department in March 2022 after developed a vasovagal syncope and a head laceration.  The laceration did not bleed extensively.  Since that time, he reports of feeling well without any major complaints or any episodes.  He denies any active bleeding or bruising.     Medications: Reviewed without changes. Current Outpatient Medications  Medication Sig Dispense Refill   fexofenadine (ALLEGRA) 180 MG tablet Take 180 mg by mouth daily.     levothyroxine (SYNTHROID) 100 MCG tablet TAKE 1 TABLET DAILY 90 tablet 3   triamcinolone cream (KENALOG) 0.1 % APPLY TOPICALLY TWICE A DAY AS NEEDED 30 g 3   No current facility-administered medications for this visit.    Allergies: No Known Allergies     Physical Exam:       Blood pressure 129/68, pulse (!) 47, temperature (!) 97.1 F (36.2 C), temperature source Oral, resp. rate 18, weight 166 lb 3 oz (75.4 kg), SpO2 100 %.    ECOG: 0    General appearance: Alert, awake without any distress. Head: Atraumatic without abnormalities Oropharynx: Without any thrush or ulcers. Eyes: No scleral icterus. Lymph nodes: No lymphadenopathy noted in the cervical, supraclavicular, or axillary nodes Heart:regular rate and rhythm, without any murmurs or gallops.   Lung: Clear to auscultation without any rhonchi, wheezes or dullness to percussion. Abdomin: Soft, nontender without any shifting dullness or ascites. Musculoskeletal: No clubbing or cyanosis. Neurological: No motor or sensory deficits. Skin: No rashes or  lesions.        Lab Results: Lab Results  Component Value Date   WBC 5.5 03/02/2021   HGB 15.0 03/02/2021   HCT 44.8 03/02/2021   MCV 84.5 03/02/2021   PLT 54 (L) 03/02/2021     Chemistry      Component Value Date/Time   NA 137 03/02/2021 0308   NA 140 08/17/2015 1114   K 4.1 03/02/2021 0308   K 4.1 08/17/2015 1114   CL 104 03/02/2021 0308   CL 106 12/15/2012 0948   CO2 28 03/02/2021 0308   CO2 26 08/17/2015 1114   BUN 14 03/02/2021 0308   BUN 10.3 08/17/2015 1114   CREATININE 0.99 03/02/2021 0308   CREATININE 1.2 08/17/2015 1114      Component Value Date/Time   CALCIUM 9.2 03/02/2021 0308   CALCIUM 9.4 08/17/2015 1114   ALKPHOS 61 11/16/2019 0933   ALKPHOS 67 08/17/2015 1114   AST 30 11/16/2019 0933   AST 24 08/17/2015 1114   ALT 24 11/16/2019 0933   ALT 30 08/17/2015 1114   BILITOT 0.7 11/16/2019 0933   BILITOT 0.60 08/17/2015 1114        Impression and plan:  50 year old man with:  1.  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   His disease status was updated at this time and laboratory data were reviewed.  Laboratory data also reviewed in the last 10 years with platelet count that has fluctuated between 50 and 60 without any active bleeding.  His peripheral smear was reviewed on multiple occasions which showed large platelets.  His hemoglobin and white cells as well as  differential always has been normal.  I see no evidence to suggest myelodysplastic syndrome or infiltrative bone marrow process.  Management options were discussed at this time.  Continued observation is recommended and trial of steroids could be used if he develop lower counts or active bleeding.  Laboratory data from today reviewed and showed normal white cell count and hemoglobin.  His platelet count is consistent with his baseline.   2.  Vasovagal syncope: Exacerbated by micturition likely dehydration.  We have discussed strategies to mitigate this in the future  getting adequate hydration and avoiding sudden postural changes.  3.  Follow-up: He will return in 6 months for repeat evaluation.   30  minutes were dedicated to this visit.  Time was spent on reviewing laboratory data, disease status update, treatment choices and future plan of care discussion.    Eli Hose, MD 9/14/20228:03 AM

## 2021-08-31 ENCOUNTER — Telehealth: Payer: Self-pay | Admitting: Oncology

## 2021-08-31 NOTE — Telephone Encounter (Signed)
Scheduled per 09/14 los, patient has been called and voicemail was left. 

## 2021-11-26 ENCOUNTER — Encounter: Payer: BC Managed Care – PPO | Admitting: Family Medicine

## 2021-12-05 ENCOUNTER — Telehealth: Payer: Self-pay

## 2021-12-05 ENCOUNTER — Encounter: Payer: Self-pay | Admitting: Family Medicine

## 2021-12-05 ENCOUNTER — Ambulatory Visit (INDEPENDENT_AMBULATORY_CARE_PROVIDER_SITE_OTHER): Payer: BC Managed Care – PPO | Admitting: Family Medicine

## 2021-12-05 VITALS — BP 116/62 | HR 60 | Temp 98.0°F | Ht 69.0 in | Wt 169.0 lb

## 2021-12-05 DIAGNOSIS — Z0000 Encounter for general adult medical examination without abnormal findings: Secondary | ICD-10-CM | POA: Diagnosis not present

## 2021-12-05 DIAGNOSIS — Z125 Encounter for screening for malignant neoplasm of prostate: Secondary | ICD-10-CM | POA: Diagnosis not present

## 2021-12-05 LAB — LIPID PANEL
Cholesterol: 223 mg/dL — ABNORMAL HIGH (ref 0–200)
HDL: 57.2 mg/dL (ref >39.00)
LDL Cholesterol: 151 mg/dL — ABNORMAL HIGH (ref 0–99)
NonHDL: 165.59
Total CHOL/HDL Ratio: 4
Triglycerides: 73 mg/dL (ref 0.0–149.0)
VLDL: 14.6 mg/dL (ref 0.0–40.0)

## 2021-12-05 LAB — CBC WITH DIFFERENTIAL/PLATELET
Basophils Absolute: 0 10*3/uL (ref 0.0–0.1)
Basophils Relative: 0.6 % (ref 0.0–3.0)
Eosinophils Absolute: 0.1 10*3/uL (ref 0.0–0.7)
Eosinophils Relative: 1.7 % (ref 0.0–5.0)
HCT: 47.6 % (ref 39.0–52.0)
Hemoglobin: 15.6 g/dL (ref 13.0–17.0)
Lymphocytes Relative: 33.4 % (ref 12.0–46.0)
Lymphs Abs: 1.8 10*3/uL (ref 0.7–4.0)
MCHC: 32.8 g/dL (ref 30.0–36.0)
MCV: 85 fl (ref 78.0–100.0)
Monocytes Absolute: 0.6 10*3/uL (ref 0.1–1.0)
Monocytes Relative: 12 % (ref 3.0–12.0)
Neutro Abs: 2.8 10*3/uL (ref 1.4–7.7)
Neutrophils Relative %: 52.3 % (ref 43.0–77.0)
Platelets: 41 10*3/uL — CL (ref 150.0–400.0)
RBC: 5.6 Mil/uL (ref 4.22–5.81)
RDW: 14.7 % (ref 11.5–15.5)
WBC: 5.3 10*3/uL (ref 4.0–10.5)

## 2021-12-05 LAB — URINALYSIS
Bilirubin Urine: NEGATIVE
Hgb urine dipstick: NEGATIVE
Ketones, ur: NEGATIVE
Leukocytes,Ua: NEGATIVE
Nitrite: NEGATIVE
Specific Gravity, Urine: 1.01 (ref 1.000–1.030)
Total Protein, Urine: NEGATIVE
Urine Glucose: NEGATIVE
Urobilinogen, UA: 0.2 (ref 0.0–1.0)
pH: 7 (ref 5.0–8.0)

## 2021-12-05 LAB — BASIC METABOLIC PANEL
BUN: 11 mg/dL (ref 6–23)
CO2: 29 mEq/L (ref 19–32)
Calcium: 9.3 mg/dL (ref 8.4–10.5)
Chloride: 101 mEq/L (ref 96–112)
Creatinine, Ser: 1.09 mg/dL (ref 0.40–1.50)
GFR: 79.6 mL/min (ref >60.00)
Glucose, Bld: 87 mg/dL (ref 70–99)
Potassium: 4.3 mEq/L (ref 3.5–5.1)
Sodium: 137 mEq/L (ref 135–145)

## 2021-12-05 LAB — HEPATIC FUNCTION PANEL
ALT: 37 U/L (ref 0–53)
AST: 39 U/L — ABNORMAL HIGH (ref 0–37)
Albumin: 4.2 g/dL (ref 3.5–5.2)
Alkaline Phosphatase: 69 U/L (ref 39–117)
Bilirubin, Direct: 0.1 mg/dL (ref 0.0–0.3)
Total Bilirubin: 0.5 mg/dL (ref 0.2–1.2)
Total Protein: 7 g/dL (ref 6.0–8.3)

## 2021-12-05 LAB — PSA: PSA: 0.41 ng/mL (ref 0.10–4.00)

## 2021-12-05 LAB — HEMOGLOBIN A1C: Hgb A1c MFr Bld: 5.5 % (ref 4.6–6.5)

## 2021-12-05 LAB — TSH: TSH: 1.03 u[IU]/mL (ref 0.35–5.50)

## 2021-12-05 NOTE — Telephone Encounter (Signed)
CRITICAL VALUE STICKER  CRITICAL VALUE:  platelet count is 41,000  RECEIVER (on-site recipient of call): received teams from [place]   DATE & TIME NOTIFIED: 12/05/2021 at 12:01  MESSENGER (representative from lab):  MD NOTIFIED:  Verbal information given to Dr. Caryl Never that message being sent   TIME OF NOTIFICATION: 12:05  RESPONSE:  Will review message once received and let know if any action needed

## 2021-12-05 NOTE — Progress Notes (Signed)
Established Patient Office Visit  Subjective:  Patient ID: Joseph Lindsey, male    DOB: 02-22-1971  Age: 50 y.o. MRN: [id number]  CC: No chief complaint on file.   HPI Joseph Lindsey presents for annual physical exam.  He has past history significant for hypothyroidism and chronic thrombocytopenia.  He is followed by hematologist for his thrombocytopenia.  He takes levothyroxine for his thyroid. Generally doing well.  He hopes to take up tennis soon as a hobby and for regular exercise.  Very strong family history of type 2 diabetes as below.   Health maintenance reviewed:  -Declines flu vaccines -Prior hepatitis C screen negative. -Tetanus due 2031 -Colonoscopy due 2024  Family history significant for father with hypertension diabetes.  Sister with diabetes.  Brother had gastric cancer age 42   Social history-he is married.  Non-smoker.  No alcohol use. One child.  Works for [employer] Research officer, trade union.      Past Medical History:  Diagnosis Date   Fissure, anal    2003   Migraines    Primary thrombocytopenia, unspecified    Thyroid disease    hypothyroid    Past Surgical History:  Procedure Laterality Date   BREATH TEK H PYLORI N/A 11/11/2013   Procedure: BREATH TEK [name];  Surgeon: Jerene Bears, MD;  Location: [facility];  Service: Gastroenterology;  Laterality: N/A;    Family History  Problem Relation Age of Onset   Hypertension Father    Diabetes Father    Asthma Father    Diabetes Sister    Asthma Sister    Asthma Brother    Cancer Brother 39       gastric cancer   Stomach cancer Brother    Sickle cell trait Other        mother   Colon cancer Neg Hx    Esophageal cancer Neg Hx    Rectal cancer Neg Hx     Social History   Socioeconomic History   Marital status: Married    Spouse name: Not on file   Number of children: 1   Years of education: Not on file   Highest education level: Not on file  Occupational History     Employer: [employer]  Tobacco Use   Smoking status: Never   Smokeless tobacco: Never  Vaping Use   Vaping Use: Never used  Substance and Sexual Activity   Alcohol use: Yes    Comment: once a week   Drug use: No   Sexual activity: Not on file  Other Topics Concern   Not on file  Social History Narrative   Not on file   Social Determinants of Health   Financial Resource Strain: Not on file  Food Insecurity: Not on file  Transportation Needs: Not on file  Physical Activity: Not on file  Stress: Not on file  Social Connections: Not on file  Intimate Partner Violence: Not on file    Outpatient Medications Prior to Visit  Medication Sig Dispense Refill   fexofenadine (ALLEGRA) 180 MG tablet Take 180 mg by mouth daily.     levothyroxine (SYNTHROID) 100 MCG tablet TAKE 1 TABLET DAILY 90 tablet 3   triamcinolone cream (KENALOG) 0.1 % APPLY TOPICALLY TWICE A DAY AS NEEDED 30 g 3   No facility-administered medications prior to visit.    No Known Allergies  ROS Review of Systems  Constitutional:  Negative for activity change, appetite change, fatigue and fever.  HENT:  Negative  for congestion, ear pain and trouble swallowing.   Eyes:  Negative for pain and visual disturbance.  Respiratory:  Negative for cough, shortness of breath and wheezing.   Cardiovascular:  Negative for chest pain and palpitations.  Gastrointestinal:  Negative for abdominal distention, abdominal pain, blood in stool, constipation, diarrhea, nausea, rectal pain and vomiting.  Endocrine: Negative for polydipsia and polyuria.  Genitourinary:  Negative for dysuria, hematuria and testicular pain.  Musculoskeletal:  Negative for arthralgias and joint swelling.  Skin:  Negative for rash.  Neurological:  Negative for dizziness, syncope and headaches.  Hematological:  Negative for adenopathy.  Psychiatric/Behavioral:  Negative for confusion and dysphoric mood.      Objective:    Physical  Exam Constitutional:      General: He is not in acute distress.    Appearance: He is well-developed.  HENT:     Head: Normocephalic and atraumatic.     Right Ear: External ear normal.     Left Ear: External ear normal.  Eyes:     Conjunctiva/sclera: Conjunctivae normal.     Pupils: Pupils are equal, round, and reactive to light.  Neck:     Thyroid: No thyromegaly.  Cardiovascular:     Rate and Rhythm: Normal rate and regular rhythm.     Heart sounds: Normal heart sounds. No murmur heard. Pulmonary:     Effort: No respiratory distress.     Breath sounds: No wheezing or rales.  Abdominal:     General: Bowel sounds are normal. There is no distension.     Palpations: Abdomen is soft. There is no mass.     Tenderness: There is no abdominal tenderness. There is no guarding or rebound.  Musculoskeletal:     Cervical back: Normal range of motion and neck supple.     Right lower leg: No edema.     Left lower leg: No edema.  Lymphadenopathy:     Cervical: No cervical adenopathy.  Skin:    Findings: No rash.  Neurological:     Mental Status: He is alert and oriented to person, place, and time.     Cranial Nerves: No cranial nerve deficit.     Deep Tendon Reflexes: Reflexes normal.    There were no vitals taken for this visit. Wt Readings from Last 3 Encounters:  08/22/21 166 lb 3 oz (75.4 kg)  03/05/21 171 lb 6.4 oz (77.7 kg)  03/02/21 176 lb 5.9 oz (80 kg)     There are no preventive care reminders to display for this patient.  There are no preventive care reminders to display for this patient.  Lab Results  Component Value Date   TSH 0.57 03/05/2021   Lab Results  Component Value Date   WBC 4.5 08/22/2021   HGB 15.3 08/22/2021   HCT 46.8 08/22/2021   MCV 84.2 08/22/2021   PLT 62 (L) 08/22/2021   Lab Results  Component Value Date   NA 137 03/02/2021   K 4.1 03/02/2021   CHLORIDE 107 08/17/2015   CO2 28 03/02/2021   GLUCOSE 103 (H) 03/02/2021   BUN 14  03/02/2021   CREATININE 0.99 03/02/2021   BILITOT 0.7 11/16/2019   ALKPHOS 61 11/16/2019   AST 30 11/16/2019   ALT 24 11/16/2019   PROT 6.7 11/16/2019   ALBUMIN 4.2 11/16/2019   CALCIUM 9.2 03/02/2021   ANIONGAP 5 03/02/2021   EGFR 88 (L) 08/17/2015   GFR 89.40 11/16/2019   Lab Results  Component Value Date  CHOL 217 (H) 11/16/2019   Lab Results  Component Value Date   HDL 54.30 11/16/2019   Lab Results  Component Value Date   LDLCALC 153 (H) 11/16/2019   Lab Results  Component Value Date   TRIG 50.0 11/16/2019   Lab Results  Component Value Date   CHOLHDL 4 11/16/2019   Lab Results  Component Value Date   HGBA1C 5.4 11/22/2020      Assessment & Plan:   Problem List Items Addressed This Visit   None Visit Diagnoses     Physical exam    -  Primary     Patient has chronic thrombocytopenia which is stable and followed by hematology.  Hypothyroidism treated with levothyroxine. -Obtain labs as above -Flu vaccine offered but he declines -Try to establish more consistent exercise habits -We will need repeat colonoscopy in 2 years  No orders of the defined types were placed in this encounter.   Follow-up: No follow-ups on file.    Carolann Littler, MD

## 2021-12-17 ENCOUNTER — Other Ambulatory Visit: Payer: Self-pay | Admitting: Family Medicine

## 2022-01-07 ENCOUNTER — Ambulatory Visit (INDEPENDENT_AMBULATORY_CARE_PROVIDER_SITE_OTHER): Payer: BC Managed Care – PPO | Admitting: Family Medicine

## 2022-01-07 VITALS — BP 118/62 | HR 78 | Temp 98.0°F | Wt 173.2 lb

## 2022-01-07 DIAGNOSIS — M5412 Radiculopathy, cervical region: Secondary | ICD-10-CM

## 2022-01-07 MED ORDER — PREDNISONE 10 MG PO TABS
ORAL_TABLET | ORAL | 0 refills | Status: DC
Start: 2022-01-07 — End: 2022-10-29

## 2022-01-07 NOTE — Progress Notes (Signed)
Established Patient Office Visit  Subjective:  Patient ID: Joseph Lindsey, male    DOB: [birth year]  Age: 51 y.o. MRN: [id number]  CC:  Chief Complaint  Patient presents with   Neck Pain    Left side of neck, pain in the middle of the chest and middle of the back, when moving head there is a sharp pain that runs down the left arm x 4 days    HPI Joseph Lindsey presents for new problem of left-sided neck pain with occasional radiation down the left scapula but mostly down the left upper extremity.  He describes this as sharp and worse pain has had his life.  10 out of 10 in severity at times.  Worse with neck movement especially with lateral bending and rotation to the left.  Occasional tingling sensation left upper extremity but not consistently.  No persistent numbness.  No definite weakness.  Denies any injury.  No prior history of cervical difficulties.  No exertional chest pains.  No dyspnea.  No fevers or chills.  No appetite or weight changes.  He is try Tylenol without any relief.  He also tried some topical rubs without improvement.    Past Medical History:  Diagnosis Date   Fissure, anal    2003   Migraines    Primary thrombocytopenia, unspecified    Thyroid disease    hypothyroid    Past Surgical History:  Procedure Laterality Date   BREATH TEK H PYLORI N/A 11/11/2013   Procedure: BREATH TEK [name];  Surgeon: Jerene Bears, MD;  Location: [facility];  Service: Gastroenterology;  Laterality: N/A;    Family History  Problem Relation Age of Onset   Hypertension Father    Diabetes Father    Asthma Father    Diabetes Sister    Asthma Sister    Asthma Brother    Cancer Brother 5       gastric cancer   Stomach cancer Brother    Sickle cell trait Other        mother   Colon cancer Neg Hx    Esophageal cancer Neg Hx    Rectal cancer Neg Hx     Social History   Socioeconomic History   Marital status: Married    Spouse name: Not on file   Number of  children: 1   Years of education: Not on file   Highest education level: Not on file  Occupational History    Employer: [employer]  Tobacco Use   Smoking status: Never   Smokeless tobacco: Never  Vaping Use   Vaping Use: Never used  Substance and Sexual Activity   Alcohol use: Yes    Comment: once a week   Drug use: No   Sexual activity: Not on file  Other Topics Concern   Not on file  Social History Narrative   Not on file   Social Determinants of Health   Financial Resource Strain: Not on file  Food Insecurity: Not on file  Transportation Needs: Not on file  Physical Activity: Not on file  Stress: Not on file  Social Connections: Not on file  Intimate Partner Violence: Not on file    Outpatient Medications Prior to Visit  Medication Sig Dispense Refill   fexofenadine (ALLEGRA) 180 MG tablet Take 180 mg by mouth daily.     levothyroxine (SYNTHROID) 100 MCG tablet TAKE 1 TABLET DAILY 90 tablet 3   triamcinolone cream (KENALOG) 0.1 % APPLY TOPICALLY TWICE  A DAY AS NEEDED 30 g 3   No facility-administered medications prior to visit.    No Known Allergies  ROS Review of Systems  Constitutional:  Negative for appetite change, chills, fever and unexpected weight change.  Respiratory:  Negative for cough and shortness of breath.   Cardiovascular:  Negative for leg swelling.  Musculoskeletal:  Positive for neck pain.  Neurological:  Negative for dizziness, seizures, syncope, speech difficulty, weakness and headaches.  Hematological:  Negative for adenopathy.     Objective:    Physical Exam Vitals reviewed.  Constitutional:      Appearance: Normal appearance.  Neck:     Comments: No localized spinal tenderness.  He has left-sided neck pain with rotation and lateral bending to the left and much lesser degree to the right.  Also has some pain with neck extension but lesser so with flexion. Cardiovascular:     Rate and Rhythm: Normal rate and regular rhythm.   Pulmonary:     Effort: Pulmonary effort is normal.     Breath sounds: Normal breath sounds.  Neurological:     Mental Status: He is alert.     Comments: No weakness upper extremities.  Biceps and brachioradialis reflexes 1+ bilaterally.  Normal sensory function throughout    BP 118/62 (BP Location: Left Arm, Patient Position: Sitting, Cuff Size: Normal)    Pulse 78    Temp 98 F (36.7 C) (Oral)    Wt 173 lb 3.2 oz (78.6 kg)    SpO2 100%    BMI 25.58 kg/m  Wt Readings from Last 3 Encounters:  01/07/22 173 lb 3.2 oz (78.6 kg)  12/05/21 169 lb (76.7 kg)  08/22/21 166 lb 3 oz (75.4 kg)     There are no preventive care reminders to display for this patient.  There are no preventive care reminders to display for this patient.  Lab Results  Component Value Date   TSH 1.03 12/05/2021   Lab Results  Component Value Date   WBC 5.3 12/05/2021   HGB 15.6 12/05/2021   HCT 47.6 12/05/2021   MCV 85.0 12/05/2021   PLT 41.0 Repeated and verified X2. (LL) 12/05/2021   Lab Results  Component Value Date   NA 137 12/05/2021   K 4.3 12/05/2021   CHLORIDE 107 08/17/2015   CO2 29 12/05/2021   GLUCOSE 87 12/05/2021   BUN 11 12/05/2021   CREATININE 1.09 12/05/2021   BILITOT 0.5 12/05/2021   ALKPHOS 69 12/05/2021   AST 39 (H) 12/05/2021   ALT 37 12/05/2021   PROT 7.0 12/05/2021   ALBUMIN 4.2 12/05/2021   CALCIUM 9.3 12/05/2021   ANIONGAP 5 03/02/2021   EGFR 88 (L) 08/17/2015   GFR 79.60 12/05/2021   Lab Results  Component Value Date   CHOL 223 (H) 12/05/2021   Lab Results  Component Value Date   HDL 57.20 12/05/2021   Lab Results  Component Value Date   LDLCALC 151 (H) 12/05/2021   Lab Results  Component Value Date   TRIG 73.0 12/05/2021   Lab Results  Component Value Date   CHOLHDL 4 12/05/2021   Lab Results  Component Value Date   HGBA1C 5.5 12/05/2021      Assessment & Plan:   Patient presents with new problem of left-sided neck pain with some radiation  down left upper extremity.  Suspect probable cervical nerve root irritation possibly from disc herniation.  Prognosis exact etiology unclear at this point.  He has not had any known  history of cervical arthritis.  Pain has been severe at times.  Doubt cardiac.  His symptoms are consistently worse with neck movement and he describes this as a sharp pain.  We discussed the following  -Consider trial of prednisone taper. -We did offer and discussed possible medication such as gabapentin or Lyrica but he declines at this time.  He wishes to avoid pain medications if possible. -Watch closely for any progressive weakness or numbness. -Recommend 1 week follow-up.  Not improving at that point consider gabapentin or Lyrica and possible x-rays  Meds ordered this encounter  Medications   predniSONE (DELTASONE) 10 MG tablet    Sig: Taper as follows over 10 days :   6-6-4-4-3-3-2-2-1-1    Dispense:  32 tablet    Refill:  0    Follow-up: Return in about 1 week (around 01/14/2022).    Carolann Littler, MD

## 2022-01-08 ENCOUNTER — Ambulatory Visit: Payer: BC Managed Care – PPO | Admitting: Family Medicine

## 2022-01-15 ENCOUNTER — Ambulatory Visit (INDEPENDENT_AMBULATORY_CARE_PROVIDER_SITE_OTHER): Payer: BC Managed Care – PPO | Admitting: Family Medicine

## 2022-01-15 VITALS — BP 110/70 | HR 73 | Temp 98.0°F | Wt 169.4 lb

## 2022-01-15 DIAGNOSIS — M5412 Radiculopathy, cervical region: Secondary | ICD-10-CM

## 2022-01-15 DIAGNOSIS — R197 Diarrhea, unspecified: Secondary | ICD-10-CM | POA: Diagnosis not present

## 2022-01-15 NOTE — Patient Instructions (Signed)
Please let me know if neck pain returns in severity or for any upper extremity weakness

## 2022-01-15 NOTE — Progress Notes (Signed)
Established Patient Office Visit  Subjective:  Patient ID: Joseph Lindsey, male    DOB: Nov 04, 1971  Age: 51 y.o. MRN: [id number]  CC:  Chief Complaint  Patient presents with   Follow-up    HPI Joseph Lindsey presents for follow-up left cervical radiculitis symptoms.  Refer to note from last week for details  [name] presents for new problem of left-sided neck pain with occasional radiation down the left scapula but mostly down the left upper extremity.  He describes this as sharp and worse pain has had his life.  10 out of 10 in severity at times.  Worse with neck movement especially with lateral bending and rotation to the left.  Occasional tingling sensation left upper extremity but not consistently.  No persistent numbness.  No definite weakness.  Denies any injury.  No prior history of cervical difficulties.  No exertional chest pains.  No dyspnea.  No fevers or chills.  No appetite or weight changes.  He is try Tylenol without any relief.  He also tried some topical rubs without improvement.    We started prednisone taper and he has seen substantial improvement.  His pain has been 10 out of 10 and currently almost no pain.  He does have some numbness involving his left index finger.  Not totally numb.  No weakness.  Pain that had been radiating toward his chest and scapular region has completely resolved.  He does have separate issue of some diarrhea which started just late yesterday.  He states his children had recent intestinal illness with diarrhea.  He denies any nausea or vomiting.  No bloody stool.  No fever.  Past Medical History:  Diagnosis Date   Fissure, anal    2003   Migraines    Primary thrombocytopenia, unspecified    Thyroid disease    hypothyroid    Past Surgical History:  Procedure Laterality Date   BREATH TEK H PYLORI N/A 11/11/2013   Procedure: BREATH TEK [name];  Surgeon: Jerene Bears, MD;  Location: [facility];  Service:  Gastroenterology;  Laterality: N/A;    Family History  Problem Relation Age of Onset   Hypertension Father    Diabetes Father    Asthma Father    Diabetes Sister    Asthma Sister    Asthma Brother    Cancer Brother 38       gastric cancer   Stomach cancer Brother    Sickle cell trait Other        mother   Colon cancer Neg Hx    Esophageal cancer Neg Hx    Rectal cancer Neg Hx     Social History   Socioeconomic History   Marital status: Married    Spouse name: Not on file   Number of children: 1   Years of education: Not on file   Highest education level: Not on file  Occupational History    Employer: [employer]  Tobacco Use   Smoking status: Never   Smokeless tobacco: Never  Vaping Use   Vaping Use: Never used  Substance and Sexual Activity   Alcohol use: Yes    Comment: once a week   Drug use: No   Sexual activity: Not on file  Other Topics Concern   Not on file  Social History Narrative   Not on file   Social Determinants of Health   Financial Resource Strain: Not on file  Food Insecurity: Not on file  Transportation  Needs: Not on file  Physical Activity: Not on file  Stress: Not on file  Social Connections: Not on file  Intimate Partner Violence: Not on file    Outpatient Medications Prior to Visit  Medication Sig Dispense Refill   fexofenadine (ALLEGRA) 180 MG tablet Take 180 mg by mouth daily.     levothyroxine (SYNTHROID) 100 MCG tablet TAKE 1 TABLET DAILY 90 tablet 3   predniSONE (DELTASONE) 10 MG tablet Taper as follows over 10 days :   6-6-4-4-3-3-2-2-1-1 32 tablet 0   triamcinolone cream (KENALOG) 0.1 % APPLY TOPICALLY TWICE A DAY AS NEEDED 30 g 3   No facility-administered medications prior to visit.    No Known Allergies  ROS Review of Systems  Constitutional:  Negative for chills and fever.  Respiratory:  Negative for shortness of breath.   Gastrointestinal:  Positive for diarrhea. Negative for abdominal pain, blood in  stool, nausea and vomiting.  Musculoskeletal:  Positive for neck pain.  Neurological:  Positive for numbness. Negative for weakness.     Objective:    Physical Exam Vitals reviewed.  Constitutional:      Appearance: Normal appearance.  Cardiovascular:     Rate and Rhythm: Normal rate and regular rhythm.  Pulmonary:     Effort: Pulmonary effort is normal.     Breath sounds: Normal breath sounds. No wheezing or rales.  Abdominal:     Palpations: Abdomen is soft.     Tenderness: There is no abdominal tenderness.  Neurological:     Mental Status: He is alert.     Comments: Symmetric reflexes upper extremities.  He has full grip strength bilaterally.  No upper extremity weakness.  Left index finger subjectively impaired with monofilament compared to other digits does have some sensation    BP 110/70 (BP Location: Left Arm, Patient Position: Sitting, Cuff Size: Normal)    Pulse 73    Temp 98 F (36.7 C) (Oral)    Wt 169 lb 6.4 oz (76.8 kg)    SpO2 99%    BMI 25.02 kg/m  Wt Readings from Last 3 Encounters:  01/15/22 169 lb 6.4 oz (76.8 kg)  01/07/22 173 lb 3.2 oz (78.6 kg)  12/05/21 169 lb (76.7 kg)     There are no preventive care reminders to display for this patient.  There are no preventive care reminders to display for this patient.  Lab Results  Component Value Date   TSH 1.03 12/05/2021   Lab Results  Component Value Date   WBC 5.3 12/05/2021   HGB 15.6 12/05/2021   HCT 47.6 12/05/2021   MCV 85.0 12/05/2021   PLT 41.0 Repeated and verified X2. (LL) 12/05/2021   Lab Results  Component Value Date   NA 137 12/05/2021   K 4.3 12/05/2021   CHLORIDE 107 08/17/2015   CO2 29 12/05/2021   GLUCOSE 87 12/05/2021   BUN 11 12/05/2021   CREATININE 1.09 12/05/2021   BILITOT 0.5 12/05/2021   ALKPHOS 69 12/05/2021   AST 39 (H) 12/05/2021   ALT 37 12/05/2021   PROT 7.0 12/05/2021   ALBUMIN 4.2 12/05/2021   CALCIUM 9.3 12/05/2021   ANIONGAP 5 03/02/2021   EGFR 88 (L)  08/17/2015   GFR 79.60 12/05/2021   Lab Results  Component Value Date   CHOL 223 (H) 12/05/2021   Lab Results  Component Value Date   HDL 57.20 12/05/2021   Lab Results  Component Value Date   LDLCALC 151 (H) 12/05/2021   Lab  Results  Component Value Date   TRIG 73.0 12/05/2021   Lab Results  Component Value Date   CHOLHDL 4 12/05/2021   Lab Results  Component Value Date   HGBA1C 5.5 12/05/2021      Assessment & Plan:   #1 cervical radiculitis involving the left side of neck.  Greatly improved following prednisone.  He does have some residual numbness involving his left index finger but no weakness.  We recommend observation for now.  Follow-up promptly for any weakness or any recurrent severe pain.  If pain recurs consider MRI  #2 diarrhea.  Suspect viral.  Several family members with similar symptoms recently. -Discussed appropriate diet for diarrhea. -Follow-up for any bloody stools, fever, or other concerns   No orders of the defined types were placed in this encounter.   Follow-up: No follow-ups on file.    Carolann Littler, MD

## 2022-01-18 ENCOUNTER — Telehealth: Payer: Self-pay | Admitting: Family Medicine

## 2022-01-18 DIAGNOSIS — M5412 Radiculopathy, cervical region: Secondary | ICD-10-CM

## 2022-01-18 NOTE — Telephone Encounter (Signed)
Placed orders for cervical spine films to get through our office and go from there.  May need MRI to further assess but will start with cervical spine films

## 2022-01-18 NOTE — Telephone Encounter (Signed)
Pt is calling and was seen on 01-15-2022 and still having neck pain and numbness in his fingers and would like to know the next step. Please advise

## 2022-01-18 NOTE — Telephone Encounter (Signed)
Atc, unable to leave a message.  

## 2022-01-19 DIAGNOSIS — M9901 Segmental and somatic dysfunction of cervical region: Secondary | ICD-10-CM | POA: Diagnosis not present

## 2022-01-19 DIAGNOSIS — M531 Cervicobrachial syndrome: Secondary | ICD-10-CM | POA: Diagnosis not present

## 2022-01-19 DIAGNOSIS — M5414 Radiculopathy, thoracic region: Secondary | ICD-10-CM | POA: Diagnosis not present

## 2022-01-19 DIAGNOSIS — M5032 Other cervical disc degeneration, mid-cervical region, unspecified level: Secondary | ICD-10-CM | POA: Diagnosis not present

## 2022-01-21 DIAGNOSIS — M5414 Radiculopathy, thoracic region: Secondary | ICD-10-CM | POA: Diagnosis not present

## 2022-01-21 DIAGNOSIS — M9901 Segmental and somatic dysfunction of cervical region: Secondary | ICD-10-CM | POA: Diagnosis not present

## 2022-01-21 DIAGNOSIS — M5032 Other cervical disc degeneration, mid-cervical region, unspecified level: Secondary | ICD-10-CM | POA: Diagnosis not present

## 2022-01-21 DIAGNOSIS — M531 Cervicobrachial syndrome: Secondary | ICD-10-CM | POA: Diagnosis not present

## 2022-01-21 NOTE — Telephone Encounter (Signed)
Spoke with the patient and discussed Dr. Mar Daring message in detail. Patient expressed understanding. Nothing further needed at this time.  Patient eill call back to schedule an appointment.

## 2022-01-22 ENCOUNTER — Other Ambulatory Visit: Payer: BC Managed Care – PPO

## 2022-01-22 ENCOUNTER — Ambulatory Visit (INDEPENDENT_AMBULATORY_CARE_PROVIDER_SITE_OTHER)
Admission: RE | Admit: 2022-01-22 | Discharge: 2022-01-22 | Disposition: A | Discharge status: Home / Self Care | Payer: BC Managed Care – PPO | Source: Ambulatory Visit | Attending: Family Medicine | Admitting: Family Medicine

## 2022-01-22 ENCOUNTER — Other Ambulatory Visit: Payer: Self-pay

## 2022-01-22 DIAGNOSIS — M542 Cervicalgia: Secondary | ICD-10-CM | POA: Diagnosis not present

## 2022-01-22 DIAGNOSIS — M5412 Radiculopathy, cervical region: Secondary | ICD-10-CM

## 2022-01-25 ENCOUNTER — Telehealth: Payer: Self-pay | Admitting: Family Medicine

## 2022-01-25 DIAGNOSIS — M5412 Radiculopathy, cervical region: Secondary | ICD-10-CM

## 2022-01-25 NOTE — Telephone Encounter (Signed)
Pt called with ongoing/worsening neck pain.  Will set up MRI cervical spine.

## 2022-02-06 ENCOUNTER — Ambulatory Visit
Admission: RE | Admit: 2022-02-06 | Discharge: 2022-02-06 | Disposition: A | Discharge status: Home / Self Care | Payer: BC Managed Care – PPO | Source: Ambulatory Visit | Attending: Family Medicine | Admitting: Family Medicine

## 2022-02-06 DIAGNOSIS — M5412 Radiculopathy, cervical region: Secondary | ICD-10-CM | POA: Diagnosis not present

## 2022-02-06 DIAGNOSIS — R200 Anesthesia of skin: Secondary | ICD-10-CM | POA: Diagnosis not present

## 2022-02-06 DIAGNOSIS — M4802 Spinal stenosis, cervical region: Secondary | ICD-10-CM | POA: Diagnosis not present

## 2022-02-07 ENCOUNTER — Other Ambulatory Visit: Payer: Self-pay

## 2022-02-07 DIAGNOSIS — M5412 Radiculopathy, cervical region: Secondary | ICD-10-CM

## 2022-02-14 ENCOUNTER — Other Ambulatory Visit: Payer: BC Managed Care – PPO

## 2022-02-20 ENCOUNTER — Ambulatory Visit: Payer: BC Managed Care – PPO | Admitting: Oncology

## 2022-02-20 ENCOUNTER — Other Ambulatory Visit: Payer: BC Managed Care – PPO

## 2022-03-06 DIAGNOSIS — Z6826 Body mass index (BMI) 26.0-26.9, adult: Secondary | ICD-10-CM | POA: Diagnosis not present

## 2022-03-06 DIAGNOSIS — R030 Elevated blood-pressure reading, without diagnosis of hypertension: Secondary | ICD-10-CM | POA: Diagnosis not present

## 2022-03-06 DIAGNOSIS — M4722 Other spondylosis with radiculopathy, cervical region: Secondary | ICD-10-CM | POA: Diagnosis not present

## 2022-03-11 DIAGNOSIS — H40023 Open angle with borderline findings, high risk, bilateral: Secondary | ICD-10-CM | POA: Diagnosis not present

## 2022-03-26 NOTE — Therapy (Incomplete)
?OUTPATIENT PHYSICAL THERAPY CERVICAL EVALUATION ? ? ?Patient Name: Joseph Lindsey ?MRN: [id number] ?DOB:Dec 28, 1970, 51 y.o., male ?Today's Date: 03/26/2022 ? ? ? ?Past Medical History:  ?Diagnosis Date  ? Fissure, anal   ? 2003  ? Migraines   ? Primary thrombocytopenia, unspecified   ? Thyroid disease   ? hypothyroid  ? ?Past Surgical History:  ?Procedure Laterality Date  ? BREATH TEK H PYLORI N/A 11/11/2013  ? Procedure: BREATH TEK H PYLORI;  Surgeon: Beverley Fiedler, MD;  Location: [facility];  Service: Gastroenterology;  Laterality: N/A;  ? ?Patient Active Problem List  ? Diagnosis Date Noted  ? Thrombocytopenia (HCC) 05/31/2010  ? TRANSAMINASES, SERUM, ELEVATED 05/31/2010  ? Hypothyroidism 05/10/2010  ? NECK PAIN, LEFT 05/10/2010  ? ? ?PCP: Kristian Covey, MD ? ?REFERRING PROVIDER: Lisbeth Renshaw, MD ? ?REFERRING DIAG: M47.22 (ICD-10-CM) - Other spondylosis with radiculopathy, cervical region ? ?THERAPY DIAG:  ?No diagnosis found. ? ?ONSET DATE: *** ? ?SUBJECTIVE:                                                                                                                                                                                                        ? ?SUBJECTIVE STATEMENT: ?*** ? ?PERTINENT HISTORY:  ?*** ? ?PAIN:  ?Are you having pain? Yes: NPRS scale: ***/10 ?Pain location: ?Pain description: ?Aggravating factors: ?Relieving factors:  ? ?PRECAUTIONS: None ? ?WEIGHT BEARING RESTRICTIONS No ? ?FALLS:  ?Has patient fallen in last 6 months? {fallsyesno:27318} ? ?LIVING ENVIRONMENT: ?Lives with: {[facility] lives with:25569::"lives with their family"} ?Lives in: {Lives in:[address]} ? ? ?OCCUPATION: *** ? ?PLOF: {PLOF:24004} ? ?PATIENT GOALS *** ? ?OBJECTIVE:  ? ?DIAGNOSTIC FINDINGS:  ?MRI on 02/06/22: ?1. Multilevel cervical disc degeneration, worst at C5-6 where there ?is mild-to-moderate spinal stenosis and moderate left greater than ?right neural foraminal stenosis. ?2. Mild spinal stenosis and moderate  left foraminal stenosis at ?C6-7. ?3. Mild spinal stenosis at C4-5. ? ?PATIENT SURVEYS:  ?FOTO *** ? ? ?COGNITION: ?Overall cognitive status: Within functional limits for tasks assessed ? ? ?SENSATION: ?{sensation:27233} ? ?POSTURE:  ?*** ? ?PALPATION: ?***  ? ?CERVICAL ROM:  ? ?Active ROM A/PROM (deg) ?03/26/2022  ?Flexion   ?Extension   ?Right lateral flexion   ?Left lateral flexion   ?Right rotation   ?Left rotation   ? (Blank rows = not tested) ? ?UE ROM: ? ?Active ROM Right ?03/26/2022 Left ?03/26/2022  ?Shoulder flexion    ?Shoulder extension    ?Shoulder abduction    ?Shoulder adduction    ?Shoulder extension    ?Shoulder internal  rotation    ?Shoulder external rotation    ?Elbow flexion    ?Elbow extension    ?Wrist flexion    ?Wrist extension    ?Wrist ulnar deviation    ?Wrist radial deviation    ?Wrist pronation    ?Wrist supination    ? (Blank rows = not tested) ? ?UE MMT: ? ?MMT Right ?03/26/2022 Left ?03/26/2022  ?Shoulder flexion    ?Shoulder extension    ?Shoulder abduction    ?Shoulder adduction    ?Shoulder extension    ?Shoulder internal rotation    ?Shoulder external rotation    ?Middle trapezius    ?Lower trapezius    ?Elbow flexion    ?Elbow extension    ?Wrist flexion    ?Wrist extension    ?Wrist ulnar deviation    ?Wrist radial deviation    ?Wrist pronation    ?Wrist supination    ?Grip strength    ? (Blank rows = not tested) ? ?CERVICAL SPECIAL TESTS:  ?Neck flexor muscle endurance test: {pos/neg:25243}, Upper limb tension test (ULTT): {pos/neg:25243}, Spurling's test: {pos/neg:25243}, Distraction test: {pos/neg:25243}, and Sharp pursor's test: {pos/neg:25243} ? ? ?FUNCTIONAL TESTS:  ?Dynanometer:  ? Left: *** ? Right: *** ? ?TODAY'S TREATMENT:  ?Discussed findings in evaluation and HEP. ? ? ?PATIENT EDUCATION:  ?Education details: Discussed findings in evaluation and HEP. ?Person educated: Patient ?Education method: Explanation, Demonstration, and Handouts ?Education comprehension: verbalized  understanding ? ? ?HOME EXERCISE PROGRAM: ?*** ? ?ASSESSMENT: ? ?CLINICAL IMPRESSION: ?Patient is a 51 y.o. male who was seen today for physical therapy evaluation and treatment for cervical pain.  ? ? ?OBJECTIVE IMPAIRMENTS {opptimpairments:25111}.  ? ?ACTIVITY LIMITATIONS {activity limitations:25113}.  ? ?PERSONAL FACTORS {Personal factors:25162} are also affecting patient's functional outcome.  ? ? ?REHAB POTENTIAL: {rehabpotential:25112} ? ?CLINICAL DECISION MAKING: {clinical decision making:25114} ? ?EVALUATION COMPLEXITY: {Evaluation complexity:25115} ? ? ?GOALS: ? ?SHORT TERM GOALS: Target date: {follow up:25551} ? ?Patient will be independent with HEP for PT progression.  ?Baseline: initial HEP addressed ?Goal status: INITIAL ? ? ? ?LONG TERM GOALS: Target date: {follow up:25551} ? ?Patient will score *** on FOTO.  ?Baseline: *** ?Goal status: INITIAL ? ?2.  *** ?Baseline: *** ?Goal status: INITIAL ? ?3.  *** ?Baseline: *** ?Goal status: INITIAL ? ?4.  *** ?Baseline: *** ?Goal status: INITIAL ? ?5.  *** ?Baseline: *** ?Goal status: INITIAL ? ? ? ? ?PLAN: ?PT FREQUENCY: {rehab frequency:25116} ? ?PT DURATION: {rehab duration:25117} ? ?PLANNED INTERVENTIONS: {rehab planned interventions:25118::"Therapeutic exercises","Therapeutic activity","Neuromuscular re-education","Balance training","Gait training","Patient/Family education","Joint mobilization"} ? ?PLAN FOR NEXT SESSION: *** ? ? ?Curly Rim, PT, DPT ?03/26/2022, 11:18 AM ? ? ? ?  ?

## 2022-03-28 ENCOUNTER — Ambulatory Visit: Payer: BC Managed Care – PPO | Attending: Neurosurgery

## 2022-03-28 ENCOUNTER — Other Ambulatory Visit: Payer: Self-pay

## 2022-03-28 DIAGNOSIS — R293 Abnormal posture: Secondary | ICD-10-CM

## 2022-03-28 DIAGNOSIS — M6281 Muscle weakness (generalized): Secondary | ICD-10-CM

## 2022-03-28 DIAGNOSIS — M542 Cervicalgia: Secondary | ICD-10-CM | POA: Diagnosis not present

## 2022-03-28 DIAGNOSIS — M79602 Pain in left arm: Secondary | ICD-10-CM

## 2022-03-28 NOTE — Therapy (Signed)
?OUTPATIENT PHYSICAL THERAPY CERVICAL EVALUATION ? ? ?Patient Name: Joseph Lindsey ?MRN: [id number] ?DOB:[birth year], 51 y.o., male ?Today's Date: 03/28/2022 ? ? PT End of Session - 03/28/22 0847   ? ? Visit Number 1   ? Number of Visits 9   ? Authorization Type BCBS   ? PT Start Time [phone]   ? PT Stop Time 0936   ? PT Time Calculation (min) 49 min   ? Activity Tolerance Patient tolerated treatment well   ? Behavior During Therapy [facility] for tasks assessed/performed   ? ?  ?  ? ?  ? ? ?Past Medical History:  ?Diagnosis Date  ? Fissure, anal   ? 2003  ? Migraines   ? Primary thrombocytopenia, unspecified   ? Thyroid disease   ? hypothyroid  ? ?Past Surgical History:  ?Procedure Laterality Date  ? BREATH TEK H PYLORI N/A 11/11/2013  ? Procedure: BREATH TEK H PYLORI;  Surgeon: Jerene Bears, MD;  Location: [facility];  Service: Gastroenterology;  Laterality: N/A;  ? ?Patient Active Problem List  ? Diagnosis Date Noted  ? Thrombocytopenia ([place]) 05/31/2010  ? TRANSAMINASES, SERUM, ELEVATED 05/31/2010  ? Hypothyroidism 05/10/2010  ? NECK PAIN, LEFT 05/10/2010  ? ? ?PCP: Eulas Post, MD ? ?REFERRING PROVIDER: Consuella Lose, MD ? ?REFERRING DIAG: M47.22 (ICD-10-CM) - Other spondylosis with radiculopathy, cervical region  ? ? ?THERAPY DIAG:  ?Abnormal posture - Plan: PT plan of care cert/re-cert ? ?Pain in left arm - Plan: PT plan of care cert/re-cert ? ?Cervicalgia - Plan: PT plan of care cert/re-cert ? ?Muscle weakness (generalized) - Plan: PT plan of care cert/re-cert ? ?ONSET DATE: since February of 2023  ? ? ?SUBJECTIVE:                                                                                                                                                                                                        ? ?SUBJECTIVE STATEMENT: ?"I've been having pain in neck and it has moved down into my scapular. Now it hurts extremely in the left arm. Looking up and looking to the left shoots pain down into left  arm. My pointer and half of my middle finger on my left arm always stays numb. When I'm tired and stressed my pain is worse. The pain gradually got worse."  ? ?PERTINENT HISTORY:  ?Gradual onset of neck pain started in February ? ?PAIN:  ?Are you having pain? Yes: NPRS scale: 4/10  ?NPRS: patient reports pain 15/10 when tired, moving head up and turning to the L  ?  Pain location: lateral left arm and pointer and middle finger of left arm  ?Pain description: shooting, tingling in left arm  ?Aggravating factors: when turning head to the left and looking up  ?Relieving factors: rest  ? ?PRECAUTIONS: None ? ?WEIGHT BEARING RESTRICTIONS No ? ?FALLS:  ?Has patient fallen in last 6 months? No ? ?LIVING ENVIRONMENT: ?Lives with: lives with their family ?Lives in: House/apartment ? ? ?OCCUPATION: Merchant navy officer at a [employer]  ? ?PLOF: Independent ? ?PATIENT GOALS "Release the nerve from being pinched."  ? ?OBJECTIVE:  ? ?DIAGNOSTIC FINDINGS:  ?MRI on 02/06/22:  ?1. Multilevel cervical disc degeneration, worst at C5-6 where there  ?is mild-to-moderate spinal stenosis and moderate left greater than  ?right neural foraminal stenosis.  ?2. Mild spinal stenosis and moderate left foraminal stenosis at  ?C6-7.  ?3. Mild spinal stenosis at C4-5.  ? ?PATIENT SURVEYS:  ?FOTO 59, predicted 71  ? ? ?COGNITION: ?Overall cognitive status: Within functional limits for tasks assessed ? ? ?SENSATION: ?WFL ? ?POSTURE:  ?Rounded shoulders and forward head ? ?PALPATION: ?No tenderness to palpation  ? ?CERVICAL ROM:  ? ?Active ROM A/PROM (deg) ?03/28/2022  ?Flexion 50  ?Extension 43*  ?Right lateral flexion 35  ?Left lateral flexion 35  ?Right rotation 70  ?Left rotation 67*  ? (Blank rows = not tested) ?Key: * = reproduced pain ?UE ROM: ? ?Active ROM Right ?03/28/2022 Left ?03/28/2022  ?Shoulder flexion    ?Shoulder extension    ?Shoulder abduction    ?Shoulder adduction    ?Shoulder extension    ?Shoulder internal rotation    ?Shoulder  external rotation    ?Elbow flexion    ?Elbow extension    ?Wrist flexion    ?Wrist extension    ?Wrist ulnar deviation    ?Wrist radial deviation    ?Wrist pronation    ?Wrist supination    ? (Blank rows = not tested) ?All WNL  ? ?UE MMT: ? ?MMT Right ?03/28/2022 Left ?03/28/2022  ?Shoulder flexion 5 5  ?Shoulder extension 5 5  ?Shoulder abduction 5 5  ?Shoulder adduction    ?Shoulder extension 5 5  ?Shoulder internal rotation 5 5  ?Shoulder external rotation 4 4  ?Middle trapezius 4- 4-  ?Lower trapezius 3+ 3+  ?Elbow flexion 5 5  ?Elbow extension 5 5  ?Wrist flexion    ?Wrist extension    ?Wrist ulnar deviation    ?Wrist radial deviation    ?Wrist pronation    ?Wrist supination    ?Grip strength    ? (Blank rows = not tested) ? ?CERVICAL SPECIAL TESTS:  ?Neck flexor muscle endurance test: Positive, Upper limb tension test (ULTT): Negative, Spurling's test: Negative, Distraction test: Negative, and Sharp pursor's test: Negative ?Brachial Plexus compression: + left UE  ? ?FUNCTIONAL TESTS:  ?Dynanometer:  ?Left: 115 lbs  ?Right: 135 lbs  ? ? ? ?TODAY'S TREATMENT:  ?Educated patient regarding HEP and findings in evaluation. ? ? ?PATIENT EDUCATION:  ?Education details: Educated patient regarding HEP and findings in evaluation. ?Person educated: Patient ?Education method: Explanation, Demonstration, Tactile cues, Verbal cues, and Handouts ?Education comprehension: verbalized understanding ? ? ?HOME EXERCISE PROGRAM: ?Access Code: [email or web address]  ?URL: [email or web address]  ?Date: 03/28/2022  ?Prepared by: Edythe Lynn  ? ?Exercises  ?- Supine Chin Tuck  - 1 x daily - 7 x weekly - 2 sets - 10 reps  ?- Seated Assisted Cervical Rotation with Towel  - 1 x daily - 7 x weekly - 1 sets -  10 reps - 5 seconds hold  ?- Seated Scapular Retraction  - 1 x daily - 7 x weekly - 2 sets - 10 reps  ? ? ?ASSESSMENT: ? ?CLINICAL IMPRESSION: ?Patient is a 51 y.o. male who was seen today for physical therapy evaluation and  treatment with chief complaints of left UE tingling pain. Patient's left arm pain only reproduced upon turning his head to the left and looking up along with brachial plexus compression of the left arm. He demonstrates abnormal posture along with weakness in his posterior shoulder musculature. Cervical extension, lateral flexion, and left cervical rotation ROM were limited. Patient will benefit from skilled PT services to address deficits and reduce pain.  ? ? ?OBJECTIVE IMPAIRMENTS decreased strength and pain.  ? ?ACTIVITY LIMITATIONS driving and recreational activities .  ? ?PERSONAL FACTORS 1-2 comorbidities: hx of migraines and anxiety  are also affecting patient's functional outcome.  ? ? ?REHAB POTENTIAL: Good ? ?CLINICAL DECISION MAKING: Stable/uncomplicated ? ?EVALUATION COMPLEXITY: Low ? ? ?GOALS: ? ?SHORT TERM GOALS: Target date: 04/11/2022 ? ?Patient will be independent with HEP for PT progression. ?Baseline: initial HEP addressed ?Goal status: INITIAL ? ? ? ?LONG TERM GOALS: Target date: 04/25/2022 ? ?Patient will score 72 on FOTO.  ?Baseline: 59 ?Goal status: INITIAL ? ?2.  Patient will be able to turn his head to the left approximately 70 degrees to assist with looking over his left shoulder to drive with [id number] left arm pain.  ?Baseline: 15/10 left arm pain per patient report  ?Goal status: INITIAL ? ?3.  Patient will be able to perform 45 degrees of neck extension without any left arm pain to assist with looking at objects in kitchen cabinet.  ?Baseline: 43 degrees with left arm pain  ?Goal status: INITIAL ? ?4.  Patient will be able to get back to working out in the gym without and neck or left arm pain.  ?Baseline: no longer working out since pain started  ?Goal status: INITIAL ? ?5.  Patient will demonstrate >/= 4/5 posterior shoulder strength to assist with improving posture.  ?Baseline: 3+/5 bil low trap  ?Goal status: INITIAL ? ?PLAN: ?PT FREQUENCY: 2x/week ? ?PT DURATION: 4 weeks ? ?PLANNED  INTERVENTIONS: Therapeutic exercises, Therapeutic activity, Neuromuscular re-education, Patient/Family education, Joint manipulation, Joint mobilization, Dry Needling, Electrical stimulation, Spinal mobilization, Cry

## 2022-03-28 NOTE — Therapy (Signed)
?OUTPATIENT PHYSICAL THERAPY TREATMENT NOTE ? ? ?Patient Name: Joseph Lindsey ?MRN: [id number] ?DOB:[birth year], 51 y.o., male ?Today's Date: 04/01/2022 ? ?PCP: Eulas Post, MD ?REFERRING PROVIDER: Eulas Post, MD ? ?END OF SESSION:  ? PT End of Session - 04/01/22 1016   ? ? Visit Number 2   ? Number of Visits 9   ? Authorization Type BCBS   ? PT Start Time [id number]   ? PT Stop Time 1013   ? PT Time Calculation (min) 38 min   ? Activity Tolerance Patient tolerated treatment well   ? Behavior During Therapy [facility] for tasks assessed/performed   ? ?  ?  ? ?  ? ? ?Past Medical History:  ?Diagnosis Date  ? Fissure, anal   ? 2003  ? Migraines   ? Primary thrombocytopenia, unspecified   ? Thyroid disease   ? hypothyroid  ? ?Past Surgical History:  ?Procedure Laterality Date  ? BREATH TEK H PYLORI N/A 11/11/2013  ? Procedure: BREATH TEK H PYLORI;  Surgeon: Jerene Bears, MD;  Location: [facility];  Service: Gastroenterology;  Laterality: N/A;  ? ?Patient Active Problem List  ? Diagnosis Date Noted  ? Thrombocytopenia ([place]) 05/31/2010  ? TRANSAMINASES, SERUM, ELEVATED 05/31/2010  ? Hypothyroidism 05/10/2010  ? NECK PAIN, LEFT 05/10/2010  ? ? ?REFERRING DIAG: M47.22 (ICD-10-CM) - Other spondylosis with radiculopathy, cervical region  ? ?THERAPY DIAG:  ?Abnormal posture ? ?Pain in left arm ? ?Cervicalgia ? ?Muscle weakness (generalized) ? ?PERTINENT HISTORY: Gradual onset of neck pain started in February ? ?PRECAUTIONS: None ? ?SUBJECTIVE: "The pain is getting better. The exercises are going good." ? ?PAIN:  ?Are you having pain? Yes: NPRS scale: 3/10 ?Pain location: left arm ?Pain description: tingling ?Aggravating factors: looking up ?Relieving factors:  ? ?OBJECTIVE: (objective measures completed at initial evaluation unless otherwise dated) ? ?DIAGNOSTIC FINDINGS:  ?MRI on 02/06/22:  ?1. Multilevel cervical disc degeneration, worst at C5-6 where there  ?is mild-to-moderate spinal stenosis and moderate left greater  than  ?right neural foraminal stenosis.  ?2. Mild spinal stenosis and moderate left foraminal stenosis at  ?C6-7.  ?3. Mild spinal stenosis at C4-5.  ?  ?PATIENT SURVEYS:  ?FOTO 59, predicted 40  ?  ?  ?COGNITION: ?Overall cognitive status: Within functional limits for tasks assessed ?  ?  ?SENSATION: ?WFL ?  ?POSTURE:  ?Rounded shoulders and forward head ?  ?PALPATION: ?No tenderness to palpation      ?  ?CERVICAL ROM:  ?  ?Active ROM A/PROM (deg) ?03/28/2022  ?Flexion 50  ?Extension 43*  ?Right lateral flexion 35  ?Left lateral flexion 35  ?Right rotation 70  ?Left rotation 67*  ? (Blank rows = not tested) ?Key: * = reproduced pain ?UE ROM: ?  ?Active ROM Right ?03/28/2022 Left ?03/28/2022  ?Shoulder flexion      ?Shoulder extension      ?Shoulder abduction      ?Shoulder adduction      ?Shoulder extension      ?Shoulder internal rotation      ?Shoulder external rotation      ?Elbow flexion      ?Elbow extension      ?Wrist flexion      ?Wrist extension      ?Wrist ulnar deviation      ?Wrist radial deviation      ?Wrist pronation      ?Wrist supination      ? (Blank rows = not tested) ?All WNL  ?  ?  UE MMT: ?  ?MMT Right ?03/28/2022 Left ?03/28/2022  ?Shoulder flexion 5 5  ?Shoulder extension 5 5  ?Shoulder abduction 5 5  ?Shoulder adduction      ?Shoulder extension 5 5  ?Shoulder internal rotation 5 5  ?Shoulder external rotation 4 4  ?Middle trapezius 4- 4-  ?Lower trapezius 3+ 3+  ?Elbow flexion 5 5  ?Elbow extension 5 5  ?Wrist flexion      ?Wrist extension      ?Wrist ulnar deviation      ?Wrist radial deviation      ?Wrist pronation      ?Wrist supination      ?Grip strength      ? (Blank rows = not tested) ?  ?CERVICAL SPECIAL TESTS:  ?Neck flexor muscle endurance test: Positive, Upper limb tension test (ULTT): Negative, Spurling's test: Negative, Distraction test: Negative, and Sharp pursor's test: Negative ?Brachial Plexus compression: + left UE  ?  ?FUNCTIONAL TESTS:  ?Dynanometer:  ?Left: 115 lbs  ?Right:  135 lbs  ?  ?  ?  ?TODAY'S TREATMENT:  ?[facility] Adult PT Treatment:                                                DATE: 04/01/22 ?Therapeutic Exercise: ?Chin tucks in supine 2x10 ?Chin tucks with cervical rotation 2x10 ?Supine shoulder horizontal ABD with yellow TB 2x10 ?Prone right shoulder T 2x10 ?Prone right shoulder Y 2x10 ?Seated left upper trap stretch 30 sec x 2 ?Standing lower trap activation with yellow TB and arms at 90/90 until fatigue x2 ?Seated resisted serratus anterior punch with yellow TB 2x15 ?Manual Therapy: ?Manual traction in supine with approximately 15 degrees cervical flexion 5x30 seconds ?  ?  ?PATIENT EDUCATION:  ?Education details: Educated patient regarding HEP and findings in evaluation. ?Person educated: Patient ?Education method: Explanation, Demonstration, Tactile cues, Verbal cues, and Handouts ?Education comprehension: verbalized understanding ?  ?  ?HOME EXERCISE PROGRAM: ?Access Code: [email or web address] ?URL: [email or web address] ?Date: 04/01/2022 ?Prepared by: Edythe Lynn ? ?Exercises ?- Supine Chin Tuck  - 1 x daily - 7 x weekly - 2 sets - 10 reps ?- Seated Assisted Cervical Rotation with Towel  - 1 x daily - 7 x weekly - 1 sets - 10 reps - 5 seconds hold ?- Seated Scapular Retraction  - 1 x daily - 7 x weekly - 2 sets - 10 reps ?- Prone Single Arm Shoulder Horizontal Abduction with Scapular Retraction and [place]  - 1 x daily - 7 x weekly - 2 sets - 10 reps ?- Prone Single Arm Shoulder Y  - 1 x daily - 7 x weekly - 2 sets - 10 reps ?- Seated Upper Trapezius Stretch  - 1 x daily - 7 x weekly - 3 sets - 30 seconds hold ?  ?  ?ASSESSMENT: ?  ?CLINICAL IMPRESSION: ?Patient arrived to PT session with reports of 3/10 left arm pain. He tolerated all exercises without increased pain. He required verbal cueing for correct for with chin tucks in supine and seated. Patient tolerated manual traction with reduced right arm pain reports. He left session with 2/10 pain. Continue with  posterior shoulder strengthening.   ?  ?OBJECTIVE IMPAIRMENTS decreased strength and pain.  ?  ?ACTIVITY LIMITATIONS driving and recreational activities .  ?  ?PERSONAL FACTORS 1-2 comorbidities: hx of migraines and  anxiety  are also affecting patient's functional outcome.  ?  ?  ?REHAB POTENTIAL: Good ?  ?CLINICAL DECISION MAKING: Stable/uncomplicated ?  ?EVALUATION COMPLEXITY: Low ?  ?  ?GOALS: ?  ?SHORT TERM GOALS: Target date: 04/11/2022 ?  ?Patient will be independent with HEP for PT progression. ?Baseline: initial HEP addressed ?Goal status: INITIAL ?  ?  ?  ?LONG TERM GOALS: Target date: 04/25/2022 ?  ?Patient will score 72 on FOTO.  ?Baseline: 59 ?Goal status: INITIAL ?  ?2.  Patient will be able to turn his head to the left approximately 70 degrees to assist with looking over his left shoulder to drive with [id number] left arm pain.  ?Baseline: 15/10 left arm pain per patient report  ?Goal status: INITIAL ?  ?3.  Patient will be able to perform 45 degrees of neck extension without any left arm pain to assist with looking at objects in kitchen cabinet.  ?Baseline: 43 degrees with left arm pain  ?Goal status: INITIAL ?  ?4.  Patient will be able to get back to working out in the gym without and neck or left arm pain.  ?Baseline: no longer working out since pain started  ?Goal status: INITIAL ?  ?5.  Patient will demonstrate >/= 4/5 posterior shoulder strength to assist with improving posture.  ?Baseline: 3+/5 bil low trap  ?Goal status: INITIAL ?  ?PLAN: ?PT FREQUENCY: 2x/week ?  ?PT DURATION: 4 weeks ?  ?PLANNED INTERVENTIONS: Therapeutic exercises, Therapeutic activity, Neuromuscular re-education, Patient/Family education, Joint manipulation, Joint mobilization, Dry Needling, Electrical stimulation, Spinal mobilization, Cryotherapy, Moist heat, Taping, and Manual therapy ?  ?PLAN FOR NEXT SESSION: Postural education and posterior shoulder strengthening. Cervical stretching.  ? ? ? ?Gillermina Phy, PT,  DPT ?04/01/2022, 10:17 AM ? ?   ?

## 2022-04-01 ENCOUNTER — Ambulatory Visit: Payer: BC Managed Care – PPO

## 2022-04-01 DIAGNOSIS — M542 Cervicalgia: Secondary | ICD-10-CM | POA: Diagnosis not present

## 2022-04-01 DIAGNOSIS — M79602 Pain in left arm: Secondary | ICD-10-CM

## 2022-04-01 DIAGNOSIS — M6281 Muscle weakness (generalized): Secondary | ICD-10-CM

## 2022-04-01 DIAGNOSIS — R293 Abnormal posture: Secondary | ICD-10-CM | POA: Diagnosis not present

## 2022-04-01 NOTE — Therapy (Signed)
?OUTPATIENT PHYSICAL THERAPY TREATMENT NOTE ? ? ?Patient Name: Joseph Lindsey ?MRN: [id number] ?DOB:11/20/1971, 51 y.o., male ?Today's Date: 04/04/2022 ? ?PCP: Kristian Covey, MD ?REFERRING PROVIDER: Kristian Covey, MD ? ?END OF SESSION:  ? PT End of Session - 04/04/22 0941   ? ? Visit Number 3   ? Number of Visits 9   ? Date for PT Re-Evaluation 04/25/22   ? Authorization Type BCBS   ? PT Start Time [phone]   ? PT Stop Time 1015   ? PT Time Calculation (min) 38 min   ? Activity Tolerance Patient tolerated treatment well   ? Behavior During Therapy [facility] for tasks assessed/performed   ? ?  ?  ? ?  ? ? ? ?Past Medical History:  ?Diagnosis Date  ? Fissure, anal   ? 2003  ? Migraines   ? Primary thrombocytopenia, unspecified   ? Thyroid disease   ? hypothyroid  ? ?Past Surgical History:  ?Procedure Laterality Date  ? BREATH TEK H PYLORI N/A 11/11/2013  ? Procedure: BREATH TEK H PYLORI;  Surgeon: Beverley Fiedler, MD;  Location: [facility];  Service: Gastroenterology;  Laterality: N/A;  ? ?Patient Active Problem List  ? Diagnosis Date Noted  ? Thrombocytopenia (HCC) 05/31/2010  ? TRANSAMINASES, SERUM, ELEVATED 05/31/2010  ? Hypothyroidism 05/10/2010  ? NECK PAIN, LEFT 05/10/2010  ? ? ?REFERRING DIAG: M47.22 (ICD-10-CM) - Other spondylosis with radiculopathy, cervical region  ? ?THERAPY DIAG:  ?Abnormal posture ? ?Pain in left arm ? ?Cervicalgia ? ?Muscle weakness (generalized) ? ?PERTINENT HISTORY: Gradual onset of neck pain started in February ? ?PRECAUTIONS: None ? ?SUBJECTIVE: "My pain is getting better. Looking up no longer irritates my neck pain." ? ?PAIN:  ?Are you having pain? Yes: NPRS scale: 1/10 ?Pain location: left pointer finger ?Pain description: numb ?Aggravating factors: touching things with that finger ?Relieving factors:  ? ?OBJECTIVE: (objective measures completed at initial evaluation unless otherwise dated) ? ?DIAGNOSTIC FINDINGS:  ?MRI on 02/06/22:  ?1. Multilevel cervical disc degeneration, worst  at C5-6 where there  ?is mild-to-moderate spinal stenosis and moderate left greater than  ?right neural foraminal stenosis.  ?2. Mild spinal stenosis and moderate left foraminal stenosis at  ?C6-7.  ?3. Mild spinal stenosis at C4-5.  ?  ?PATIENT SURVEYS:  ?FOTO 59, predicted 59  ?  ?  ?COGNITION: ?Overall cognitive status: Within functional limits for tasks assessed ?  ?  ?SENSATION: ?WFL ?  ?POSTURE:  ?Rounded shoulders and forward head ?  ?PALPATION: ?No tenderness to palpation      ?  ?CERVICAL ROM:  ?  ?Active ROM A/PROM (deg) ?03/28/2022  ?Flexion 50  ?Extension 43*  ?Right lateral flexion 35  ?Left lateral flexion 35  ?Right rotation 70  ?Left rotation 67*  ? (Blank rows = not tested) ?Key: * = reproduced pain ?UE ROM: ?  ?Active ROM Right ?03/28/2022 Left ?03/28/2022  ?Shoulder flexion      ?Shoulder extension      ?Shoulder abduction      ?Shoulder adduction      ?Shoulder extension      ?Shoulder internal rotation      ?Shoulder external rotation      ?Elbow flexion      ?Elbow extension      ?Wrist flexion      ?Wrist extension      ?Wrist ulnar deviation      ?Wrist radial deviation      ?Wrist pronation      ?  Wrist supination      ? (Blank rows = not tested) ?All WNL  ?  ?UE MMT: ?  ?MMT Right ?03/28/2022 Left ?03/28/2022  ?Shoulder flexion 5 5  ?Shoulder extension 5 5  ?Shoulder abduction 5 5  ?Shoulder adduction      ?Shoulder extension 5 5  ?Shoulder internal rotation 5 5  ?Shoulder external rotation 4 4  ?Middle trapezius 4- 4-  ?Lower trapezius 3+ 3+  ?Elbow flexion 5 5  ?Elbow extension 5 5  ?Wrist flexion      ?Wrist extension      ?Wrist ulnar deviation      ?Wrist radial deviation      ?Wrist pronation      ?Wrist supination      ?Grip strength      ? (Blank rows = not tested) ?  ?CERVICAL SPECIAL TESTS:  ?Neck flexor muscle endurance test: Positive, Upper limb tension test (ULTT): Negative, Spurling's test: Negative, Distraction test: Negative, and Sharp pursor's test: Negative ?Brachial Plexus  compression: + left UE  ?  ?FUNCTIONAL TESTS:  ?Dynanometer:  ?Left: 115 lbs  ?Right: 135 lbs  ?  ?  ?  ?TODAY'S TREATMENT:  ?[facility] Adult PT Treatment:                                                DATE: 04/04/22 ?Therapeutic Exercise: ?Arm bike 4 min on level 4 (2 min BWD/FWD) ?Chin tucks with cervical rotation 2x10 ?Chin tuck with head lift 2x10 ?Supine serratus anterior punches 4 lbs 2x12 each ?Supine shoulder horizontal ABD with red TB 2x10 ?Prone Y 2x10 2# L  UE ?Prone T 2x10 2#L UE ?Standing rows with green TB 5 sec holds 1x15 ?Standing shoulder EXT 1x10 with green TB ?Manual Therapy: ?Manual traction in supine with approximately 15 degrees cervical flexion 5x30 seconds ? ? ?[facility] Adult PT Treatment:                                                DATE: 04/01/22 ?Therapeutic Exercise: ?Chin tucks in supine 2x10 ?Chin tucks with cervical rotation 2x10 ?Supine shoulder horizontal ABD with yellow TB 2x10 ?Prone right shoulder T 2x10 ?Prone right shoulder Y 2x10 ?Seated left upper trap stretch 30 sec x 2 ?Standing lower trap activation with yellow TB and arms at 90/90 until fatigue x2 ?Seated resisted serratus anterior punch with yellow TB 2x15 ?Manual Therapy: ?Manual traction in supine with approximately 15 degrees cervical flexion 5x30 seconds ?  ?  ?PATIENT EDUCATION:  ?Education details: Educated patient regarding HEP and findings in evaluation. Discussed using vegetable cans for prone shoulder exercises for progression for HEP.  ?Person educated: Patient ?Education method: Explanation, Demonstration, Tactile cues, Verbal cues, and Handouts ?Education comprehension: verbalized understanding ?  ?  ?HOME EXERCISE PROGRAM: ?Access Code: [email or web address] ?URL: [email or web address] ?Date: 04/01/2022 ?Prepared by: Johny Shears ? ?Exercises ?- Supine Chin Tuck  - 1 x daily - 7 x weekly - 2 sets - 10 reps ?- Seated Assisted Cervical Rotation with Towel  - 1 x daily - 7 x weekly - 1 sets - 10 reps - 5 seconds  hold ?- Seated Scapular Retraction  - 1 x daily - 7 x weekly - 2  sets - 10 reps ?- Prone Single Arm Shoulder Horizontal Abduction with Scapular Retraction and Palm Down  - 1 x daily - 7 x weekly - 2 sets - 10 reps ?- Prone Single Arm Shoulder Y  - 1 x daily - 7 x weekly - 2 sets - 10 reps ?- Seated Upper Trapezius Stretch  - 1 x daily - 7 x weekly - 3 sets - 30 seconds hold ?  ?  ?ASSESSMENT: ?  ?CLINICAL IMPRESSION: ?Chrissie NoaWilliam arrived to PT session with reports of reduced pain. He tolerated progressed resistance for shoulder strengthening exercises with no increased pain but fatigued. Good form demonstrated with posterior shoulder exercises. Continue to progress shoulder exercises as appropriate.  ?  ?OBJECTIVE IMPAIRMENTS decreased strength and pain.  ?  ?ACTIVITY LIMITATIONS driving and recreational activities .  ?  ?PERSONAL FACTORS 1-2 comorbidities: hx of migraines and anxiety  are also affecting patient's functional outcome.  ?  ?  ?REHAB POTENTIAL: Good ?  ?CLINICAL DECISION MAKING: Stable/uncomplicated ?  ?EVALUATION COMPLEXITY: Low ?  ?  ?GOALS: ?  ?SHORT TERM GOALS: Target date: 04/11/2022 ?  ?Patient will be independent with HEP for PT progression. ?Baseline: initial HEP addressed ?Goal status: INITIAL ?  ?  ?  ?LONG TERM GOALS: Target date: 04/25/2022 ?  ?Patient will score 72 on FOTO.  ?Baseline: 59 ?Goal status: INITIAL ?  ?2.  Patient will be able to turn his head to the left approximately 70 degrees to assist with looking over his left shoulder to drive with <1/61<1/10 left arm pain.  ?Baseline: 15/10 left arm pain per patient report  ?Goal status: INITIAL ?  ?3.  Patient will be able to perform 45 degrees of neck extension without any left arm pain to assist with looking at objects in kitchen cabinet.  ?Baseline: 43 degrees with left arm pain  ?Goal status: INITIAL ?  ?4.  Patient will be able to get back to working out in the gym without and neck or left arm pain.  ?Baseline: no longer working out since  pain started  ?Goal status: INITIAL ?  ?5.  Patient will demonstrate >/= 4/5 posterior shoulder strength to assist with improving posture.  ?Baseline: 3+/5 bil low trap  ?Goal status: INITIAL ?  ?PLAN: ?PT FRE

## 2022-04-03 ENCOUNTER — Other Ambulatory Visit: Payer: Self-pay

## 2022-04-03 ENCOUNTER — Inpatient Hospital Stay: Payer: BC Managed Care – PPO | Attending: Oncology

## 2022-04-03 ENCOUNTER — Inpatient Hospital Stay (HOSPITAL_BASED_OUTPATIENT_CLINIC_OR_DEPARTMENT_OTHER): Payer: BC Managed Care – PPO | Admitting: Oncology

## 2022-04-03 VITALS — BP 124/61 | HR 63 | Temp 97.9°F | Resp 16 | Ht 69.0 in | Wt 171.4 lb

## 2022-04-03 DIAGNOSIS — D696 Thrombocytopenia, unspecified: Secondary | ICD-10-CM | POA: Diagnosis not present

## 2022-04-03 LAB — CBC WITH DIFFERENTIAL (CANCER CENTER ONLY)
Abs Immature Granulocytes: 0.01 10*3/uL (ref 0.00–0.07)
Basophils Absolute: 0 10*3/uL (ref 0.0–0.1)
Basophils Relative: 1 %
Eosinophils Absolute: 0.1 10*3/uL (ref 0.0–0.5)
Eosinophils Relative: 2 %
HCT: 44.9 % (ref 39.0–52.0)
Hemoglobin: 14.7 g/dL (ref 13.0–17.0)
Immature Granulocytes: 0 %
Lymphocytes Relative: 37 %
Lymphs Abs: 1.8 10*3/uL (ref 0.7–4.0)
MCH: 27.7 pg (ref 26.0–34.0)
MCHC: 32.7 g/dL (ref 30.0–36.0)
MCV: 84.6 fL (ref 80.0–100.0)
Monocytes Absolute: 0.6 10*3/uL (ref 0.1–1.0)
Monocytes Relative: 12 %
Neutro Abs: 2.4 10*3/uL (ref 1.7–7.7)
Neutrophils Relative %: 48 %
Platelet Count: 64 10*3/uL — ABNORMAL LOW (ref 150–400)
RBC: 5.31 MIL/uL (ref 4.22–5.81)
RDW: 14.1 % (ref 11.5–15.5)
WBC Count: 5 10*3/uL (ref 4.0–10.5)
nRBC: 0 % (ref 0.0–0.2)

## 2022-04-03 NOTE — Progress Notes (Signed)
Hematology and Oncology Follow Up Visit ? ?Joseph Lindsey ?PB:[id number] ?01-11-1971 51 y.o. ?04/03/2022 3:18 PM ? ?CC: Carolann Littler, M.D.  ? ? ?Principle Diagnosis: 51 year old man with thrombocytopenia diagnosed in 2011.  This is related to chronic ITP versus inherited platelet disorder.   ? ?Current therapy: Active surveillance. ? ?Interim History:   Joseph Lindsey returns today for a follow-up evaluation.  Since the last visit, he reports no major changes in his health.  He denies any recent hospitalizations or illnesses.  He does report numbness in his left index finger and has been diagnosed with cervical spinal stenosis.  He is currently involved in physical therapy.  He denies any hematochezia or melena or hemoptysis.  He denies any hospitalizations or illnesses. ? ? ? ? ?Medications: Updated on review. ?Current Outpatient Medications  ?Medication Sig Dispense Refill  ? fexofenadine (ALLEGRA) 180 MG tablet Take 180 mg by mouth daily.    ? levothyroxine (SYNTHROID) 100 MCG tablet TAKE 1 TABLET DAILY 90 tablet 3  ? predniSONE (DELTASONE) 10 MG tablet Taper as follows over 10 days :   6-6-4-4-3-3-2-2-1-1 32 tablet 0  ? triamcinolone cream (KENALOG) 0.1 % APPLY TOPICALLY TWICE A DAY AS NEEDED 30 g 3  ? ?No current facility-administered medications for this visit.  ? ? ?Allergies: No Known Allergies ? ? ? ? ?Physical Exam: ? ? ? ? ? ? ?Blood pressure 124/61, pulse 63, temperature 97.9 ?F (36.6 ?C), temperature source Temporal, resp. rate 16, height 5\' 9"  (1.753 m), weight 171 lb 6.4 oz (77.7 kg), SpO2 100 %. ? ? ? ? ? ?ECOG: 0 ? ? ? ?General appearance: Comfortable appearing without any discomfort ?Head: Normocephalic without any trauma ?Oropharynx: Mucous membranes are moist and pink without any thrush or ulcers. ?Eyes: Pupils are equal and round reactive to light. ?Lymph nodes: No cervical, supraclavicular, inguinal or axillary lymphadenopathy.   ?Heart:regular rate and rhythm.  S1 and S2 without leg  edema. ?Lung: Clear without any rhonchi or wheezes.  No dullness to percussion. ?Abdomin: Soft, nontender, nondistended with good bowel sounds.  No hepatosplenomegaly. ?Musculoskeletal: No joint deformity or effusion.  Full range of motion noted. ?Neurological: No deficits noted on motor, sensory and deep tendon reflex exam. ?Skin: No petechial rash or dryness.  Appeared moist.  ? ? ? ? ? ? ? ?Lab Results: ?Lab Results  ?Component Value Date  ? WBC 5.3 12/05/2021  ? HGB 15.6 12/05/2021  ? HCT 47.6 12/05/2021  ? MCV 85.0 12/05/2021  ? PLT 41.0 Repeated and verified X2. (LL) 12/05/2021  ? ?  Chemistry   ?   ?Component Value Date/Time  ? NA 137 12/05/2021 0839  ? NA 140 08/17/2015 1114  ? K 4.3 12/05/2021 0839  ? K 4.1 08/17/2015 1114  ? CL 101 12/05/2021 0839  ? CL 106 12/15/2012 0948  ? CO2 29 12/05/2021 0839  ? CO2 26 08/17/2015 1114  ? BUN 11 12/05/2021 0839  ? BUN 10.3 08/17/2015 1114  ? CREATININE 1.09 12/05/2021 0839  ? CREATININE 1.2 08/17/2015 1114  ?    ?Component Value Date/Time  ? CALCIUM 9.3 12/05/2021 0839  ? CALCIUM 9.4 08/17/2015 1114  ? ALKPHOS 69 12/05/2021 0839  ? ALKPHOS 67 08/17/2015 1114  ? AST 39 (H) 12/05/2021 0839  ? AST 24 08/17/2015 1114  ? ALT 37 12/05/2021 0839  ? ALT 30 08/17/2015 1114  ? BILITOT 0.5 12/05/2021 0839  ? BILITOT 0.60 08/17/2015 1114  ?  ? ? ? ? ?Impression and  plan: ? ?51 year old man with: ? ?1.  Chronic thrombocytopenia diagnosed in 2011.  Differential diagnosis that includes ITP versus inherited platelet disorder. ? ?Laboratory data last 6 months were personally reviewed and discussed with the patient's.  He had fluctuating platelet count between 40-60 without any active bleeding.  Management choices at this time were discussed including trial of steroids, IVIG or rituximab versus continued active surveillance. ? ?His CBC from today showed platelet count which is consistent with his baseline around 64 without any active bleeding.  His laboratory testing otherwise is  within normal range. ? ? ? ?2.  Age-appropriate cancer screening: He is up-to-date and his last colonoscopy was 2014. ? ?3.  Follow-up: In 6 months for repeat follow-up. ? ? ?30  minutes were spent on this encounter.  The time was dedicated to reviewing laboratory data, disease status update and future management options.  Stressed ? ? ? ?Zola Button, MD ?4/26/20233:18 PM ?

## 2022-04-04 ENCOUNTER — Ambulatory Visit: Payer: BC Managed Care – PPO

## 2022-04-04 DIAGNOSIS — R293 Abnormal posture: Secondary | ICD-10-CM

## 2022-04-04 DIAGNOSIS — M542 Cervicalgia: Secondary | ICD-10-CM

## 2022-04-04 DIAGNOSIS — M6281 Muscle weakness (generalized): Secondary | ICD-10-CM

## 2022-04-04 DIAGNOSIS — M79602 Pain in left arm: Secondary | ICD-10-CM | POA: Diagnosis not present

## 2022-04-08 ENCOUNTER — Ambulatory Visit: Payer: BC Managed Care – PPO | Admitting: Physical Therapy

## 2022-04-11 ENCOUNTER — Ambulatory Visit: Payer: BC Managed Care – PPO | Attending: Neurosurgery | Admitting: Physical Therapy

## 2022-04-11 ENCOUNTER — Encounter: Payer: Self-pay | Admitting: Physical Therapy

## 2022-04-11 DIAGNOSIS — M542 Cervicalgia: Secondary | ICD-10-CM | POA: Insufficient documentation

## 2022-04-11 DIAGNOSIS — R293 Abnormal posture: Secondary | ICD-10-CM | POA: Diagnosis not present

## 2022-04-11 DIAGNOSIS — M79602 Pain in left arm: Secondary | ICD-10-CM | POA: Insufficient documentation

## 2022-04-11 DIAGNOSIS — M6281 Muscle weakness (generalized): Secondary | ICD-10-CM | POA: Diagnosis not present

## 2022-04-11 NOTE — Therapy (Signed)
?OUTPATIENT PHYSICAL THERAPY TREATMENT NOTE ? ? ?Patient Name: Joseph Lindsey ?MRN: [id number] ?DOB:1971/08/22, 51 y.o., male ?Today's Date: 04/11/2022 ? ?PCP: Eulas Post, MD ?REFERRING PROVIDER: Consuella Lose, MD ? ?END OF SESSION:  ? PT End of Session - 04/11/22 6283   ? ? Visit Number 4   ? Number of Visits 9   ? Date for PT Re-Evaluation 04/25/22   ? Authorization Type BCBS   ? PT Start Time [phone]   ? PT Stop Time 0929   ? PT Time Calculation (min) 40 min   ? ?  ?  ? ?  ? ? ? ?Past Medical History:  ?Diagnosis Date  ? Fissure, anal   ? 2003  ? Migraines   ? Primary thrombocytopenia, unspecified   ? Thyroid disease   ? hypothyroid  ? ?Past Surgical History:  ?Procedure Laterality Date  ? BREATH TEK H PYLORI N/A 11/11/2013  ? Procedure: BREATH TEK H PYLORI;  Surgeon: Jerene Bears, MD;  Location: [facility];  Service: Gastroenterology;  Laterality: N/A;  ? ?Patient Active Problem List  ? Diagnosis Date Noted  ? Thrombocytopenia ([place]) 05/31/2010  ? TRANSAMINASES, SERUM, ELEVATED 05/31/2010  ? Hypothyroidism 05/10/2010  ? NECK PAIN, LEFT 05/10/2010  ? ? ?REFERRING DIAG: M47.22 (ICD-10-CM) - Other spondylosis with radiculopathy, cervical region  ? ?THERAPY DIAG:  ?Abnormal posture ? ?Pain in left arm ? ?Cervicalgia ? ?Muscle weakness (generalized) ? ?PERTINENT HISTORY: Gradual onset of neck pain started in February ? ?PRECAUTIONS: None ? ?SUBJECTIVE: I was sick last week and had some aches and pains in my body and the pain came back in my left arm to my index finger. ? ?PAIN:  ?Are you having pain? Yes: NPRS scale: 5/10 ?Pain location: neck and left arm  ?Pain description: sore ?Aggravating factors: being sick ?Relieving factors: exercises ? ?OBJECTIVE: (objective measures completed at initial evaluation unless otherwise dated) ? ?DIAGNOSTIC FINDINGS:  ?MRI on 02/06/22:  ?1. Multilevel cervical disc degeneration, worst at C5-6 where there  ?is mild-to-moderate spinal stenosis and moderate left greater than   ?right neural foraminal stenosis.  ?2. Mild spinal stenosis and moderate left foraminal stenosis at  ?C6-7.  ?3. Mild spinal stenosis at C4-5.  ?  ?PATIENT SURVEYS:  ?FOTO 59, predicted 12  ?  ?  ?COGNITION: ?Overall cognitive status: Within functional limits for tasks assessed ?  ?  ?SENSATION: ?WFL ?  ?POSTURE:  ?Rounded shoulders and forward head ?  ?PALPATION: ?No tenderness to palpation      ?  ?CERVICAL ROM:  ?  ?Active ROM A/PROM (deg) ?03/28/2022  ?Flexion 50  ?Extension 43*  ?Right lateral flexion 35  ?Left lateral flexion 35  ?Right rotation 70  ?Left rotation 67*  ? (Blank rows = not tested) ?Key: * = reproduced pain ?UE ROM: ?  ?Active ROM Right ?03/28/2022 Left ?03/28/2022  ?Shoulder flexion      ?Shoulder extension      ?Shoulder abduction      ?Shoulder adduction      ?Shoulder extension      ?Shoulder internal rotation      ?Shoulder external rotation      ?Elbow flexion      ?Elbow extension      ?Wrist flexion      ?Wrist extension      ?Wrist ulnar deviation      ?Wrist radial deviation      ?Wrist pronation      ?Wrist supination      ? (  Blank rows = not tested) ?All WNL  ?  ?UE MMT: ?  ?MMT Right ?03/28/2022 Left ?03/28/2022  ?Shoulder flexion 5 5  ?Shoulder extension 5 5  ?Shoulder abduction 5 5  ?Shoulder adduction      ?Shoulder extension 5 5  ?Shoulder internal rotation 5 5  ?Shoulder external rotation 4 4  ?Middle trapezius 4- 4-  ?Lower trapezius 3+ 3+  ?Elbow flexion 5 5  ?Elbow extension 5 5  ?Wrist flexion      ?Wrist extension      ?Wrist ulnar deviation      ?Wrist radial deviation      ?Wrist pronation      ?Wrist supination      ?Grip strength      ? (Blank rows = not tested) ?  ?CERVICAL SPECIAL TESTS:  ?Neck flexor muscle endurance test: Positive, Upper limb tension test (ULTT): Negative, Spurling's test: Negative, Distraction test: Negative, and Sharp pursor's test: Negative ?Brachial Plexus compression: + left UE  ?  ?FUNCTIONAL TESTS:  ?Dynanometer:  ?Left: 115 lbs  ?Right: 135 lbs   ?  ?  ?  ?TODAY'S TREATMENT:  ?[facility] Adult PT Treatment:                                                DATE: 04/11/22 ?Therapeutic Exercise: ?Arm bike 4 min on level 4 (2.5 min BWD/FWD) ?Standing rows with blue TB 5 sec holds 1x15 ?Standing shoulder EXT 1x15 with Blue TB ?Standing Green band ER 1 x 15 with scap retract  ?Standing shoulder horizontal ABD with red TB 2x10 ?Prone Y 2x10 2#  bilat  UE ?Prone T 2x10 2# bilat  UE ?Chin tuck into towel 5 sec x 10 ?Chin tucks with cervical rotation 2x10 ?Supine serratus anterior punches 5 lbs 2x10 each ?Chin tuck with head lift 1x10 ? ?Manual Therapy: ?Manual traction in supine with approximately 15 degrees cervical flexion 5x30 seconds ? ?[facility] Adult PT Treatment:                                                DATE: 04/04/22 ?Therapeutic Exercise: ?Arm bike 4 min on level 4 (2 min BWD/FWD) ?Chin tucks with cervical rotation 2x10 ?Chin tuck with head lift 2x10 ?Supine serratus anterior punches 4 lbs 2x12 each ?Supine shoulder horizontal ABD with red TB 2x10 ?Prone Y 2x10 2# L  UE ?Prone T 2x10 2#L UE ?Standing rows with green TB 5 sec holds 1x15 ?Standing shoulder EXT 1x10 with green TB ?Manual Therapy: ?Manual traction in supine with approximately 15 degrees cervical flexion 5x30 seconds ? ? ?[facility] Adult PT Treatment:                                                DATE: 04/01/22 ?Therapeutic Exercise: ?Chin tucks in supine 2x10 ?Chin tucks with cervical rotation 2x10 ?Supine shoulder horizontal ABD with yellow TB 2x10 ?Prone right shoulder T 2x10 ?Prone right shoulder Y 2x10 ?Seated left upper trap stretch 30 sec x 2 ?Standing lower trap activation with yellow TB and arms at 90/90 until fatigue x2 ?  Seated resisted serratus anterior punch with yellow TB 2x15 ?Manual Therapy: ?Manual traction in supine with approximately 15 degrees cervical flexion 5x30 seconds ?  ?  ?PATIENT EDUCATION:  ?Education details: Educated patient regarding HEP and findings in evaluation. Discussed  using vegetable cans for prone shoulder exercises for progression for HEP.  ?Person educated: Patient ?Education method: Explanation, Demonstration, Tactile cues, Verbal cues, and Handouts ?Education comprehension: verbalized understanding ?  ?  ?HOME EXERCISE PROGRAM: ?Access Code: [email or web address] ?URL: [email or web address] ?Date: 04/01/2022 ?Prepared by: Edythe Lynn ? ?Exercises ?- Supine Chin Tuck  - 1 x daily - 7 x weekly - 2 sets - 10 reps ?- Seated Assisted Cervical Rotation with Towel  - 1 x daily - 7 x weekly - 1 sets - 10 reps - 5 seconds hold ?- Seated Scapular Retraction  - 1 x daily - 7 x weekly - 2 sets - 10 reps ?- Prone Single Arm Shoulder Horizontal Abduction with Scapular Retraction and Palm Down  - 1 x daily - 7 x weekly - 2 sets - 10 reps ?- Prone Single Arm Shoulder Y  - 1 x daily - 7 x weekly - 2 sets - 10 reps ?- Seated Upper Trapezius Stretch  - 1 x daily - 7 x weekly - 3 sets - 30 seconds hold ?  ?  ?ASSESSMENT: ?  ?CLINICAL IMPRESSION: ?Regina arrived to PT session with reports of increased pain since being sick with most pain located in left arm to index finger. Continued with strengthening and stabilization.  He tolerated progressed resistance for shoulder strengthening exercises with no increased pain. He reports independence with HEP. STG #1 Met.  Manual cervical traction relieved arm pain at end of session. Discussed option for home traction devices. Could consider adding mechanical traction to POC.  Good form demonstrated with posterior shoulder exercises. Continue to progress shoulder exercises as appropriate.  ?  ?OBJECTIVE IMPAIRMENTS decreased strength and pain.  ?  ?ACTIVITY LIMITATIONS driving and recreational activities .  ?  ?PERSONAL FACTORS 1-2 comorbidities: hx of migraines and anxiety  are also affecting patient's functional outcome.  ?  ?  ?REHAB POTENTIAL: Good ?  ?CLINICAL DECISION MAKING: Stable/uncomplicated ?  ?EVALUATION COMPLEXITY: Low ?  ?  ?GOALS: ?   ?SHORT TERM GOALS: Target date: 04/11/2022 ?  ?Patient will be independent with HEP for PT progression. ?Baseline: initial HEP addressed ?Goal status: MET 04/11/22 ?  ?  ?  ?LONG TERM GOALS: Target date:

## 2022-04-13 NOTE — Therapy (Incomplete)
?OUTPATIENT PHYSICAL THERAPY TREATMENT NOTE ? ? ?Patient Name: Joseph Lindsey ?MRN: [id number] ?DOB:[birth year], 51 y.o., male ?Today's Date: 04/13/2022 ? ?PCP: Eulas Post, MD ?REFERRING PROVIDER: Consuella Lose, MD ? ?END OF SESSION:  ? ? ? ? ?Past Medical History:  ?Diagnosis Date  ? Fissure, anal   ? 2003  ? Migraines   ? Primary thrombocytopenia, unspecified   ? Thyroid disease   ? hypothyroid  ? ?Past Surgical History:  ?Procedure Laterality Date  ? BREATH TEK H PYLORI N/A 11/11/2013  ? Procedure: BREATH TEK H PYLORI;  Surgeon: Jerene Bears, MD;  Location: [facility];  Service: Gastroenterology;  Laterality: N/A;  ? ?Patient Active Problem List  ? Diagnosis Date Noted  ? Thrombocytopenia ([place]) 05/31/2010  ? TRANSAMINASES, SERUM, ELEVATED 05/31/2010  ? Hypothyroidism 05/10/2010  ? NECK PAIN, LEFT 05/10/2010  ? ? ?REFERRING DIAG: M47.22 (ICD-10-CM) - Other spondylosis with radiculopathy, cervical region  ? ?THERAPY DIAG:  ?No diagnosis found. ? ?PERTINENT HISTORY: Gradual onset of neck pain started in February ? ?PRECAUTIONS: None ? ?SUBJECTIVE: I was sick last week and had some aches and pains in my body and the pain came back in my left arm to my index finger. ? ?PAIN:  ?Are you having pain?  ?Yes: NPRS scale: 5/10 ?Pain location: neck and left arm  ?Pain description: sore ?Aggravating factors: being sick ?Relieving factors: exercises ? ?OBJECTIVE: (objective measures completed at initial evaluation unless otherwise dated) ? ?PATIENT SURVEYS:  ?FOTO 59, predicted 49  ?  ?POSTURE:  ?Rounded shoulders and forward head ? ?CERVICAL ROM:  ?  ?Active ROM A/PROM (deg) ?03/28/2022  ?Flexion 50  ?Extension 43*  ?Right lateral flexion 35  ?Left lateral flexion 35  ?Right rotation 70  ?Left rotation 67*  ? (Blank rows = not tested) ?Key: * = reproduced pain ?UE ROM: ?  ?Active ROM Right ?03/28/2022 Left ?03/28/2022  ?Shoulder flexion      ?Shoulder extension      ?Shoulder abduction      ?Shoulder adduction       ?Shoulder extension      ?Shoulder internal rotation      ?Shoulder external rotation      ?Elbow flexion      ?Elbow extension      ?Wrist flexion      ?Wrist extension      ?Wrist ulnar deviation      ?Wrist radial deviation      ?Wrist pronation      ?Wrist supination      ? (Blank rows = not tested) ?All WNL  ?  ?UE MMT: ?  ?MMT Right ?03/28/2022 Left ?03/28/2022  ?Shoulder flexion 5 5  ?Shoulder extension 5 5  ?Shoulder abduction 5 5  ?Shoulder adduction      ?Shoulder extension 5 5  ?Shoulder internal rotation 5 5  ?Shoulder external rotation 4 4  ?Middle trapezius 4- 4-  ?Lower trapezius 3+ 3+  ?Elbow flexion 5 5  ?Elbow extension 5 5  ?Wrist flexion      ?Wrist extension      ?Wrist ulnar deviation      ?Wrist radial deviation      ?Wrist pronation      ?Wrist supination      ?Grip strength      ? (Blank rows = not tested) ?  ?CERVICAL SPECIAL TESTS:  ?Neck flexor muscle endurance test: Positive, Upper limb tension test (ULTT): Negative, Spurling's test: Negative, Distraction test: Negative, and Sharp pursor's  test: Negative ?Brachial Plexus compression: + left UE  ?  ?FUNCTIONAL TESTS:  ?Dynanometer:  ?Left: 115 lbs  ?Right: 135 lbs  ?  ?   ?TODAY'S TREATMENT:  ?[facility] PT Treatment:                                                DATE: 04/15/22 ?Therapeutic Exercise: ?Arm bike 4 min on level 4 (2.5 min BWD/FWD) ?Standing rows with blue TB 5 sec holds 1x15 ?Standing shoulder EXT 1x15 with Blue TB ?Standing Green band ER 1 x 15 with scap retract  ?Standing shoulder horizontal ABD with red TB 2x10 ?Prone Y 2x10 2#  bilat  UE ?Prone T 2x10 2# bilat  UE ?Chin tuck into towel 5 sec x 10 ?Chin tucks with cervical rotation 2x10 ?Supine serratus anterior punches 5 lbs 2x10 each ?Chin tuck with head lift 1x10 ? ?Manual Therapy: ?Manual traction in supine with approximately 15 degrees cervical flexion 5x30 seconds ? ? ?[facility] Adult PT Treatment:                                                DATE:  04/11/22 ?Therapeutic Exercise: ?Arm bike 4 min on level 4 (2.5 min BWD/FWD) ?Standing rows with blue TB 5 sec holds 1x15 ?Standing shoulder EXT 1x15 with Blue TB ?Standing Green band ER 1 x 15 with scap retract  ?Standing shoulder horizontal ABD with red TB 2x10 ?Prone Y 2x10 2#  bilat  UE ?Prone T 2x10 2# bilat  UE ?Chin tuck into towel 5 sec x 10 ?Chin tucks with cervical rotation 2x10 ?Supine serratus anterior punches 5 lbs 2x10 each ?Chin tuck with head lift 1x10 ?Manual Therapy: ?Manual traction in supine with approximately 15 degrees cervical flexion 5x30 seconds ? ?[facility] Adult PT Treatment:                                                DATE: 04/04/22 ?Therapeutic Exercise: ?Arm bike 4 min on level 4 (2 min BWD/FWD) ?Chin tucks with cervical rotation 2x10 ?Chin tuck with head lift 2x10 ?Supine serratus anterior punches 4 lbs 2x12 each ?Supine shoulder horizontal ABD with red TB 2x10 ?Prone Y 2x10 2# L  UE ?Prone T 2x10 2#L UE ?Standing rows with green TB 5 sec holds 1x15 ?Standing shoulder EXT 1x10 with green TB ?Manual Therapy: ?Manual traction in supine with approximately 15 degrees cervical flexion 5x30 seconds ? ?[facility] Adult PT Treatment:                                                DATE: 04/01/22 ?Therapeutic Exercise: ?Chin tucks in supine 2x10 ?Chin tucks with cervical rotation 2x10 ?Supine shoulder horizontal ABD with yellow TB 2x10 ?Prone right shoulder T 2x10 ?Prone right shoulder Y 2x10 ?Seated left upper trap stretch 30 sec x 2 ?Standing lower trap activation with yellow TB and arms at 90/90 until fatigue x2 ?Seated resisted serratus anterior punch with  yellow TB 2x15 ?Manual Therapy: ?Manual traction in supine with approximately 15 degrees cervical flexion 5x30 seconds ?  ?  ?PATIENT EDUCATION:  ?Education details: HEP ?Person educated: Patient ?Education method: Explanation, Demonstration, Tactile cues, Verbal cues, and Handouts ?Education comprehension: verbalized understanding ?  ?HOME  EXERCISE PROGRAM: ?Access Code: [phone] ?  ?  ?ASSESSMENT: ?CLINICAL IMPRESSION: ?*** ? ?Rohin arrived to PT session with reports of increased pain since being sick with most pain located in left arm to index finger. Continued with strengthening and stabilization.  He tolerated progressed resistance for shoulder strengthening exercises with no increased pain. He reports independence with HEP. STG #1 Met.  Manual cervical traction relieved arm pain at end of session. Discussed option for home traction devices. Could consider adding mechanical traction to POC.  Good form demonstrated with posterior shoulder exercises. Continue to progress shoulder exercises as appropriate.  ?  ?OBJECTIVE IMPAIRMENTS decreased strength and pain.  ?  ?ACTIVITY LIMITATIONS driving and recreational activities .  ?  ?PERSONAL FACTORS 1-2 comorbidities: hx of migraines and anxiety  are also affecting patient's functional outcome.  ?  ?  ?GOALS: ?  ?SHORT TERM GOALS: Target date: 04/11/2022 ?  ?Patient will be independent with HEP for PT progression. ?Baseline: initial HEP addressed ?Goal status: MET 04/11/22 ?  ?  ?  ?LONG TERM GOALS: Target date: 04/25/2022 ?  ?Patient will score 72 on FOTO.  ?Baseline: 59 ?Goal status: INITIAL ?  ?2.  Patient will be able to turn his head to the left approximately 70 degrees to assist with looking over his left shoulder to drive with <7/67 left arm pain.  ?Baseline: 15/10 left arm pain per patient report  ?Goal status: INITIAL ?  ?3.  Patient will be able to perform 45 degrees of neck extension without any left arm pain to assist with looking at objects in kitchen cabinet.  ?Baseline: 43 degrees with left arm pain  ?Goal status: INITIAL ?  ?4.  Patient will be able to get back to working out in the gym without and neck or left arm pain.  ?Baseline: no longer working out since pain started  ?Goal status: INITIAL ?  ?5.  Patient will demonstrate >/= 4/5 posterior shoulder strength to assist with improving  posture.  ?Baseline: 3+/5 bil low trap  ?Goal status: INITIAL ?  ?PLAN: ?PT FREQUENCY: 2x/week ?  ?PT DURATION: 4 weeks ?  ?PLANNED INTERVENTIONS: Therapeutic exercises, Therapeutic activity, Neuromuscular re-education, Patient/F

## 2022-04-15 ENCOUNTER — Telehealth: Payer: Self-pay | Admitting: Physical Therapy

## 2022-04-15 ENCOUNTER — Ambulatory Visit: Payer: BC Managed Care – PPO | Admitting: Physical Therapy

## 2022-04-15 NOTE — Telephone Encounter (Signed)
Attempted to contact patient due to missed PT appointment, left VM informing patient of missed appointment and reminder of next scheduled appointment. Advised patient of attendance policy. ? ? ?Hilda Blades, PT, DPT, LAT, ATC ?04/15/22  4:01 PM ?Phone: [phone] ?Fax: [phone] ? ?

## 2022-04-17 ENCOUNTER — Encounter: Payer: Self-pay | Admitting: Physical Therapy

## 2022-04-17 ENCOUNTER — Ambulatory Visit: Payer: BC Managed Care – PPO | Admitting: Physical Therapy

## 2022-04-17 DIAGNOSIS — M542 Cervicalgia: Secondary | ICD-10-CM | POA: Diagnosis not present

## 2022-04-17 DIAGNOSIS — M79602 Pain in left arm: Secondary | ICD-10-CM | POA: Diagnosis not present

## 2022-04-17 DIAGNOSIS — R293 Abnormal posture: Secondary | ICD-10-CM

## 2022-04-17 DIAGNOSIS — M6281 Muscle weakness (generalized): Secondary | ICD-10-CM | POA: Diagnosis not present

## 2022-04-17 NOTE — Therapy (Signed)
?OUTPATIENT PHYSICAL THERAPY TREATMENT NOTE ? ? ?Patient Name: Joseph Lindsey ?MRN: [id number] ?DOB:12/07/1971, 51 y.o., male ?Today's Date: 04/17/2022 ? ?PCP: Eulas Post, MD ?REFERRING PROVIDER: Consuella Lose, MD ? ?END OF SESSION:  ? PT End of Session - 04/17/22 1019   ? ? Visit Number [phone]   ? Number of Visits 9   ? Date for PT Re-Evaluation 04/25/22   ? Authorization Type [employer]   ? PT Start Time 1017   ? PT Stop Time 1100   ? PT Time Calculation (min) 43 min   ? ?  ?  ? ?  ? ? ? ?Past Medical History:  ?Diagnosis Date  ? Fissure, anal   ? 2003  ? Migraines   ? Primary thrombocytopenia, unspecified   ? Thyroid disease   ? hypothyroid  ? ?Past Surgical History:  ?Procedure Laterality Date  ? BREATH TEK H PYLORI N/A 11/11/2013  ? Procedure: BREATH TEK H PYLORI;  Surgeon: Jerene Bears, MD;  Location: [facility];  Service: Gastroenterology;  Laterality: N/A;  ? ?Patient Active Problem List  ? Diagnosis Date Noted  ? Thrombocytopenia ([place]) 05/31/2010  ? TRANSAMINASES, SERUM, ELEVATED 05/31/2010  ? Hypothyroidism 05/10/2010  ? NECK PAIN, LEFT 05/10/2010  ? ? ?REFERRING DIAG: M47.22 (ICD-10-CM) - Other spondylosis with radiculopathy, cervical region  ? ?THERAPY DIAG:  ?Abnormal posture ? ?Pain in left arm ? ?Cervicalgia ? ?PERTINENT HISTORY: Gradual onset of neck pain started in February ? ?PRECAUTIONS: None ? ?SUBJECTIVE: I played tennis 3 days ago. No pain before or during tennis. After tennis I had pain at 7/10 mostly in left forearm, some in upper arm, none in the shoulder. The pain lasted 2 days. No pain now. I have some pain in my neck now from sleeping on the couch last night.  ? ?PAIN:  ?Are you having pain? Yes: NPRS scale: 0/10 ?Pain location: let arm  ?Pain description: sore ?Aggravating factors: tennis ?Relieving factors: exercises ? ?OBJECTIVE: (objective measures completed at initial evaluation unless otherwise dated) ? ?DIAGNOSTIC FINDINGS:  ?MRI on 02/06/22:  ?1. Multilevel cervical disc  degeneration, worst at C5-6 where there  ?is mild-to-moderate spinal stenosis and moderate left greater than  ?right neural foraminal stenosis.  ?2. Mild spinal stenosis and moderate left foraminal stenosis at  ?C6-7.  ?3. Mild spinal stenosis at C4-5.  ?  ?PATIENT SURVEYS:  ?FOTO 59, predicted 29  ?  ?  ?COGNITION: ?Overall cognitive status: Within functional limits for tasks assessed ?  ?  ?SENSATION: ?WFL ?  ?POSTURE:  ?Rounded shoulders and forward head ?  ?PALPATION: ?No tenderness to palpation      ?  ?CERVICAL ROM:  ?  ?Active ROM A/PROM (deg) ?03/28/2022 AROM ?04/17/22  ?Flexion 50   ?Extension 43* 48  ?Right lateral flexion 35   ?Left lateral flexion 35   ?Right rotation 70 70  ?Left rotation 67* 65*  ? (Blank rows = not tested) ?Key: * = reproduced pain ?UE ROM: ?  ?Active ROM Right ?03/28/2022 Left ?03/28/2022  ?Shoulder flexion      ?Shoulder extension      ?Shoulder abduction      ?Shoulder adduction      ?Shoulder extension      ?Shoulder internal rotation      ?Shoulder external rotation      ?Elbow flexion      ?Elbow extension      ?Wrist flexion      ?Wrist extension      ?  Wrist ulnar deviation      ?Wrist radial deviation      ?Wrist pronation      ?Wrist supination      ? (Blank rows = not tested) ?All WNL  ?  ?UE MMT: ?  ?MMT Right ?03/28/2022 Left ?03/28/2022  ?Shoulder flexion 5 5  ?Shoulder extension 5 5  ?Shoulder abduction 5 5  ?Shoulder adduction      ?Shoulder extension 5 5  ?Shoulder internal rotation 5 5  ?Shoulder external rotation 4 4  ?Middle trapezius 4- 4-  ?Lower trapezius 3+ 3+  ?Elbow flexion 5 5  ?Elbow extension 5 5  ?Wrist flexion      ?Wrist extension      ?Wrist ulnar deviation      ?Wrist radial deviation      ?Wrist pronation      ?Wrist supination      ?Grip strength      ? (Blank rows = not tested) ?  ?CERVICAL SPECIAL TESTS:  ?Neck flexor muscle endurance test: Positive, Upper limb tension test (ULTT): Negative, Spurling's test: Negative, Distraction test: Negative, and  Sharp pursor's test: Negative ?Brachial Plexus compression: + left UE  ?  ?FUNCTIONAL TESTS:  ?Dynanometer:  ?Left: 115 lbs  ?Right: 135 lbs  ?  ?  ?  ?TODAY'S TREATMENT:  ?[facility] Adult PT Treatment:                                                DATE: 04/17/22 ?Therapeutic Exercise: ?Arm bike 6 min on level 4 (3 min BWD/FWD) ?Standing rows with blue TB 5 sec holds 1x15 ?Standing shoulder EXT 1x15 with Blue TB ?Standing shoulder diagonals Blue x 15  ?Standing Blue band ER 1 x 15 with scap retract  ? ?Prone Y 2x10 3#  bilat  UE ?Prone T 2x10 3# bilat  UE ? ? ?Manual Therapy: ?Manual traction in supine with approximately 15 degrees cervical flexion 5x30 seconds ?Passive side bend and levator stretches  ? ?[facility] Adult PT Treatment:                                                DATE: 04/11/22 ?Therapeutic Exercise: ?Arm bike 4 min on level 4 (2.5 min BWD/FWD) ?Standing rows with blue TB 5 sec holds 1x15 ?Standing shoulder EXT 1x15 with Blue TB ?Standing Green band ER 1 x 15 with scap retract  ?Standing shoulder horizontal ABD with red TB 2x10 ?Prone Y 2x10 2#  bilat  UE ?Prone T 2x10 2# bilat  UE ?Chin tuck into towel 5 sec x 10 ?Chin tucks with cervical rotation 2x10 ?Supine serratus anterior punches 5 lbs 2x10 each ?Chin tuck with head lift 1x10 ? ?Manual Therapy: ?Manual traction in supine with approximately 15 degrees cervical flexion 5x30 seconds ? ?[facility] Adult PT Treatment:                                                DATE: 04/04/22 ?Therapeutic Exercise: ?Arm bike 4 min on level 4 (2 min BWD/FWD) ?Chin tucks with cervical rotation 2x10 ?Chin tuck with  head lift 2x10 ?Supine serratus anterior punches 4 lbs 2x12 each ?Supine shoulder horizontal ABD with red TB 2x10 ?Prone Y 2x10 2# L  UE ?Prone T 2x10 2#L UE ?Standing rows with green TB 5 sec holds 1x15 ?Standing shoulder EXT 1x10 with green TB ?Manual Therapy: ?Manual traction in supine with approximately 15 degrees cervical flexion 5x30 seconds ? ? ?[facility] Adult PT  Treatment:                                                DATE: 04/01/22 ?Therapeutic Exercise: ?Chin tucks in supine 2x10 ?Chin tucks with cervical rotation 2x10 ?Supine shoulder horizontal ABD with yellow TB 2x10 ?Prone right shoulder T 2x10 ?Prone right shoulder Y 2x10 ?Seated left upper trap stretch 30 sec x 2 ?Standing lower trap activation with yellow TB and arms at 90/90 until fatigue x2 ?Seated resisted serratus anterior punch with yellow TB 2x15 ?Manual Therapy: ?Manual traction in supine with approximately 15 degrees cervical flexion 5x30 seconds ?  ?  ?PATIENT EDUCATION:  ?Education details: Educated patient regarding HEP and findings in evaluation. Discussed using vegetable cans for prone shoulder exercises for progression for HEP.  ?Person educated: Patient ?Education method: Explanation, Demonstration, Tactile cues, Verbal cues, and Handouts ?Education comprehension: verbalized understanding ?  ?  ?HOME EXERCISE PROGRAM: ?Access Code: [email or web address] ?URL: [email or web address] ?Date: 04/01/2022 ?Prepared by: Edythe Lynn ? ?Exercises ?- Supine Chin Tuck  - 1 x daily - 7 x weekly - 2 sets - 10 reps ?- Seated Assisted Cervical Rotation with Towel  - 1 x daily - 7 x weekly - 1 sets - 10 reps - 5 seconds hold ?- Seated Scapular Retraction  - 1 x daily - 7 x weekly - 2 sets - 10 reps ?- Prone Single Arm Shoulder Horizontal Abduction with Scapular Retraction and Palm Down  - 1 x daily - 7 x weekly - 2 sets - 10 reps ?- Prone Single Arm Shoulder Y  - 1 x daily - 7 x weekly - 2 sets - 10 reps ?- Seated Upper Trapezius Stretch  - 1 x daily - 7 x weekly - 3 sets - 30 seconds hold ?  ?  ?ASSESSMENT: ?  ?CLINICAL IMPRESSION: ?Jeron arrived to PT session with reports of overall improvement. He was having no pain and then played tennis 3 days ago which afterward he felt 7/10 arm pain which lasted 2 days. He has no arm pain today but does have a "crick: in his neck from sleeping on the couch last night.  His AROM for neck extension has improved and he has met his FOTO predicted goal. LTG# 1 , 3 met. His is progressing toward remaining LTGs. Will continue to progress shoulder exercises as appropriate.  ?  ?OBJECT

## 2022-04-22 ENCOUNTER — Ambulatory Visit: Payer: BC Managed Care – PPO | Admitting: Physical Therapy

## 2022-04-22 ENCOUNTER — Encounter: Payer: Self-pay | Admitting: Physical Therapy

## 2022-04-22 DIAGNOSIS — M542 Cervicalgia: Secondary | ICD-10-CM | POA: Diagnosis not present

## 2022-04-22 DIAGNOSIS — M79602 Pain in left arm: Secondary | ICD-10-CM | POA: Diagnosis not present

## 2022-04-22 DIAGNOSIS — M6281 Muscle weakness (generalized): Secondary | ICD-10-CM | POA: Diagnosis not present

## 2022-04-22 DIAGNOSIS — R293 Abnormal posture: Secondary | ICD-10-CM

## 2022-04-22 NOTE — Therapy (Signed)
?OUTPATIENT PHYSICAL THERAPY TREATMENT NOTE ? ? ?Patient Name: Joseph Lindsey ?MRN: [id number] ?DOB:1971-10-29, 51 y.o., male ?Today's Date: 04/22/2022 ? ?PCP: Kristian CoveyBurchette, Bruce W, MD ?REFERRING PROVIDER: Lisbeth RenshawNundkumar, Neelesh, MD ? ?END OF SESSION:  ? PT End of Session - 04/22/22 0853   ? ? Visit Number 6   ? Number of Visits 9   ? Date for PT Re-Evaluation 04/25/22   ? Authorization Type BCBS   ? PT Start Time [phone]   ? PT Stop Time 0930   ? PT Time Calculation (min) 40 min   ? ?  ?  ? ?  ? ? ? ?Past Medical History:  ?Diagnosis Date  ? Fissure, anal   ? 2003  ? Migraines   ? Primary thrombocytopenia, unspecified   ? Thyroid disease   ? hypothyroid  ? ?Past Surgical History:  ?Procedure Laterality Date  ? BREATH TEK H PYLORI N/A 11/11/2013  ? Procedure: BREATH TEK H PYLORI;  Surgeon: Beverley FiedlerJay M Pyrtle, MD;  Location: [facility];  Service: Gastroenterology;  Laterality: N/A;  ? ?Patient Active Problem List  ? Diagnosis Date Noted  ? Thrombocytopenia (HCC) 05/31/2010  ? TRANSAMINASES, SERUM, ELEVATED 05/31/2010  ? Hypothyroidism 05/10/2010  ? NECK PAIN, LEFT 05/10/2010  ? ? ?REFERRING DIAG: M47.22 (ICD-10-CM) - Other spondylosis with radiculopathy, cervical region  ? ?THERAPY DIAG:  ?Abnormal posture ? ?Pain in left arm ? ?Cervicalgia ? ?PERTINENT HISTORY: Gradual onset of neck pain started in February ? ?PRECAUTIONS: None ? ?SUBJECTIVE: I woke up with pain yesterday in left upper trap and arm. I do not think I did anything to aggravate it. I have not played tennis. The pain yesterday was 7-8/10. Today it is a 5/10. ? ?PAIN:  ?Are you having pain? Yes: NPRS scale: 5/10 ?Pain location: let arm  ?Pain description: sore ?Aggravating factors: tennis ?Relieving factors: exercises ? ?OBJECTIVE: (objective measures completed at initial evaluation unless otherwise dated) ? ?DIAGNOSTIC FINDINGS:  ?MRI on 02/06/22:  ?1. Multilevel cervical disc degeneration, worst at C5-6 where there  ?is mild-to-moderate spinal stenosis and moderate  left greater than  ?right neural foraminal stenosis.  ?2. Mild spinal stenosis and moderate left foraminal stenosis at  ?C6-7.  ?3. Mild spinal stenosis at C4-5.  ?  ?PATIENT SURVEYS:  ?FOTO 59, predicted 72 % ?FOTO status 04/17/22: 73% ?  ?  ?COGNITION: ?Overall cognitive status: Within functional limits for tasks assessed ?  ?  ?SENSATION: ?WFL ?  ?POSTURE:  ?Rounded shoulders and forward head ?  ?PALPATION: ?No tenderness to palpation      ?  ?CERVICAL ROM:  ?  ?Active ROM A/PROM (deg) ?03/28/2022 AROM ?04/17/22  ?Flexion 50   ?Extension 43* 48  ?Right lateral flexion 35   ?Left lateral flexion 35   ?Right rotation 70 70  ?Left rotation 67* 65*  ? (Blank rows = not tested) ?Key: * = reproduced pain ?UE ROM: ?  ?Active ROM Right ?03/28/2022 Left ?03/28/2022  ?Shoulder flexion      ?Shoulder extension      ?Shoulder abduction      ?Shoulder adduction      ?Shoulder extension      ?Shoulder internal rotation      ?Shoulder external rotation      ?Elbow flexion      ?Elbow extension      ?Wrist flexion      ?Wrist extension      ?Wrist ulnar deviation      ?Wrist radial deviation      ?  Wrist pronation      ?Wrist supination      ? (Blank rows = not tested) ?All WNL  ?  ?UE MMT: ?  ?MMT Right ?03/28/2022 Left ?03/28/2022  ?Shoulder flexion 5 5  ?Shoulder extension 5 5  ?Shoulder abduction 5 5  ?Shoulder adduction      ?Shoulder extension 5 5  ?Shoulder internal rotation 5 5  ?Shoulder external rotation 4 4  ?Middle trapezius 4- 4-  ?Lower trapezius 3+ 3+  ?Elbow flexion 5 5  ?Elbow extension 5 5  ?Wrist flexion      ?Wrist extension      ?Wrist ulnar deviation      ?Wrist radial deviation      ?Wrist pronation      ?Wrist supination      ?Grip strength      ? (Blank rows = not tested) ?  ?CERVICAL SPECIAL TESTS:  ?Neck flexor muscle endurance test: Positive, Upper limb tension test (ULTT): Negative, Spurling's test: Negative, Distraction test: Negative, and Sharp pursor's test: Negative ?Brachial Plexus compression: + left  UE  ?  ?FUNCTIONAL TESTS:  ?Dynanometer:  ?Left: 115 lbs  ?Right: 135 lbs  ?  ?  ?  ?TODAY'S TREATMENT:  ?[facility] Adult PT Treatment:                                                DATE: 04/22/22 ?Therapeutic Exercise: ?Arm bike 6 min on level 4 (3 min BWD/FWD) ?Standing rows with blue TB 5 sec holds 1x15 ?Standing shoulder EXT 1x15 with Blue TB ?Standing shoulder diagonals Blue x 15  ?Standing Blue band ER 1 x 15 with scap retract  ?Upper trap and levator stretches ?Trigger Point Dry-Needling  ?Treatment instructions: Expect mild to moderate muscle soreness. S/S of pneumothorax if dry needled over a lung field, and to seek immediate medical attention should they occur. Patient verbalized understanding of these instructions and education. ? ?Patient Consent Given: Yes ?Education handout provided: Yes ?Muscles treated: Left upper trap ?Electrical stimulation performed: No ?Parameters: N/A ?Treatment response/outcome: twitch elicited, palpable muscle lengthening  ? ? ? ?[facility] Adult PT Treatment:                                                DATE: 04/17/22 ?Therapeutic Exercise: ?Arm bike 6 min on level 4 (3 min BWD/FWD) ?Standing rows with blue TB 5 sec holds 1x15 ?Standing shoulder EXT 1x15 with Blue TB ?Standing shoulder diagonals Blue x 15  ?Standing Blue band ER 1 x 15 with scap retract  ? ?Prone Y 2x10 3#  bilat  UE ?Prone T 2x10 3# bilat  UE ? ? ?Manual Therapy: ?Manual traction in supine with approximately 15 degrees cervical flexion 5x30 seconds ?Passive side bend and levator stretches  ? ?[facility] Adult PT Treatment:                                                DATE: 04/11/22 ?Therapeutic Exercise: ?Arm bike 4 min on level 4 (2.5 min BWD/FWD) ?Standing rows with blue TB 5 sec holds 1x15 ?Standing  shoulder EXT 1x15 with Blue TB ?Standing Green band ER 1 x 15 with scap retract  ?Standing shoulder horizontal ABD with red TB 2x10 ?Prone Y 2x10 2#  bilat  UE ?Prone T 2x10 2# bilat  UE ?Chin tuck into towel 5 sec x  10 ?Chin tucks with cervical rotation 2x10 ?Supine serratus anterior punches 5 lbs 2x10 each ?Chin tuck with head lift 1x10 ? ?Manual Therapy: ?Manual traction in supine with approximately 15 degrees cervical flexion 5x30 seconds ? ?[facility] Adult PT Treatment:                                                DATE: 04/04/22 ?Therapeutic Exercise: ?Arm bike 4 min on level 4 (2 min BWD/FWD) ?Chin tucks with cervical rotation 2x10 ?Chin tuck with head lift 2x10 ?Supine serratus anterior punches 4 lbs 2x12 each ?Supine shoulder horizontal ABD with red TB 2x10 ?Prone Y 2x10 2# L  UE ?Prone T 2x10 2#L UE ?Standing rows with green TB 5 sec holds 1x15 ?Standing shoulder EXT 1x10 with green TB ?Manual Therapy: ?Manual traction in supine with approximately 15 degrees cervical flexion 5x30 seconds ? ? ?[facility] Adult PT Treatment:                                                DATE: 04/01/22 ?Therapeutic Exercise: ?Chin tucks in supine 2x10 ?Chin tucks with cervical rotation 2x10 ?Supine shoulder horizontal ABD with yellow TB 2x10 ?Prone right shoulder T 2x10 ?Prone right shoulder Y 2x10 ?Seated left upper trap stretch 30 sec x 2 ?Standing lower trap activation with yellow TB and arms at 90/90 until fatigue x2 ?Seated resisted serratus anterior punch with yellow TB 2x15 ?Manual Therapy: ?Manual traction in supine with approximately 15 degrees cervical flexion 5x30 seconds ?  ?  ?PATIENT EDUCATION:  ?Education details: TPDN handout  ?Person educated: Patient ?Education method: Explanation, Demonstration, Tactile cues, Verbal cues, and Handouts ?Education comprehension: verbalized understanding ?  ?  ?HOME EXERCISE PROGRAM: ?Access Code: [email or web address] ?URL: [email or web address] ?Date: 04/01/2022 ?Prepared by: Johny Shears ? ?Exercises ?- Supine Chin Tuck  - 1 x daily - 7 x weekly - 2 sets - 10 reps ?- Seated Assisted Cervical Rotation with Towel  - 1 x daily - 7 x weekly - 1 sets - 10 reps - 5 seconds hold ?- Seated Scapular  Retraction  - 1 x daily - 7 x weekly - 2 sets - 10 reps ?- Prone Single Arm Shoulder Horizontal Abduction with Scapular Retraction and Palm Down  - 1 x daily - 7 x weekly - 2 sets - 10 reps ?- Prone Single

## 2022-04-22 NOTE — Patient Instructions (Signed)

## 2022-04-22 NOTE — Therapy (Signed)
?OUTPATIENT PHYSICAL THERAPY TREATMENT NOTE ? ?DISCHARGE ? ? ?Patient Name: Joseph Lindsey ?MRN: [id number] ?DOB:Apr 17, 1971, 51 y.o., male ?Today's Date: 04/24/2022 ? ?PCP: Eulas Post, MD ?REFERRING PROVIDER: Consuella Lose, MD ? ?END OF SESSION:  ? PT End of Session - 04/24/22 1030   ? ? Visit Number 7   ? Number of Visits 9   ? Date for PT Re-Evaluation 04/25/22   ? Authorization Type BCBS   ? PT Start Time [phone]   ? PT Stop Time 0955   ? PT Time Calculation (min) 39 min   ? Activity Tolerance Patient tolerated treatment well   ? Behavior During Therapy [facility] for tasks assessed/performed   ? ?  ?  ? ?  ? ? ? ? ?Past Medical History:  ?Diagnosis Date  ? Fissure, anal   ? 2003  ? Migraines   ? Primary thrombocytopenia, unspecified   ? Thyroid disease   ? hypothyroid  ? ?Past Surgical History:  ?Procedure Laterality Date  ? BREATH TEK H PYLORI N/A 11/11/2013  ? Procedure: BREATH TEK H PYLORI;  Surgeon: Jerene Bears, MD;  Location: [facility];  Service: Gastroenterology;  Laterality: N/A;  ? ?Patient Active Problem List  ? Diagnosis Date Noted  ? Thrombocytopenia ([place]) 05/31/2010  ? TRANSAMINASES, SERUM, ELEVATED 05/31/2010  ? Hypothyroidism 05/10/2010  ? NECK PAIN, LEFT 05/10/2010  ? ? ?REFERRING DIAG: M47.22 (ICD-10-CM) - Other spondylosis with radiculopathy, cervical region  ? ?THERAPY DIAG:  ?Abnormal posture ? ?Pain in left arm ? ?Cervicalgia ? ?Muscle weakness (generalized) ? ?PERTINENT HISTORY: Gradual onset of neck pain started in February ? ?PRECAUTIONS: None ? ?SUBJECTIVE: Patient reports it feels like it is too early for him to tell how he did with the dry needling. He is having no pain this visit. He has not tried playing tennis yet. ? ?PAIN:  ?Are you having pain? No:  ?NPRS scale: 0/10 ?Pain location: Neck and left arm  ?Pain description: sore ?Aggravating factors: tennis ?Relieving factors: exercises ? ?OBJECTIVE: (objective measures completed at initial evaluation unless otherwise  dated) ? ?DIAGNOSTIC FINDINGS:  ?MRI on 02/06/22:  ?1. Multilevel cervical disc degeneration, worst at C5-6 where there  ?is mild-to-moderate spinal stenosis and moderate left greater than  ?right neural foraminal stenosis.  ?2. Mild spinal stenosis and moderate left foraminal stenosis at  ?C6-7.  ?3. Mild spinal stenosis at C4-5.  ?  ?PATIENT SURVEYS:  ?FOTO 59, predicted 72 % ?FOTO status 04/17/22: 73% ? ?POSTURE:  ?Rounded shoulders and forward head ?  ?PALPATION: ?No tenderness to palpation      ?  ?CERVICAL ROM:  ?  ?Active ROM A/PROM (deg) ?03/28/2022 AROM ?04/17/22  ?04/24/2022  ?Flexion 50  60  ?Extension 43* 48 50  ?Right lateral flexion 35  40  ?Left lateral flexion 35  40  ?Right rotation 70 70 70  ?Left rotation 67* 65* 72  ?Key: * = reproduced pain ?  ?UE MMT: ?  ?MMT Right ?03/28/2022 Left ?03/28/2022  ?04/24/2022  ?Shoulder flexion 5 5   ?Shoulder extension 5 5   ?Shoulder abduction 5 5   ?Shoulder adduction       ?Shoulder extension 5 5   ?Shoulder internal rotation 5 5   ?Shoulder external rotation 4 4 4+  ?Middle trapezius 4- 4- 4+  ?Lower trapezius 3+ 3+ 4+  ?Elbow flexion 5 5   ?Elbow extension 5 5   ?Wrist flexion       ?Wrist extension       ?  Wrist ulnar deviation       ?Wrist radial deviation       ?Wrist pronation       ?Wrist supination       ?Grip strength       ?  (Blank rows = not tested) ?  ?CERVICAL SPECIAL TESTS:  ?All negative - 04/24/2022 ?  ?FUNCTIONAL TESTS:  ?Dynanometer:  ?Left: 115 lbs  ?04/24/2022: 133 ?Right: 135 lbs  ?  ?  ?TODAY'S TREATMENT:  ?[facility] Adult PT Treatment:                                                DATE: 04/24/22 ?Therapeutic Exercise: ?UBE L1 x 3 min (fwd/bwd) while taking subjective ?Seated upper trap and levator scap stretch 2 x 20 sec each ?Seated chin tuck 10 x 5 sec ?Cervical rotation SNAG x 5 each ?Prone T and Y with 2# 10 x 3 sec hold each ?Row with blue x 10 ?Extension and scap retraction with blue x 10 ?Diagonals with blue x 10 each ?Scaption with 2# x  10 ? ? ?[facility] Adult PT Treatment:                                                DATE: 04/22/22 ?Therapeutic Exercise: ?Arm bike 6 min on level 4 (3 min BWD/FWD) ?Standing rows with blue TB 5 sec holds 1x15 ?Standing shoulder EXT 1x15 with Blue TB ?Standing shoulder diagonals Blue x 15  ?Standing Blue band ER 1 x 15 with scap retract  ?Upper trap and levator stretches ?Trigger Point Dry-Needling  ?Treatment instructions: Expect mild to moderate muscle soreness. S/S of pneumothorax if dry needled over a lung field, and to seek immediate medical attention should they occur. Patient verbalized understanding of these instructions and education. ? ?Patient Consent Given: Yes ?Education handout provided: Yes ?Muscles treated: Left upper trap ?Electrical stimulation performed: No ?Parameters: N/A ?Treatment response/outcome: twitch elicited, palpable muscle lengthening  ? ?[facility] Adult PT Treatment:                                                DATE: 04/17/22 ?Therapeutic Exercise: ?Arm bike 6 min on level 4 (3 min BWD/FWD) ?Standing rows with blue TB 5 sec holds 1x15 ?Standing shoulder EXT 1x15 with Blue TB ?Standing shoulder diagonals Blue x 15  ?Standing Blue band ER 1 x 15 with scap retract  ?Prone Y 2x10 3#  bilat  UE ?Prone T 2x10 3# bilat  UE ?Manual Therapy: ?Manual traction in supine with approximately 15 degrees cervical flexion 5x30 seconds ?Passive side bend and levator stretches  ?  ?PATIENT EDUCATION:  ?Education details: POC discharge, HEP finialization ?Person educated: Patient ?Education method: Explanation ?Education comprehension: Verbalized understanding ?  ?HOME EXERCISE PROGRAM: ?Access Code: [phone] ?  ?  ?ASSESSMENT: ?CLINICAL IMPRESSION: ?Patient tolerated therapy well with no adverse effects. He has achieved all established goals and reports no current pain. Patient states he has not tried playing tennis yet, but this is not related to his neck or arm pain. He is independent with his HEP and  feels  that he can continue with his exercises to maintain current progress. Patient will be formally discharged from PT. ?  ?OBJECTIVE IMPAIRMENTS decreased strength and pain.  ?  ?ACTIVITY LIMITATIONS driving and recreational activities .  ?  ?PERSONAL FACTORS 1-2 comorbidities: hx of migraines and anxiety  are also affecting patient's functional outcome.  ?  ?  ?GOALS: ?  ?SHORT TERM GOALS: Target date: 04/11/2022 ?  ?Patient will be independent with HEP for PT progression. ?Baseline: initial HEP addressed ?Goal status: MET 04/11/22 ?  ?  ?LONG TERM GOALS: Target date: 04/25/2022 ?  ?Patient will score 72 on FOTO.  ?Baseline: 59 ?Status: 73% 04/17/22 ?Goal status: MET ?  ?2.  Patient will be able to turn his head to the left approximately 70 degrees to assist with looking over his left shoulder to drive with <3/76 left arm pain.  ?Baseline: 15/10 left arm pain per patient report  ?Status 04/17/22: left sided neck and scap pain with left rotation to 65 ?04/24/2022: 70 deg without pain ?Goal status: MET ?  ?3.  Patient will be able to perform 45 degrees of neck extension without any left arm pain to assist with looking at objects in kitchen cabinet.  ?Baseline: 43 degrees with left arm pain  ?Status: 45 degrees without pain ?04/24/2022: 50 deg, no pain ?Goal status: MET ?  ?4.  Patient will be able to get back to working out in the gym without and neck or left arm pain.  ?Baseline: no longer working out since pain started  ?Status 04/17/22- played tennis and experienced increased arm pain 7/10 ?04/24/2022: patient denies neck or shoulder pain this visit ?Goal status: MET ?  ?5.  Patient will demonstrate >/= 4/5 posterior shoulder strength to assist with improving posture.  ?Baseline: 3+/5 bil low trap  ?04/24/2022: grossly 4+/5 MMT ?Goal status: MET ?  ? ?PLAN: ?PT FREQUENCY: 2x/week ?  ?PT DURATION: 4 weeks ?  ?PLANNED INTERVENTIONS: Therapeutic exercises, Therapeutic activity, Neuromuscular re-education, Patient/Family education,  Joint manipulation, Joint mobilization, Dry Needling, Electrical stimulation, Spinal mobilization, Cryotherapy, Moist heat, Taping, and Manual therapy ?  ?PLAN FOR NEXT SESSION: NA - discharge ? ? ?Hilda Blades, PT,

## 2022-04-24 ENCOUNTER — Encounter: Payer: Self-pay | Admitting: Physical Therapy

## 2022-04-24 ENCOUNTER — Ambulatory Visit: Payer: BC Managed Care – PPO | Admitting: Physical Therapy

## 2022-04-24 ENCOUNTER — Other Ambulatory Visit: Payer: Self-pay

## 2022-04-24 DIAGNOSIS — M6281 Muscle weakness (generalized): Secondary | ICD-10-CM | POA: Diagnosis not present

## 2022-04-24 DIAGNOSIS — M79602 Pain in left arm: Secondary | ICD-10-CM

## 2022-04-24 DIAGNOSIS — R293 Abnormal posture: Secondary | ICD-10-CM

## 2022-04-24 DIAGNOSIS — M542 Cervicalgia: Secondary | ICD-10-CM | POA: Diagnosis not present

## 2022-04-24 NOTE — Patient Instructions (Signed)
Access Code: [email or web address] ?URL: [email or web address] ?Date: 04/24/2022 ?Prepared by: Hilda Blades ? ?Exercises ?- Seated Cervical Sidebending Stretch  - 1-2 x daily - 3 reps - 15-20 seconds hold ?- Seated Levator Scapulae Stretch  - 1-2 x daily - 3 reps - 15-20 seconds hold ?- Seated Neck Retraction  - 1-2 x daily - 10 reps - 5 seconds hold ?- Seated Assisted Cervical Rotation with Towel  - 2 sets - 10 reps ?- Prone Single Arm Shoulder Horizontal Abduction with Scapular Retraction and Palm Down  - 1 x daily - 2 sets - 10 reps - 3 seconds hold ?- Prone Single Arm Shoulder Y  - 1 x daily - 2 sets - 10 reps - 3 seconds hold ?- Banded Row  - 1 x daily - 2 sets - 10 reps ?- Scapular Retraction with Resistance Advanced  - 1 x daily - 2 sets - 10 reps ?- Standing Shoulder Diagonal Horizontal Abduction 60/120 Degrees with Resistance  - 1 x daily - 2 sets - 10 reps ?- Scaption with Dumbbells  - 1 x daily - 2 sets - 10 reps ?

## 2022-07-03 ENCOUNTER — Telehealth: Payer: Self-pay | Admitting: Oncology

## 2022-07-03 NOTE — Telephone Encounter (Signed)
Called patient regarding upcoming October appointments, patient has been called and notified. 

## 2022-09-12 ENCOUNTER — Other Ambulatory Visit: Payer: Self-pay | Admitting: Family Medicine

## 2022-10-02 ENCOUNTER — Other Ambulatory Visit: Payer: Self-pay

## 2022-10-02 ENCOUNTER — Inpatient Hospital Stay: Payer: BC Managed Care – PPO | Attending: Oncology

## 2022-10-02 ENCOUNTER — Inpatient Hospital Stay (HOSPITAL_BASED_OUTPATIENT_CLINIC_OR_DEPARTMENT_OTHER): Payer: BC Managed Care – PPO | Admitting: Oncology

## 2022-10-02 VITALS — BP 127/81 | HR 50 | Temp 99.0°F | Wt 168.3 lb

## 2022-10-02 DIAGNOSIS — D696 Thrombocytopenia, unspecified: Secondary | ICD-10-CM | POA: Insufficient documentation

## 2022-10-02 DIAGNOSIS — Z79890 Hormone replacement therapy (postmenopausal): Secondary | ICD-10-CM | POA: Insufficient documentation

## 2022-10-02 LAB — CBC WITH DIFFERENTIAL (CANCER CENTER ONLY)
Abs Immature Granulocytes: 0.01 10*3/uL (ref 0.00–0.07)
Basophils Absolute: 0 10*3/uL (ref 0.0–0.1)
Basophils Relative: 1 %
Eosinophils Absolute: 0 10*3/uL (ref 0.0–0.5)
Eosinophils Relative: 0 %
HCT: 47.3 % (ref 39.0–52.0)
Hemoglobin: 15.7 g/dL (ref 13.0–17.0)
Immature Granulocytes: 0 %
Lymphocytes Relative: 44 %
Lymphs Abs: 2 10*3/uL (ref 0.7–4.0)
MCH: 27.7 pg (ref 26.0–34.0)
MCHC: 33.2 g/dL (ref 30.0–36.0)
MCV: 83.4 fL (ref 80.0–100.0)
Monocytes Absolute: 0.4 10*3/uL (ref 0.1–1.0)
Monocytes Relative: 9 %
Neutro Abs: 2.1 10*3/uL (ref 1.7–7.7)
Neutrophils Relative %: 46 %
Platelet Count: 58 10*3/uL — ABNORMAL LOW (ref 150–400)
RBC: 5.67 MIL/uL (ref 4.22–5.81)
RDW: 14.1 % (ref 11.5–15.5)
WBC Count: 4.6 10*3/uL (ref 4.0–10.5)
nRBC: 0 % (ref 0.0–0.2)

## 2022-10-02 NOTE — Progress Notes (Signed)
Hematology and Oncology Follow Up Visit  Joseph Lindsey [id number] 30-Nov-1971 51 y.o. 10/02/2022 10:18 AM  CC: Carolann Littler, M.D.    Principle Diagnosis: 51 year old man with thrombocytopenia related to hereditary reason versus chronic ITP diagnosed in 2011.   Current therapy: Active surveillance.  Interim History:   Joseph Lindsey presents today for a follow-up visit.  Since last visit, he reports no major changes in his health.  He denies any recent hospitalizations or illnesses.  He denies any nausea, vomiting or abdominal pain.  He denies any hematochezia or melena.  He denies any hemoptysis or hematemesis.     Medications:  reviewed without changes. Current Outpatient Medications  Medication Sig Dispense Refill   fexofenadine (ALLEGRA) 180 MG tablet Take 180 mg by mouth daily.     levothyroxine (SYNTHROID) 100 MCG tablet TAKE 1 TABLET DAILY 90 tablet 3   predniSONE (DELTASONE) 10 MG tablet Taper as follows over 10 days :   6-6-4-4-3-3-2-2-1-1 32 tablet 0   triamcinolone cream (KENALOG) 0.1 % APPLY TOPICALLY TWICE A DAY AS NEEDED 30 g 0   No current facility-administered medications for this visit.    Allergies: No Known Allergies     Physical Exam:        Blood pressure 127/81, pulse (!) 50, temperature 99 F (37.2 C), weight 168 lb 4.8 oz (76.3 kg), SpO2 100 %.      ECOG: 0   General appearance: Alert, awake without any distress. Head: Atraumatic without abnormalities Oropharynx: Without any thrush or ulcers. Eyes: No scleral icterus. Lymph nodes: No lymphadenopathy noted in the cervical, supraclavicular, or axillary nodes Heart:regular rate and rhythm, without any murmurs or gallops.   Lung: Clear to auscultation without any rhonchi, wheezes or dullness to percussion. Abdomin: Soft, nontender without any shifting dullness or ascites. Musculoskeletal: No clubbing or cyanosis. Neurological: No motor or sensory deficits. Skin: No rashes or  lesions.         Lab Results: Lab Results  Component Value Date   WBC 5.0 04/03/2022   HGB 14.7 04/03/2022   HCT 44.9 04/03/2022   MCV 84.6 04/03/2022   PLT 64 (L) 04/03/2022     Chemistry      Component Value Date/Time   NA 137 12/05/2021 0839   NA 140 08/17/2015 1114   K 4.3 12/05/2021 0839   K 4.1 08/17/2015 1114   CL 101 12/05/2021 0839   CL 106 12/15/2012 0948   CO2 29 12/05/2021 0839   CO2 26 08/17/2015 1114   BUN 11 12/05/2021 0839   BUN 10.3 08/17/2015 1114   CREATININE 1.09 12/05/2021 0839   CREATININE 1.2 08/17/2015 1114      Component Value Date/Time   CALCIUM 9.3 12/05/2021 0839   CALCIUM 9.4 08/17/2015 1114   ALKPHOS 69 12/05/2021 0839   ALKPHOS 67 08/17/2015 1114   AST 39 (H) 12/05/2021 0839   AST 24 08/17/2015 1114   ALT 37 12/05/2021 0839   ALT 30 08/17/2015 1114   BILITOT 0.5 12/05/2021 0839   BILITOT 0.60 08/17/2015 1114        Impression and plan:  51 year old man with:  1.  Thrombocytopenia diagnosed in 2011.  The differential diagnosis including chronic ITP versus hereditary syndrome.     He continues to be on active surveillance without any indication for treatment with platelet count continues to be over or close to 50.  A trial of steroids, IVIG or rituximab could be considered if he has active bleeding or worsening thrombocytopenia.  Bone marrow biopsy could also be considered if other cytopenias are noted.    2.  Age-appropriate cancer screening: He is up-to-date with colonoscopy and endoscopy and continues to follow with GI.  3.  Follow-up: He will return in 6 months for repeat follow-up.   30  minutes were dedicated to this visit.  The time was spent on reviewing laboratory data, disease status update and outlining future plan of care review.    Zola Button, MD 10/25/202310:18 AM

## 2022-10-29 ENCOUNTER — Ambulatory Visit (INDEPENDENT_AMBULATORY_CARE_PROVIDER_SITE_OTHER): Payer: BC Managed Care – PPO | Admitting: Family Medicine

## 2022-10-29 ENCOUNTER — Telehealth: Payer: Self-pay

## 2022-10-29 ENCOUNTER — Encounter: Payer: Self-pay | Admitting: Family Medicine

## 2022-10-29 VITALS — BP 114/78 | HR 45 | Temp 97.5°F | Ht 69.69 in | Wt 172.2 lb

## 2022-10-29 DIAGNOSIS — Z0000 Encounter for general adult medical examination without abnormal findings: Secondary | ICD-10-CM

## 2022-10-29 LAB — HEPATIC FUNCTION PANEL
ALT: 22 U/L (ref 0–53)
AST: 29 U/L (ref 0–37)
Albumin: 4.8 g/dL (ref 3.5–5.2)
Alkaline Phosphatase: 62 U/L (ref 39–117)
Bilirubin, Direct: 0.1 mg/dL (ref 0.0–0.3)
Total Bilirubin: 0.5 mg/dL (ref 0.2–1.2)
Total Protein: 7.7 g/dL (ref 6.0–8.3)

## 2022-10-29 LAB — HEMOGLOBIN A1C: Hgb A1c MFr Bld: 5.7 % (ref 4.6–6.5)

## 2022-10-29 LAB — CBC WITH DIFFERENTIAL/PLATELET
Basophils Absolute: 0 10*3/uL (ref 0.0–0.1)
Basophils Relative: 0.8 % (ref 0.0–3.0)
Eosinophils Absolute: 0.1 10*3/uL (ref 0.0–0.7)
Eosinophils Relative: 1.6 % (ref 0.0–5.0)
HCT: 48.5 % (ref 39.0–52.0)
Hemoglobin: 16 g/dL (ref 13.0–17.0)
Lymphocytes Relative: 40.6 % (ref 12.0–46.0)
Lymphs Abs: 2 10*3/uL (ref 0.7–4.0)
MCHC: 33 g/dL (ref 30.0–36.0)
MCV: 84.6 fl (ref 78.0–100.0)
Monocytes Absolute: 0.5 10*3/uL (ref 0.1–1.0)
Monocytes Relative: 10.4 % (ref 3.0–12.0)
Neutro Abs: 2.3 10*3/uL (ref 1.4–7.7)
Neutrophils Relative %: 46.6 % (ref 43.0–77.0)
Platelets: 45 10*3/uL — CL (ref 150.0–400.0)
RBC: 5.74 Mil/uL (ref 4.22–5.81)
RDW: 15 % (ref 11.5–15.5)
WBC: 4.9 10*3/uL (ref 4.0–10.5)

## 2022-10-29 LAB — BASIC METABOLIC PANEL
BUN: 11 mg/dL (ref 6–23)
CO2: 29 mEq/L (ref 19–32)
Calcium: 9.9 mg/dL (ref 8.4–10.5)
Chloride: 102 mEq/L (ref 96–112)
Creatinine, Ser: 1.06 mg/dL (ref 0.40–1.50)
GFR: 81.8 mL/min (ref >60.00)
Glucose, Bld: 67 mg/dL — ABNORMAL LOW (ref 70–99)
Potassium: 3.9 mEq/L (ref 3.5–5.1)
Sodium: 138 mEq/L (ref 135–145)

## 2022-10-29 LAB — LIPID PANEL
Cholesterol: 232 mg/dL — ABNORMAL HIGH (ref 0–200)
HDL: 57.2 mg/dL (ref >39.00)
LDL Cholesterol: 165 mg/dL — ABNORMAL HIGH (ref 0–99)
NonHDL: 174.57
Total CHOL/HDL Ratio: 4
Triglycerides: 49 mg/dL (ref 0.0–149.0)
VLDL: 9.8 mg/dL (ref 0.0–40.0)

## 2022-10-29 LAB — TSH: TSH: 2.22 u[IU]/mL (ref 0.35–5.50)

## 2022-10-29 LAB — PSA: PSA: 0.38 ng/mL (ref 0.10–4.00)

## 2022-10-29 MED ORDER — LEVOTHYROXINE SODIUM 100 MCG PO TABS: 100.0000 ug | ORAL_TABLET | Freq: Every day | ORAL | 3 refills | Status: DC

## 2022-10-29 NOTE — Progress Notes (Signed)
Established Patient Office Visit  Subjective   Patient ID: Joseph Lindsey, male    DOB: 04-28-1971  Age: 51 y.o. MRN: [id number]  Chief Complaint  Patient presents with   Annual Exam    HPI   Joseph Lindsey is seen for annual exam.  He has history of chronic thrombocytopenia, hypothyroidism, chronic neck pain with degenerative arthritis.  Generally doing well.  He remains on levothyroxine 100 mcg daily.  Main issue is ongoing neck difficulties.  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   Family history significant for father with hypertension diabetes.  Sister with diabetes.  Brother had gastric cancer age 64   Social history-he is married.  Non-smoker.  No alcohol use. Has 2 children.  Oldest is 16.  Works for [employer] Research officer, trade union.  Past Medical History:  Diagnosis Date   Fissure, anal    2003   Migraines    Primary thrombocytopenia, unspecified    Thyroid disease    hypothyroid   Past Surgical History:  Procedure Laterality Date   BREATH TEK H PYLORI N/A 11/11/2013   Procedure: BREATH TEK [name];  Surgeon: Jerene Bears, MD;  Location: [facility];  Service: Gastroenterology;  Laterality: N/A;    reports that he has never smoked. He has never used smokeless tobacco. He reports current alcohol use. He reports that he does not use drugs. family history includes Asthma in his brother, father, and sister; Cancer (age of onset: 77) in his brother; Diabetes in his father and sister; Hypertension in his father; Sickle cell trait in an other family member; Stomach cancer in his brother. No Known Allergies  Review of Systems  Constitutional:  Negative for chills, fever, malaise/fatigue and weight loss.  HENT:  Negative for hearing loss.   Eyes:  Negative for blurred vision and double vision.  Respiratory:  Negative  for cough and shortness of breath.   Cardiovascular:  Negative for chest pain, palpitations and leg swelling.  Gastrointestinal:  Negative for abdominal pain, blood in stool, constipation and diarrhea.  Genitourinary:  Negative for dysuria.  Skin:  Negative for rash.  Neurological:  Negative for dizziness, speech change, seizures, loss of consciousness and headaches.  Psychiatric/Behavioral:  Negative for depression.       Objective:     BP 114/78 (BP Location: Left Arm, Patient Position: Sitting, Cuff Size: Normal)   Pulse (!) 45   Temp (!) 97.5 F (36.4 C) (Oral)   Ht 5' 9.69" (1.77 m)   Wt 172 lb 3.2 oz (78.1 kg)   SpO2 99%   BMI 24.93 kg/m    Physical Exam Vitals reviewed.  Constitutional:      General: He is not in acute distress.    Appearance: He is well-developed.  HENT:     Head: Normocephalic and atraumatic.     Right Ear: External ear normal.     Left Ear: External ear normal.  Eyes:     Conjunctiva/sclera: Conjunctivae normal.     Pupils: Pupils are equal, round, and reactive to light.  Neck:     Thyroid: No thyromegaly.  Cardiovascular:     Rate and Rhythm: Regular rhythm.     Heart sounds: Normal heart sounds. No murmur heard.    Comments: Mild bradycardia with palpated rate 48 Pulmonary:     Effort: No respiratory distress.     Breath  sounds: No wheezing or rales.  Abdominal:     General: Bowel sounds are normal. There is no distension.     Palpations: Abdomen is soft. There is no mass.     Tenderness: There is no abdominal tenderness. There is no guarding or rebound.  Musculoskeletal:     Cervical back: Normal range of motion and neck supple.     Right lower leg: No edema.     Left lower leg: No edema.  Lymphadenopathy:     Cervical: No cervical adenopathy.  Skin:    Findings: No rash.  Neurological:     Mental Status: He is alert and oriented to person, place, and time.     Cranial Nerves: No cranial nerve deficit.     Deep Tendon Reflexes:  Reflexes normal.      No results found for any visits on 10/29/22.    The 10-year ASCVD risk score (Arnett DK, et al., 2019) is: 4.3%    Assessment & Plan:   Problem List Items Addressed This Visit   None Visit Diagnoses     Physical exam    -  Primary   Relevant Orders   Basic metabolic panel   Lipid panel   CBC with Differential/Platelet   TSH   Hepatic function panel   Hemoglobin A1c   PSA     Generally healthy 51 year old male.  History of mild chronic thrombocytopenia.  He has hypothyroidism and has been on replacement for several years.  Does have family history of type 2 diabetes.  -Discussed importance of low glycemic diet and regular exercise -Offered flu vaccine but he declines -Check labs as above.  Include A1c with family history of type 2 diabetes -Consider Shingrix vaccine at some point this year -Needs repeat colonoscopy next year  No follow-ups on file.    [name], MD

## 2022-10-29 NOTE — Patient Instructions (Signed)
Consider Shingrix vaccine at some point this year.  

## 2022-10-29 NOTE — Telephone Encounter (Signed)
CRITICAL VALUE STICKER  CRITICAL VALUE: Platelet count- 45  RECEIVER (on-site recipient of call): Joseph Lindsey, Joseph Lindsey  DATE & TIME NOTIFIED:  11:24 am   MESSENGER (representative from lab): Holly  MD NOTIFIED: Via epic and verbal   TIME OF NOTIFICATION: 11:24 am   RESPONSE:  N/A

## 2022-10-30 NOTE — Telephone Encounter (Signed)
Results forwarded to Dr. Clelia Croft

## 2022-11-11 ENCOUNTER — Telehealth: Payer: Self-pay | Admitting: Family Medicine

## 2022-11-11 NOTE — Telephone Encounter (Signed)
Melissa pharm tech with [facility] is calling and they switch manufacture of levothyroxine (SYNTHROID) 100 MCG tablet  from lanett to accord and would lke to know if that ok with md

## 2022-11-12 NOTE — Telephone Encounter (Signed)
Lvm on [facility] pharmacy line that Dr. Almyra Free the manufacturer.

## 2022-12-11 ENCOUNTER — Other Ambulatory Visit: Payer: Self-pay | Admitting: Family Medicine

## 2022-12-23 ENCOUNTER — Other Ambulatory Visit: Payer: Self-pay | Admitting: Family Medicine

## 2022-12-27 ENCOUNTER — Telehealth: Payer: Self-pay | Admitting: Oncology

## 2022-12-27 NOTE — Telephone Encounter (Signed)
Called patient regarding upcoming April appointments, left a voicemail.  

## 2023-03-04 ENCOUNTER — Other Ambulatory Visit: Payer: Self-pay | Admitting: Family Medicine

## 2023-04-03 ENCOUNTER — Other Ambulatory Visit: Payer: Self-pay | Admitting: Physician Assistant

## 2023-04-03 ENCOUNTER — Ambulatory Visit: Payer: BC Managed Care – PPO | Admitting: Oncology

## 2023-04-03 ENCOUNTER — Other Ambulatory Visit: Payer: BC Managed Care – PPO

## 2023-04-03 DIAGNOSIS — D696 Thrombocytopenia, unspecified: Secondary | ICD-10-CM

## 2023-04-04 ENCOUNTER — Inpatient Hospital Stay: Payer: BC Managed Care – PPO

## 2023-04-04 ENCOUNTER — Inpatient Hospital Stay: Payer: BC Managed Care – PPO | Attending: Physician Assistant | Admitting: Physician Assistant

## 2023-04-09 ENCOUNTER — Telehealth: Payer: Self-pay | Admitting: Physician Assistant

## 2023-04-10 ENCOUNTER — Telehealth: Payer: Self-pay | Admitting: Physician Assistant

## 2023-05-02 ENCOUNTER — Other Ambulatory Visit: Payer: Self-pay

## 2023-05-02 ENCOUNTER — Inpatient Hospital Stay (HOSPITAL_BASED_OUTPATIENT_CLINIC_OR_DEPARTMENT_OTHER): Payer: BC Managed Care – PPO | Admitting: Physician Assistant

## 2023-05-02 ENCOUNTER — Inpatient Hospital Stay: Payer: BC Managed Care – PPO | Attending: Physician Assistant

## 2023-05-02 VITALS — BP 124/65 | HR 53 | Temp 97.7°F | Resp 19 | Wt 170.3 lb

## 2023-05-02 DIAGNOSIS — Z862 Personal history of diseases of the blood and blood-forming organs and certain disorders involving the immune mechanism: Secondary | ICD-10-CM | POA: Diagnosis not present

## 2023-05-02 DIAGNOSIS — Z800 Family history of malignant neoplasm of digestive organs: Secondary | ICD-10-CM | POA: Diagnosis not present

## 2023-05-02 DIAGNOSIS — D693 Immune thrombocytopenic purpura: Secondary | ICD-10-CM | POA: Diagnosis not present

## 2023-05-02 DIAGNOSIS — D696 Thrombocytopenia, unspecified: Secondary | ICD-10-CM

## 2023-05-02 LAB — CBC WITH DIFFERENTIAL (CANCER CENTER ONLY)
Abs Immature Granulocytes: 0.01 10*3/uL (ref 0.00–0.07)
Basophils Absolute: 0 10*3/uL (ref 0.0–0.1)
Basophils Relative: 0 %
Eosinophils Absolute: 0.1 10*3/uL (ref 0.0–0.5)
Eosinophils Relative: 1 %
HCT: 46.1 % (ref 39.0–52.0)
Hemoglobin: 15.7 g/dL (ref 13.0–17.0)
Immature Granulocytes: 0 %
Lymphocytes Relative: 39 %
Lymphs Abs: 1.9 10*3/uL (ref 0.7–4.0)
MCH: 28.6 pg (ref 26.0–34.0)
MCHC: 34.1 g/dL (ref 30.0–36.0)
MCV: 84.1 fL (ref 80.0–100.0)
Monocytes Absolute: 0.6 10*3/uL (ref 0.1–1.0)
Monocytes Relative: 12 %
Neutro Abs: 2.3 10*3/uL (ref 1.7–7.7)
Neutrophils Relative %: 48 %
Platelet Count: 55 10*3/uL — ABNORMAL LOW (ref 150–400)
RBC: 5.48 MIL/uL (ref 4.22–5.81)
RDW: 14.1 % (ref 11.5–15.5)
WBC Count: 4.8 10*3/uL (ref 4.0–10.5)
nRBC: 0 % (ref 0.0–0.2)

## 2023-05-02 LAB — FOLATE: Folate: 8.6 ng/mL (ref >5.9)

## 2023-05-02 LAB — VITAMIN B12: Vitamin B-12: 735 pg/mL (ref 180–914)

## 2023-05-02 LAB — IMMATURE PLATELET FRACTION: Immature Platelet Fraction: 45 % — ABNORMAL HIGH (ref 1.2–8.6)

## 2023-05-02 NOTE — Progress Notes (Signed)
[facility] [phone]   [phone]  INITIAL CONSULT NOTE  Patient Care Team: Kristian Covey, MD as PCP - General   CHIEF COMPLAINTS/PURPOSE OF CONSULTATION:  Immune Thrombocytopenic Purpura (ITP) diagnosed in 2011.   CURRENT THERAPY: Active surveillance  HISTORY OF PRESENTING ILLNESS:  Joseph Lindsey 52 y.o. male returns for a follow up for continued surveillance for ITP. He was last seen by Dr. Clelia Croft on 10/02/2022. He presents today unaccompanied to transfer care to Dr. Leonides Schanz.   On exam today, Joseph Lindsey reports that he is doing well without any changes to his health. His energy and appetite are stable. He does report occasional episode of hematochezia with constipation and straining. He has no other signs of bleeding. He denies any nausea, vomiting or abdominal pain. He denies fevers, chills, sweats, shortness of breath, chest pain or cough. He has no other complaints. Rest of the ROS is below.   MEDICAL HISTORY:  Past Medical History:  Diagnosis Date   Fissure, anal    2003   Migraines    Primary thrombocytopenia, unspecified    Thyroid disease    hypothyroid    SURGICAL HISTORY: Past Surgical History:  Procedure Laterality Date   BREATH TEK H PYLORI N/A 11/11/2013   Procedure: BREATH TEK Richardo Priest;  Surgeon: Beverley Fiedler, MD;  Location: [facility];  Service: Gastroenterology;  Laterality: N/A;    SOCIAL HISTORY: Social History   Socioeconomic History   Marital status: Married    Spouse name: Not on file   Number of children: 1   Years of education: Not on file   Highest education level: Not on file  Occupational History    Employer: [employer]  Tobacco Use   Smoking status: Never   Smokeless tobacco: Never  Vaping Use   Vaping Use: Never used  Substance and Sexual Activity   Alcohol use: Yes    Comment: once a week   Drug use: No   Sexual activity: Not on file  Other Topics Concern   Not on file   Social History Narrative   Not on file   Social Determinants of Health   Financial Resource Strain: Not on file  Food Insecurity: Not on file  Transportation Needs: Not on file  Physical Activity: Not on file  Stress: Not on file  Social Connections: Not on file  Intimate Partner Violence: Not on file    FAMILY HISTORY: Family History  Problem Relation Age of Onset   Hypertension Father    Diabetes Father    Asthma Father    Diabetes Sister    Asthma Sister    Asthma Brother    Cancer Brother 78       gastric cancer   Stomach cancer Brother    Sickle cell trait Other        mother   Colon cancer Neg Hx    Esophageal cancer Neg Hx    Rectal cancer Neg Hx     ALLERGIES:  has No Known Allergies.  MEDICATIONS:  Current Outpatient Medications  Medication Sig Dispense Refill   fexofenadine (ALLEGRA) 180 MG tablet Take 180 mg by mouth daily.     levothyroxine (SYNTHROID) 100 MCG tablet TAKE 1 TABLET DAILY 90 tablet 1   triamcinolone cream (KENALOG) 0.1 % APPLY TOPICALLY TWICE A DAY AS NEEDED 30 g 2   No current facility-administered medications for this visit.    REVIEW OF SYSTEMS:   Constitutional: ( - )  fevers, ( - )  chills , ( - ) night sweats Eyes: ( - ) blurriness of vision, ( - ) double vision, ( - ) watery eyes Ears, nose, mouth, throat, and face: ( - ) mucositis, ( - ) sore throat Respiratory: ( - ) cough, ( - ) dyspnea, ( - ) wheezes Cardiovascular: ( - ) palpitation, ( - ) chest discomfort, ( - ) lower extremity swelling Gastrointestinal:  ( - ) nausea, ( - ) heartburn, ( - ) change in bowel habits Skin: ( - ) abnormal skin rashes Lymphatics: ( - ) new lymphadenopathy, ( - ) easy bruising Neurological: ( - ) numbness, ( - ) tingling, ( - ) new weaknesses Behavioral/Psych: ( - ) mood change, ( - ) new changes  All other systems were reviewed with the patient and are negative.  PHYSICAL EXAMINATION: ECOG PERFORMANCE STATUS: 0 - Asymptomatic  Vitals:    05/02/23 1126  BP: 124/65  Pulse: (!) 53  Resp: 19  Temp: 97.7 F (36.5 C)  SpO2: 100%   Filed Weights   05/02/23 1126  Weight: 170 lb 5 oz (77.3 kg)    GENERAL: well appearing male in NAD  SKIN: skin color, texture, turgor are normal, no rashes or significant lesions EYES: conjunctiva are pink and non-injected, sclera clear LUNGS: clear to auscultation and percussion with normal breathing effort HEART: regular rate & rhythm and no murmurs and no lower extremity edema ABDOMEN: soft, non-tender, non-distended, normal bowel sounds Musculoskeletal: no cyanosis of digits and no clubbing  PSYCH: alert & oriented x 3, fluent speech NEURO: no focal motor/sensory deficits  LABORATORY DATA:  I have reviewed the data as listed    Latest Ref Rng & Units 10/29/2022    9:22 AM 10/02/2022   10:20 AM 04/03/2022    3:11 PM  CBC  WBC 4.0 - 10.5 K/uL 4.9  4.6  5.0   Hemoglobin 13.0 - 17.0 g/dL 16.1  09.6  04.5   Hematocrit 39.0 - 52.0 % 48.5  47.3  44.9   Platelets 150.0 - 400.0 K/uL 45.0 Repeated and verified X2.  58  64        Latest Ref Rng & Units 10/29/2022    9:22 AM 12/05/2021    8:39 AM 03/02/2021    3:08 AM  CMP  Glucose 70 - 99 mg/dL 67  87  409   BUN 6 - 23 mg/dL 11  11  14    Creatinine 0.40 - 1.50 mg/dL 8.11  9.14  7.82   Sodium 135 - 145 mEq/L 138  137  137   Potassium 3.5 - 5.1 mEq/L 3.9  4.3  4.1   Chloride 96 - 112 mEq/L 102  101  104   CO2 19 - 32 mEq/L 29  29  28    Calcium 8.4 - 10.5 mg/dL 9.9  9.3  9.2   Total Protein 6.0 - 8.3 g/dL 7.7  7.0    Total Bilirubin 0.2 - 1.2 mg/dL 0.5  0.5    Alkaline Phos 39 - 117 U/L 62  69    AST 0 - 37 U/L 29  39    ALT 0 - 53 U/L 22  37      ASSESSMENT & PLAN Joseph Lindsey is a 52 y.o. male who presents to the clinic for continued management of ITP.   #ITP: --Diagnosed in July 2011 and managed up to date by Dr. Clelia Croft. He will transfer care to Dr. Leonides Schanz starting  today.  --Baseline platelet counts are between 50-60K  and has not required any intervention.  --Prior workup includes CT abdomen from 06/22/2013 showed no acute abnormality including remarkable liver and spleen.  --Lab today show stable platelet count of 55K. No other cytopenias. Immature platelet fraction was elevated at 45%. No evidence of vitamin B12 or folate deficiency. MMA levels are pending --Continue on observation.  --RTC in 6 months with repeat labs.   No orders of the defined types were placed in this encounter.   All questions were answered. The patient knows to call the clinic with any problems, questions or concerns.  I have spent a total of 30 minutes minutes of face-to-face and non-face-to-face time, preparing to see the patient, o performing a medically appropriate examination, counseling and educating the patient, documenting clinical information in the electronic health record, and care coordination.   Georga Kaufmann, PA-C Department of Hematology/Oncology [facility] at [facility] Phone: [phone]  Patient was seen with Dr. Leonides Schanz.   I have read the above note and personally examined the patient. I agree with the assessment and plan as noted above.  Briefly Joseph Lindsey is a 52 year old male with medical history significant for ITP.  He has followed previously with Dr. Clelia Croft and is now transferring care to our service.  He has a baseline platelet count typically above 30 but less than 100.  He has been stable in this range.  He notes he is not having any trouble with bleeding, bruising, or dark stools.  At this time I would recommend routine follow-up in our clinic every 6 months with labs.  In the event the patient were develop a platelet count less than 30 would plan to treat with dexamethasone 40 mg every 4 days.  The patient voiced understanding of our findings and the plan moving forward.   Ulysees Barns, MD Department of Hematology/Oncology [facility] at [facility] Phone: [phone] Pager: [phone] Email: [email or web address]

## 2023-05-06 ENCOUNTER — Telehealth: Payer: Self-pay

## 2023-05-06 LAB — METHYLMALONIC ACID, SERUM: Methylmalonic Acid, Quantitative: 119 nmol/L (ref 0–378)

## 2023-05-06 NOTE — Telephone Encounter (Signed)
Pt advised and voiced understanding.   

## 2023-05-06 NOTE — Telephone Encounter (Signed)
-----   Message from Briant Cedar, PA-C sent at 05/06/2023 11:44 AM EDT ----- Please notify patient that he does not have vitamin B12 or folate deficiency. Findings are most consistent with ITP with stable platelet counts. We will continue to monitor and see him back in 6 months.   ----- Message ----- From: Interface, Lab In [place] Sent: 05/02/2023  11:57 AM EDT To: Briant Cedar, PA-C

## 2023-06-09 ENCOUNTER — Other Ambulatory Visit: Payer: Self-pay | Admitting: Family Medicine

## 2023-08-20 IMAGING — DX DG CERVICAL SPINE COMPLETE 4+V
5 series · 6 of 6 positions shown · non-contrast
Comparison: None.

CLINICAL DATA: Left neck pain

EXAM:
CERVICAL SPINE - COMPLETE 4+ VIEW

[c-spine lat]
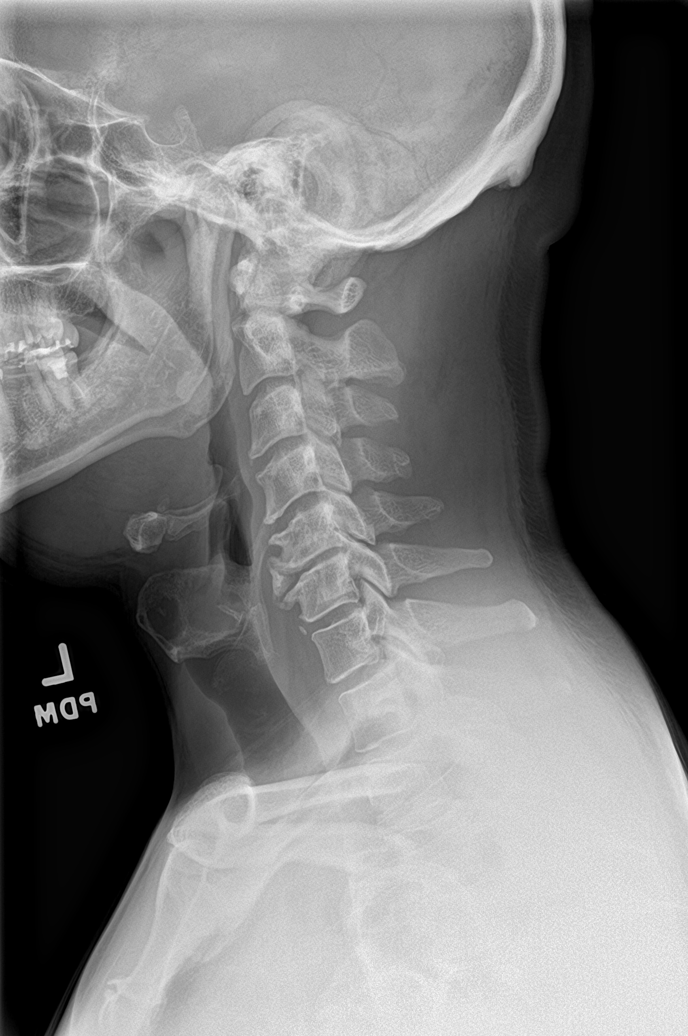

[c-spine obl (1 of 2)]
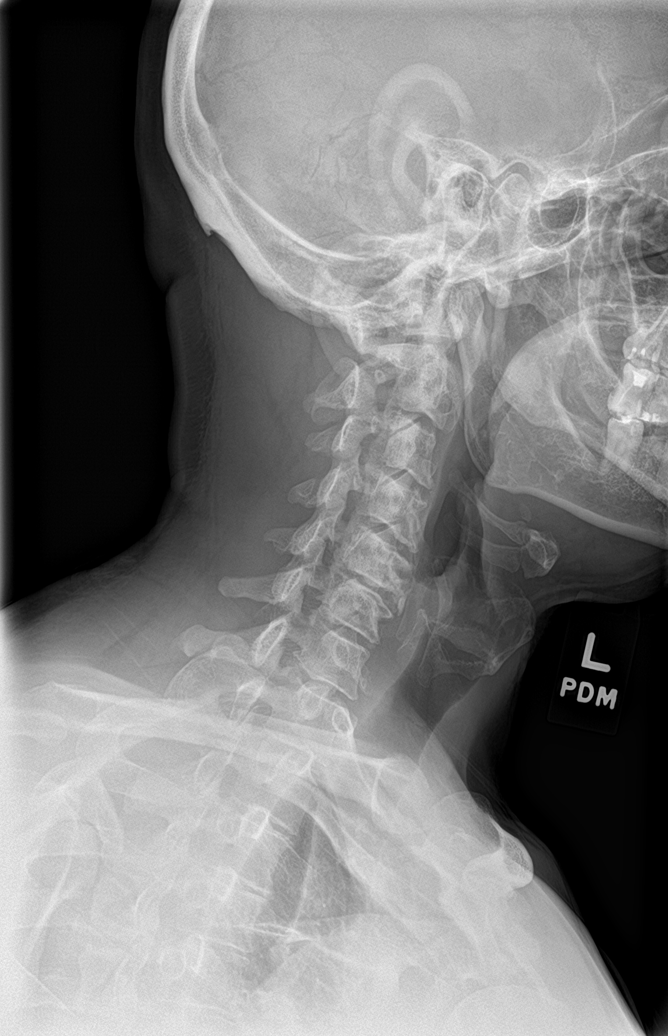

[c-spine obl (2 of 2)]
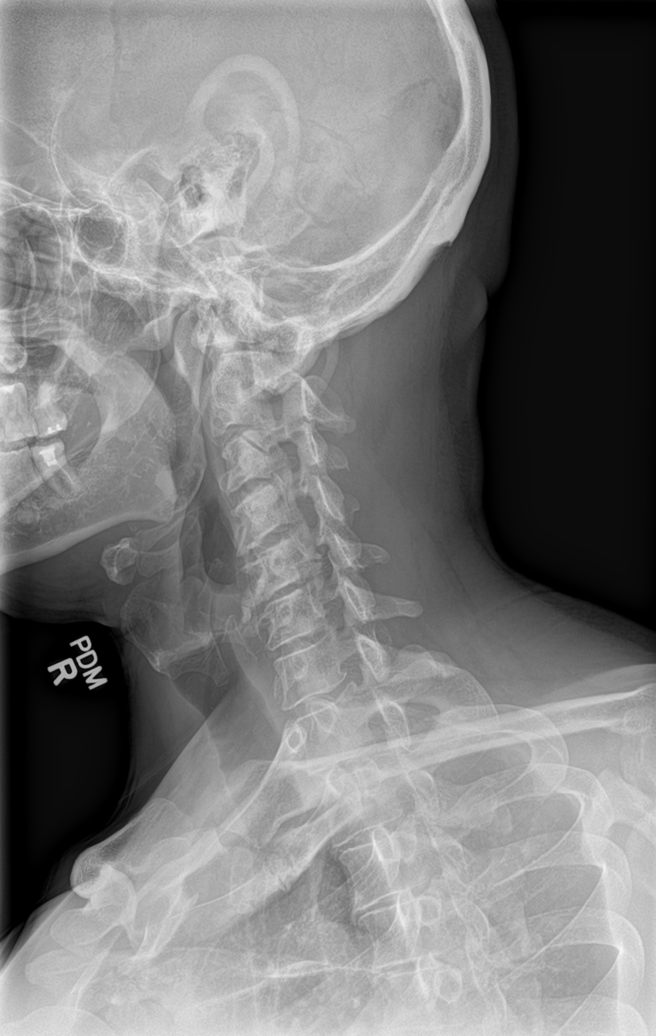

[c-spine ap]
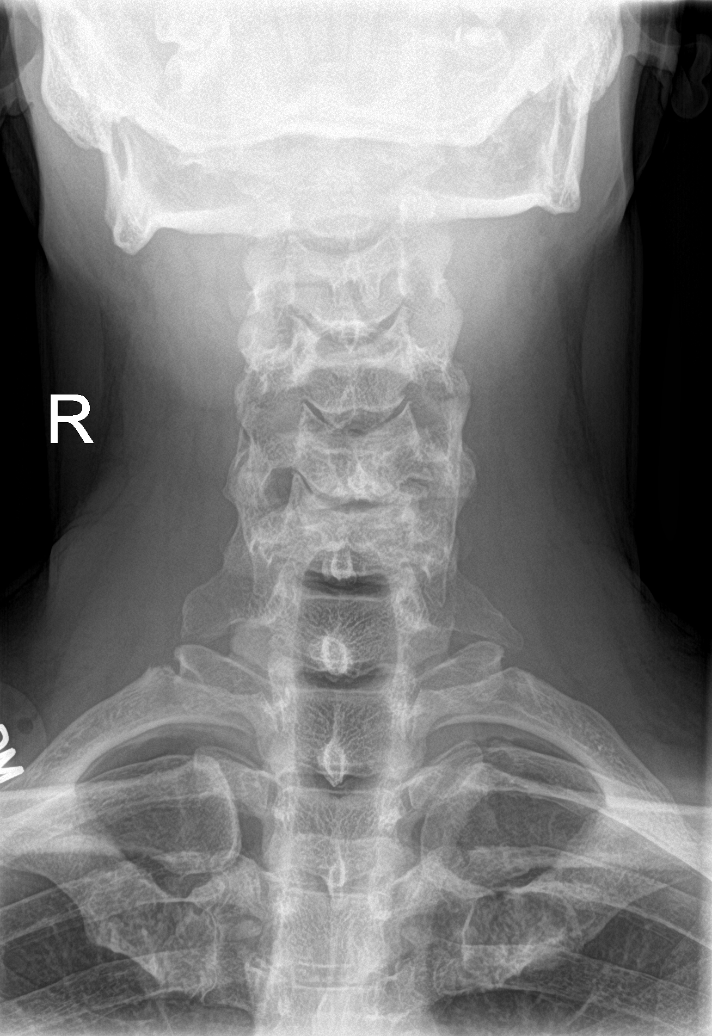

[Series 5: c-spine open mouth · 0.14mm/px · 2 of 2 slices shown]
[im 1/2]
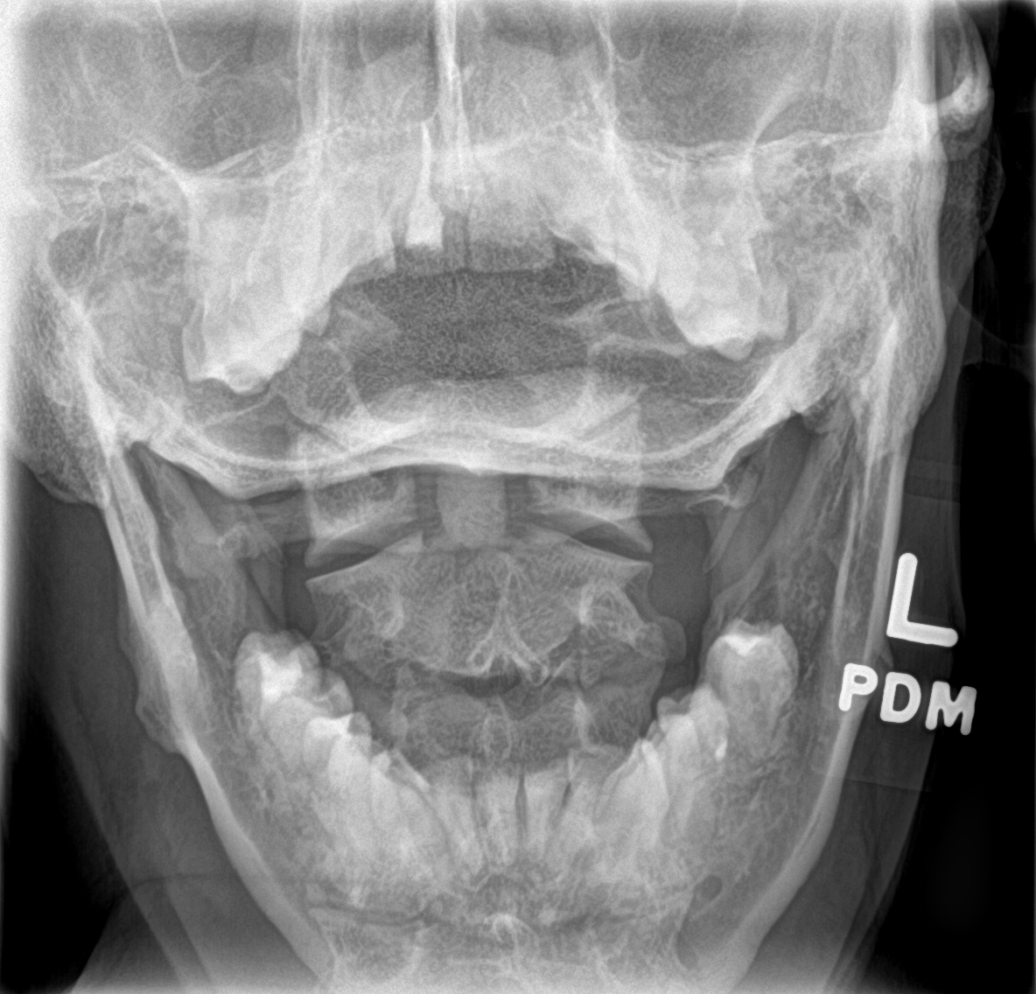
[im 2/2]
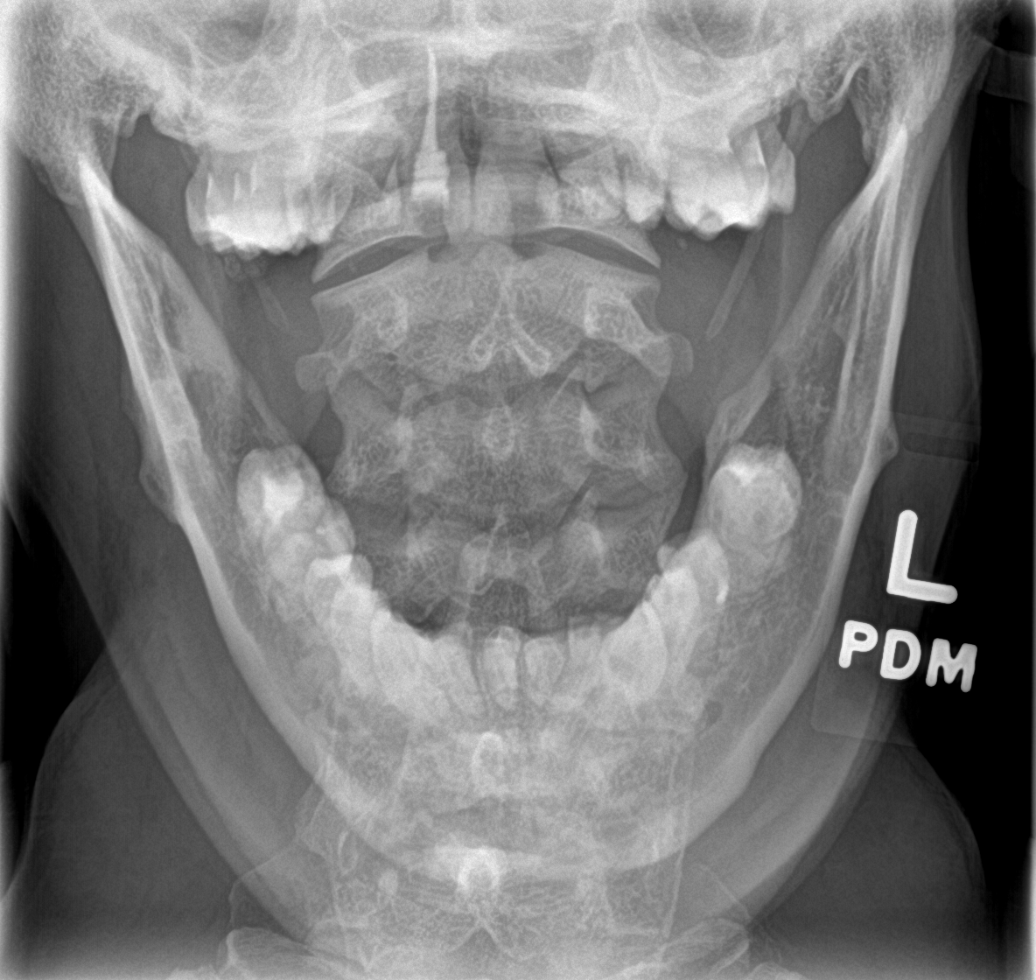

[6 of 6 positions shown; findings below may reference images not displayed]

FINDINGS: Normal alignment. No acute fracture or listhesis. Vertebral body
height is preserved. There is intervertebral disc space narrowing
and endplate remodeling of C3-C6, most severe at C5-6, in keeping
with changes of mild to moderate degenerative disc disease.
Prevertebral soft tissues are not thickened. Spinal canal is widely
patent. Oblique views do not optimally profiled the neural foramina,
however, at least mild bilateral neuroforaminal narrowing is
suspected at C5-6 secondary to uncovertebral arthrosis.
IMPRESSION: No acute fracture or listhesis.

Mild to moderate degenerative disc disease, most severe at C5-6 with
suspected mild bilateral neuroforaminal narrowing noted at this
level. Suspected neural impingement would be better evaluated with
MRI examination.

## 2023-10-23 ENCOUNTER — Encounter: Payer: Self-pay | Admitting: Internal Medicine

## 2023-10-28 NOTE — Progress Notes (Signed)
[facility] OFFICE PROGRESS NOTE  Joseph Never Elberta Fortis, MD [address] [place] [address]  DIAGNOSIS: Immune Thrombocytopenic Purpura (ITP) diagnosed in 2011.   CURRENT THERAPY: Active surveillance   INTERVAL HISTORY: Joseph Lindsey 52 y.o. male returns to the clinic today for a 6 month follow up visit. He is being followed due to history of ITP. He denies any major changes in his health since he was last seen except the patient does like eating spicy food.  He typically would eat spicy food without any concerns.  However, for the last 3 months or so he states that he has some dysuria when he eats spicy food.  He denies any malodorous urine, hematuria, or cloudy urine.  He has not been evaluated.   Denies any fever, chills, night sweats or weight loss. Denies abnormal bleeding or bruising. Denies changes in his appetite or energy. Denies nausea, vomiting, diarrhea, or constipation. Denies chest pain, shortness of breath, or cough. He is here for evaluation and repeat blood work.    MEDICAL HISTORY: Past Medical History:  Diagnosis Date   Fissure, anal    2003   Migraines    Primary thrombocytopenia, unspecified    Thyroid disease    hypothyroid    ALLERGIES:  has No Known Allergies.  MEDICATIONS:  Current Outpatient Medications  Medication Sig Dispense Refill   fexofenadine (ALLEGRA) 180 MG tablet Take 180 mg by mouth daily.     levothyroxine (SYNTHROID) 100 MCG tablet TAKE 1 TABLET DAILY 90 tablet 1   triamcinolone cream (KENALOG) 0.1 % APPLY TOPICALLY TWICE A DAY AS NEEDED 30 g 2   No current facility-administered medications for this visit.    SURGICAL HISTORY:  Past Surgical History:  Procedure Laterality Date   BREATH TEK H PYLORI N/A 11/11/2013   Procedure: BREATH TEK Richardo Priest;  Surgeon: Beverley Fiedler, MD;  Location: [facility];  Service: Gastroenterology;  Laterality: N/A;    REVIEW OF SYSTEMS:   Review of Systems  Constitutional:  Negative for appetite change, chills, fatigue, fever and unexpected weight change.  HENT:   Negative for mouth sores, nosebleeds, sore throat and trouble swallowing.   Eyes: Negative for eye problems and icterus.  Respiratory: Negative for cough, hemoptysis, shortness of breath and wheezing.   Cardiovascular: Negative for chest pain and leg swelling.  Gastrointestinal: Negative for abdominal pain, constipation, diarrhea, nausea and vomiting.  Genitourinary: Positive for dysuria.  Negative for bladder incontinence, difficulty urinating, frequency and hematuria.   Musculoskeletal: Negative for back pain, gait problem, neck pain and neck stiffness.  Skin: Negative for itching and rash.  Neurological: Negative for dizziness, extremity weakness, gait problem, headaches, light-headedness and seizures.  Hematological: Negative for adenopathy. Does not bruise/bleed easily.  Psychiatric/Behavioral: Negative for confusion, depression and sleep disturbance. The patient is not nervous/anxious.     PHYSICAL EXAMINATION:  There were no vitals taken for this visit.  ECOG PERFORMANCE STATUS: 0  Physical Exam  Constitutional: Oriented to person, place, and time and well-developed, well-nourished, and in no distress.  HENT:  Head: Normocephalic and atraumatic.  Mouth/Throat: Oropharynx is clear and moist. No oropharyngeal exudate.  Eyes: Conjunctivae are normal. Right eye exhibits no discharge. Left eye exhibits no discharge. No scleral icterus.  Neck: Normal range of motion. Neck supple.  Cardiovascular: Normal rate, regular rhythm, normal heart sounds and intact distal pulses.   Pulmonary/Chest: Effort normal and breath sounds normal. No respiratory distress. No wheezes. No rales.  Abdominal: Soft.  Bowel sounds are normal. Exhibits no distension and no mass. There is no tenderness.  Musculoskeletal: Normal range of motion. Exhibits no edema.  Lymphadenopathy:    No cervical adenopathy.  Neurological:  Alert and oriented to person, place, and time. Exhibits normal muscle tone. Gait normal. Coordination normal.  Skin: Skin is warm and dry. No rash noted. Not diaphoretic. No erythema. No pallor.  Psychiatric: Mood, memory and judgment normal.  Vitals reviewed.  LABORATORY DATA: Lab Results  Component Value Date   WBC 4.8 05/02/2023   HGB 15.7 05/02/2023   HCT 46.1 05/02/2023   MCV 84.1 05/02/2023   PLT 55 (L) 05/02/2023      Chemistry      Component Value Date/Time   NA 138 10/29/2022 0922   NA 140 08/17/2015 1114   K 3.9 10/29/2022 0922   K 4.1 08/17/2015 1114   CL 102 10/29/2022 0922   CL 106 12/15/2012 0948   CO2 29 10/29/2022 0922   CO2 26 08/17/2015 1114   BUN 11 10/29/2022 0922   BUN 10.3 08/17/2015 1114   CREATININE 1.06 10/29/2022 0922   CREATININE 1.2 08/17/2015 1114      Component Value Date/Time   CALCIUM 9.9 10/29/2022 0922   CALCIUM 9.4 08/17/2015 1114   ALKPHOS 62 10/29/2022 0922   ALKPHOS 67 08/17/2015 1114   AST 29 10/29/2022 0922   AST 24 08/17/2015 1114   ALT 22 10/29/2022 0922   ALT 30 08/17/2015 1114   BILITOT 0.5 10/29/2022 0922   BILITOT 0.60 08/17/2015 1114       RADIOGRAPHIC STUDIES:  No results found.   ASSESSMENT/PLAN:  Joseph Lindsey is a 52 y.o. male who presents to the clinic for continued management of ITP.   #ITP: --Diagnosed in July 2011 and managed up to date by Joseph Lindsey. He will transferred care to Joseph Lindsey in [facility] 2024.  ----Baseline platelet counts are between 50-60K and has not required any intervention.  --Prior workup includes CT abdomen from 06/22/2013 showed no acute abnormality including remarkable liver and spleen.  --Lab today show stable platelet count of 64K. No other cytopenias. Immature platelet fraction was elevated at 32.6%.  --Continue on observation.  --RTC in 6 months with repeat labs.   # Dysuria -- The patient reports burning with urination after eating spicy food for the last 3 months. -- He  denies any systemic symptoms such as fevers, chills, malodorous urine, or cloudy urine. -- Will arrange for urinalysis and urine culture to be performed today.  If suspicious for infection I will send antibiotics to the pharmacy.  The patient denied any allergies or intolerance to any antibiotics.      No orders of the defined types were placed in this encounter.    The total time spent in the appointment was 20-29 minutes  Arpi Diebold L Tanvir Hipple, PA-C 10/28/23

## 2023-10-31 ENCOUNTER — Encounter: Payer: Self-pay | Admitting: Physician Assistant

## 2023-10-31 ENCOUNTER — Inpatient Hospital Stay: Payer: BC Managed Care – PPO | Attending: Physician Assistant

## 2023-10-31 ENCOUNTER — Other Ambulatory Visit: Payer: Self-pay

## 2023-10-31 ENCOUNTER — Inpatient Hospital Stay (HOSPITAL_BASED_OUTPATIENT_CLINIC_OR_DEPARTMENT_OTHER): Payer: BC Managed Care – PPO | Admitting: Physician Assistant

## 2023-10-31 ENCOUNTER — Other Ambulatory Visit: Payer: Self-pay | Admitting: Physician Assistant

## 2023-10-31 VITALS — BP 129/82 | HR 52 | Temp 97.9°F | Resp 16 | Wt 170.3 lb

## 2023-10-31 DIAGNOSIS — D693 Immune thrombocytopenic purpura: Secondary | ICD-10-CM | POA: Insufficient documentation

## 2023-10-31 DIAGNOSIS — D696 Thrombocytopenia, unspecified: Secondary | ICD-10-CM | POA: Diagnosis not present

## 2023-10-31 DIAGNOSIS — R300 Dysuria: Secondary | ICD-10-CM

## 2023-10-31 LAB — CBC WITH DIFFERENTIAL (CANCER CENTER ONLY)
Abs Immature Granulocytes: 0.01 10*3/uL (ref 0.00–0.07)
Basophils Absolute: 0 10*3/uL (ref 0.0–0.1)
Basophils Relative: 1 %
Eosinophils Absolute: 0.1 10*3/uL (ref 0.0–0.5)
Eosinophils Relative: 1 %
HCT: 46.4 % (ref 39.0–52.0)
Hemoglobin: 15.2 g/dL (ref 13.0–17.0)
Immature Granulocytes: 0 %
Lymphocytes Relative: 39 %
Lymphs Abs: 2.1 10*3/uL (ref 0.7–4.0)
MCH: 27.8 pg (ref 26.0–34.0)
MCHC: 32.8 g/dL (ref 30.0–36.0)
MCV: 85 fL (ref 80.0–100.0)
Monocytes Absolute: 0.6 10*3/uL (ref 0.1–1.0)
Monocytes Relative: 11 %
Neutro Abs: 2.6 10*3/uL (ref 1.7–7.7)
Neutrophils Relative %: 48 %
Platelet Count: 64 10*3/uL — ABNORMAL LOW (ref 150–400)
RBC: 5.46 MIL/uL (ref 4.22–5.81)
RDW: 14 % (ref 11.5–15.5)
WBC Count: 5.4 10*3/uL (ref 4.0–10.5)
nRBC: 0 % (ref 0.0–0.2)

## 2023-10-31 LAB — URINALYSIS, COMPLETE (UACMP) WITH MICROSCOPIC
Bacteria, UA: NONE SEEN
Bilirubin Urine: NEGATIVE
Glucose, UA: NEGATIVE mg/dL
Hgb urine dipstick: NEGATIVE
Ketones, ur: 5 mg/dL — AB
Leukocytes,Ua: NEGATIVE
Nitrite: NEGATIVE
Protein, ur: NEGATIVE mg/dL
Specific Gravity, Urine: 1.015 (ref 1.005–1.030)
pH: 7 (ref 5.0–8.0)

## 2023-10-31 LAB — IMMATURE PLATELET FRACTION: Immature Platelet Fraction: 32.6 % — ABNORMAL HIGH (ref 1.2–8.6)

## 2023-11-01 LAB — URINE CULTURE: Culture: NO GROWTH

## 2023-11-04 ENCOUNTER — Ambulatory Visit (INDEPENDENT_AMBULATORY_CARE_PROVIDER_SITE_OTHER): Payer: BC Managed Care – PPO | Admitting: Family Medicine

## 2023-11-04 ENCOUNTER — Encounter: Payer: Self-pay | Admitting: Family Medicine

## 2023-11-04 VITALS — BP 126/76 | HR 66 | Temp 98.2°F | Ht 69.69 in | Wt 167.1 lb

## 2023-11-04 DIAGNOSIS — Z125 Encounter for screening for malignant neoplasm of prostate: Secondary | ICD-10-CM

## 2023-11-04 DIAGNOSIS — Z131 Encounter for screening for diabetes mellitus: Secondary | ICD-10-CM

## 2023-11-04 DIAGNOSIS — Z0000 Encounter for general adult medical examination without abnormal findings: Secondary | ICD-10-CM | POA: Diagnosis not present

## 2023-11-04 DIAGNOSIS — E039 Hypothyroidism, unspecified: Secondary | ICD-10-CM

## 2023-11-04 DIAGNOSIS — Z1211 Encounter for screening for malignant neoplasm of colon: Secondary | ICD-10-CM

## 2023-11-04 DIAGNOSIS — Z789 Other specified health status: Secondary | ICD-10-CM | POA: Diagnosis not present

## 2023-11-04 DIAGNOSIS — Z13220 Encounter for screening for lipoid disorders: Secondary | ICD-10-CM

## 2023-11-04 DIAGNOSIS — E7800 Pure hypercholesterolemia, unspecified: Secondary | ICD-10-CM | POA: Diagnosis not present

## 2023-11-04 DIAGNOSIS — Z0184 Encounter for antibody response examination: Secondary | ICD-10-CM

## 2023-11-04 LAB — BASIC METABOLIC PANEL
BUN: 16 mg/dL (ref 6–23)
CO2: 27 meq/L (ref 19–32)
Calcium: 9.6 mg/dL (ref 8.4–10.5)
Chloride: 100 meq/L (ref 96–112)
Creatinine, Ser: 1.05 mg/dL (ref 0.40–1.50)
GFR: 82.14 mL/min (ref >60.00)
Glucose, Bld: 79 mg/dL (ref 70–99)
Potassium: 3.9 meq/L (ref 3.5–5.1)
Sodium: 135 meq/L (ref 135–145)

## 2023-11-04 LAB — HEPATIC FUNCTION PANEL
ALT: 26 U/L (ref 0–53)
AST: 30 U/L (ref 0–37)
Albumin: 4.4 g/dL (ref 3.5–5.2)
Alkaline Phosphatase: 52 U/L (ref 39–117)
Bilirubin, Direct: 0.1 mg/dL (ref 0.0–0.3)
Total Bilirubin: 0.6 mg/dL (ref 0.2–1.2)
Total Protein: 7.1 g/dL (ref 6.0–8.3)

## 2023-11-04 LAB — LIPID PANEL
Cholesterol: 258 mg/dL — ABNORMAL HIGH (ref 0–200)
HDL: 55.1 mg/dL (ref >39.00)
LDL Cholesterol: 188 mg/dL — ABNORMAL HIGH (ref 0–99)
NonHDL: 203.14
Total CHOL/HDL Ratio: 5
Triglycerides: 75 mg/dL (ref 0.0–149.0)
VLDL: 15 mg/dL (ref 0.0–40.0)

## 2023-11-04 LAB — TSH: TSH: 1.32 u[IU]/mL (ref 0.35–5.50)

## 2023-11-04 LAB — PSA: PSA: 0.45 ng/mL (ref 0.10–4.00)

## 2023-11-04 LAB — HEMOGLOBIN A1C: Hgb A1c MFr Bld: 5.6 % (ref 4.6–6.5)

## 2023-11-04 NOTE — Progress Notes (Signed)
Established Patient Office Visit  Subjective   Patient ID: Joseph Lindsey, male    DOB: 04-02-1971  Age: 52 y.o. MRN: [id number]  No chief complaint on file.   HPI   Bailor is seen for physical exam.  He has history of hypothyroidism and also chronic thrombocytopenia.  Followed by hematology.  Recent platelet count 65,000.  He remains on levothyroxine 100 mcg daily.  He has intentionally lost few pounds this year.  His scale back bread and starch intake somewhat.  He exercises few times per week.  Generally feels well.  Health maintenance reviewed  -Declines flu vaccination -Prior hepatitis C screen normal -Tetanus due 2031 -No history of Shingrix.  He is not certain if he has had chickenpox previously -Due for colonoscopy this year  Family history significant for father with hypertension diabetes.  Sister with diabetes.  Brother had gastric cancer age 68   Social history-he is married.  Non-smoker.  No alcohol use. Has 2 children.  Oldest is 66.    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  Past Medical History:  Diagnosis Date   Fissure, anal    2003   Migraines    Primary thrombocytopenia, unspecified    Thyroid disease    hypothyroid   Past Surgical History:  Procedure Laterality Date   BREATH TEK H PYLORI N/A 11/11/2013   Procedure: BREATH TEK Richardo Priest;  Surgeon: Beverley Fiedler, MD;  Location: [facility];  Service: Gastroenterology;  Laterality: N/A;    reports that he has never smoked. He has never used smokeless tobacco. He reports current alcohol use. He reports that he does not use drugs. family history includes Asthma in his brother, father, and sister; Cancer (age of onset: 62) in his brother; Diabetes in his father and sister; Hypertension in his father; Sickle cell trait in an other family member; Stomach cancer in his brother. No Known Allergies   Review of Systems  Constitutional:  Negative for chills, fever and malaise/fatigue.  HENT:  Negative  for hearing loss.   Eyes:  Negative for blurred vision and double vision.  Respiratory:  Negative for cough and shortness of breath.   Cardiovascular:  Negative for chest pain, palpitations and leg swelling.  Gastrointestinal:  Negative for abdominal pain, blood in stool, constipation and diarrhea.  Genitourinary:  Negative for dysuria.  Skin:  Negative for rash.  Neurological:  Negative for dizziness, speech change, seizures, loss of consciousness and headaches.  Psychiatric/Behavioral:  Negative for depression.       Objective:     BP 126/76 (BP Location: Left Arm, Patient Position: Sitting, Cuff Size: Normal)   Pulse 66   Temp 98.2 F (36.8 C) (Oral)   Ht 5' 9.69" (1.77 m)   Wt 167 lb 1.6 oz (75.8 kg)   SpO2 98%   BMI 24.19 kg/m  BP Readings from Last 3 Encounters:  11/04/23 126/76  10/31/23 129/82  05/02/23 124/65   Wt Readings from Last 3 Encounters:  11/04/23 167 lb 1.6 oz (75.8 kg)  10/31/23 170 lb 4.8 oz (77.2 kg)  05/02/23 170 lb 5 oz (77.3 kg)      Physical Exam Vitals reviewed.  Constitutional:      General: He is not in acute distress.    Appearance: He is well-developed. He is not ill-appearing.  HENT:     Head: Normocephalic and atraumatic.     Right Ear: External ear normal.     Left Ear: External ear normal.  Eyes:  Pupils: Pupils are equal, round, and reactive to light.  Neck:     Thyroid: No thyromegaly.  Cardiovascular:     Rate and Rhythm: Normal rate and regular rhythm.     Heart sounds: Normal heart sounds. No murmur heard.    No gallop.  Pulmonary:     Effort: No respiratory distress.     Breath sounds: No wheezing or rales.  Abdominal:     General: Bowel sounds are normal. There is no distension.     Palpations: Abdomen is soft. There is no mass.     Tenderness: There is no abdominal tenderness. There is no guarding or rebound.  Musculoskeletal:     Cervical back: Normal range of motion and neck supple.     Right lower leg: No  edema.     Left lower leg: No edema.  Lymphadenopathy:     Cervical: No cervical adenopathy.  Skin:    Findings: No rash.  Neurological:     Mental Status: He is alert and oriented to person, place, and time.     Cranial Nerves: No cranial nerve deficit.      No results found for any visits on 11/04/23.  Last CBC Lab Results  Component Value Date   WBC 5.4 10/31/2023   HGB 15.2 10/31/2023   HCT 46.4 10/31/2023   MCV 85.0 10/31/2023   MCH 27.8 10/31/2023   RDW 14.0 10/31/2023   PLT 64 (L) 10/31/2023   Last metabolic panel Lab Results  Component Value Date   GLUCOSE 67 (L) 10/29/2022   NA 138 10/29/2022   K 3.9 10/29/2022   CL 102 10/29/2022   CO2 29 10/29/2022   BUN 11 10/29/2022   CREATININE 1.06 10/29/2022   GFR 81.80 10/29/2022   CALCIUM 9.9 10/29/2022   PROT 7.7 10/29/2022   ALBUMIN 4.8 10/29/2022   BILITOT 0.5 10/29/2022   ALKPHOS 62 10/29/2022   AST 29 10/29/2022   ALT 22 10/29/2022   ANIONGAP 5 03/02/2021   Last lipids Lab Results  Component Value Date   CHOL 232 (H) 10/29/2022   HDL 57.20 10/29/2022   LDLCALC 165 (H) 10/29/2022   LDLDIRECT 146.8 05/06/2013   TRIG 49.0 10/29/2022   CHOLHDL 4 10/29/2022   Last hemoglobin A1c Lab Results  Component Value Date   HGBA1C 5.7 10/29/2022   Last thyroid functions Lab Results  Component Value Date   TSH 2.22 10/29/2022      The 10-year ASCVD risk score (Arnett DK, et al., 2019) is: 5.4%    Assessment & Plan:   Problem List Items Addressed This Visit       Unprioritized   Hypothyroidism   Relevant Orders   TSH   Other Visit Diagnoses     Physical exam    -  Primary   Relevant Orders   Basic metabolic panel   Lipid panel   Hepatic function panel   Hemoglobin A1c   Colon cancer screening       Relevant Orders   Ambulatory referral to Gastroenterology   Prostate cancer screening       Relevant Orders   PSA   Immunity status testing       Relevant Orders   Varicella Zoster  Antibody, IgG     52 year old male with history of chronic thrombocytopenia and hypothyroidism.  He had recent CBC per hematology and those were results were reviewed with patient.  We discussed the following health maintenance items  -Offered flu vaccine but he  declines -Needs follow-up repeat colonoscopy.  Last colonoscopy 10 years ago.  Referral placed -Patient had questions regarding his varicella immune status.  Check varicella IgG antibody. -Check A1c with family history of type 2 diabetes -Continue regular exercise habits  No follow-ups on file.    [name], MD

## 2023-11-05 LAB — VARICELLA ZOSTER ANTIBODY, IGG: Varicella IgG: 7.79 {s_co_ratio}

## 2023-11-05 NOTE — Addendum Note (Signed)
Addended by: Christy Sartorius on: 11/05/2023 08:25 AM   Modules accepted: Orders

## 2023-11-10 ENCOUNTER — Encounter: Payer: Self-pay | Admitting: Internal Medicine

## 2023-11-17 ENCOUNTER — Telehealth: Payer: Self-pay | Admitting: Family Medicine

## 2023-11-17 NOTE — Telephone Encounter (Signed)
Pt called, returning CMA's call. CMA was with a patient. Pt asked that CMA call back at his earliest convenience. 

## 2023-11-20 NOTE — Telephone Encounter (Signed)
Please see result note 

## 2023-11-25 ENCOUNTER — Telehealth: Payer: Self-pay | Admitting: Family Medicine

## 2023-11-25 MED ORDER — TRIAMCINOLONE ACETONIDE 0.1 % EX CREA: TOPICAL_CREAM | CUTANEOUS | 2 refills | Status: DC

## 2023-11-25 NOTE — Telephone Encounter (Signed)
Rx sent 

## 2023-11-25 NOTE — Telephone Encounter (Signed)
Prescription Request  11/25/2023  LOV: 11/04/2023  What is the name of the medication or equipment? triamcinolone cream (KENALOG) 0.1 %   Have you contacted your pharmacy to request a refill? No  Which pharmacy would you like this sent to?   [id number] [place], [place] -  [address] [place] [address] Phone: [phone] Fax: [phone]    Patient notified that their request is being sent to the clinical staff for review and that they should receive a response within 2 business days.   Please advise at Mobile [phone] (mobile)

## 2023-12-08 ENCOUNTER — Other Ambulatory Visit: Payer: Self-pay | Admitting: Family Medicine

## 2023-12-19 ENCOUNTER — Ambulatory Visit (AMBULATORY_SURGERY_CENTER): Payer: BC Managed Care – PPO | Admitting: *Deleted

## 2023-12-19 VITALS — Ht 69.0 in | Wt 177.0 lb

## 2023-12-19 DIAGNOSIS — Z1211 Encounter for screening for malignant neoplasm of colon: Secondary | ICD-10-CM

## 2023-12-19 MED ORDER — NA SULFATE-K SULFATE-MG SULF 17.5-3.13-1.6 GM/177ML PO SOLN: 1.0000 | Freq: Once | ORAL | 0 refills | Status: AC

## 2023-12-19 NOTE — Progress Notes (Signed)
 Pt's name and DOB verified at the beginning of the pre-visit wit 2 identifiers  Pt denies any difficulty with ambulating,sitting, laying down or rolling side to side  Pt has no issues with ambulation   Pt has no issues moving head neck or swallowing  No egg or soy allergy known to patient   No issues known to pt with past sedation with any surgeries or procedures  Patient denies ever being intubated  No FH of Malignant Hyperthermia  Pt is not on diet pills or shots  Pt is not on home 02   Pt is not on blood thinners   Pt denies issues with constipation   Pt is not on dialysis  Pt denise any abnormal heart rhythms   Pt denies any upcoming cardiac testing  Pt encouraged to use to use Singlecare or Goodrx to reduce cost   Patient's chart reviewed by Norleen Schillings CNRA prior to pre-visit and patient appropriate for the [facility].  Pre-visit completed and red dot placed by patient's name on their procedure day (on provider's schedule).  .  Visit by phone  Pt states weight is 177 lb Pt has allergy to soy   Instructed pt why it is important to and  to call if they have any changes in health or new medications. Directed them to the # given and on instructions.     Instructions reviewed. Pt given both [facility] main # and MD on call # prior to instructions.  Pt states understanding. Instructed to review again prior to procedure. Pt states they will.   Instructions sent by mail with coupon and by My Chart  Coupon sent via text to mobile phone and pt verified they received it

## 2024-01-08 ENCOUNTER — Encounter: Payer: Self-pay | Admitting: Internal Medicine

## 2024-01-16 ENCOUNTER — Encounter: Payer: Self-pay | Admitting: Internal Medicine

## 2024-01-16 ENCOUNTER — Ambulatory Visit (AMBULATORY_SURGERY_CENTER): Payer: BC Managed Care – PPO | Admitting: Internal Medicine

## 2024-01-16 VITALS — BP 121/72 | HR 54 | Temp 98.1°F | Resp 15 | Ht 69.0 in | Wt 177.0 lb

## 2024-01-16 DIAGNOSIS — Z1211 Encounter for screening for malignant neoplasm of colon: Secondary | ICD-10-CM

## 2024-01-16 DIAGNOSIS — D122 Benign neoplasm of ascending colon: Secondary | ICD-10-CM

## 2024-01-16 MED ORDER — SODIUM CHLORIDE 0.9 % IV SOLN: 500.0000 mL | Freq: Once | INTRAVENOUS | Status: AC

## 2024-01-16 NOTE — Progress Notes (Signed)
 Sedate, gd SR, tolerated procedure well, VSS, report to RN

## 2024-01-16 NOTE — Op Note (Signed)
 [facility] Patient Name: Joseph Lindsey Procedure Date: 01/16/2024 2:38 PM MRN: [id number] Endoscopist: Gordy CHRISTELLA Starch , MD, [id number] Age: 53 Referring MD:  Date of Birth: November 01, 1971 Gender: Male Account #: [id number] Procedure:                Colonoscopy Indications:              Screening for colorectal malignant neoplasm, Last                            colonoscopy 10 years ago Medicines:                Monitored Anesthesia Care Procedure:                Pre-Anesthesia Assessment:                           - Prior to the procedure, a History and Physical                            was performed, and patient medications and                            allergies were reviewed. The patient's tolerance of                            previous anesthesia was also reviewed. The risks                            and benefits of the procedure and the sedation                            options and risks were discussed with the patient.                            All questions were answered, and informed consent                            was obtained. Prior Anticoagulants: The patient has                            taken no anticoagulant or antiplatelet agents. ASA                            Grade Assessment: II - A patient with mild systemic                            disease. After reviewing the risks and benefits,                            the patient was deemed in satisfactory condition to                            undergo the procedure.  After obtaining informed consent, the colonoscope                            was passed under direct vision. Throughout the                            procedure, the patient's blood pressure, pulse, and                            oxygen saturations were monitored continuously. The                            CF [id number] #[id number] was introduced through the anus                            and advanced to the cecum,  identified by                            appendiceal orifice and ileocecal valve. The                            colonoscopy was performed without difficulty. The                            patient tolerated the procedure well. The quality                            of the bowel preparation was good. The ileocecal                            valve, appendiceal orifice, and rectum were                            photographed. Scope In: 2:54:05 PM Scope Out: 3:05:58 PM Scope Withdrawal Time: 0 hours 8 minutes 40 seconds  Total Procedure Duration: 0 hours 11 minutes 53 seconds  Findings:                 The digital rectal exam was normal.                           A 4 mm polyp was found in the ascending colon. The                            polyp was sessile. The polyp was removed with a                            cold snare. Resection and retrieval were complete.                           The exam was otherwise without abnormality on                            direct and retroflexion views. Complications:  No immediate complications. Estimated Blood Loss:     Estimated blood loss: none. Impression:               - One 4 mm polyp in the ascending colon, removed                            with a cold snare. Resected and retrieved.                           - The examination was otherwise normal on direct                            and retroflexion views. Recommendation:           - Patient has a contact number available for                            emergencies. The signs and symptoms of potential                            delayed complications were discussed with the                            patient. Return to normal activities tomorrow.                            Written discharge instructions were provided to the                            patient.                           - Resume previous diet.                           - Continue present medications.                            - Await pathology results.                           - Repeat colonoscopy is recommended. The                            colonoscopy date will be determined after pathology                            results from today's exam become available for                            review. Gordy CHRISTELLA Starch, MD 01/16/2024 3:09:04 PM This report has been signed electronically.

## 2024-01-16 NOTE — Patient Instructions (Signed)
 YOU HAD AN ENDOSCOPIC PROCEDURE TODAY AT THE [facility]:   Refer to the procedure report that was given to you for any specific questions about what was found during the examination.  If the procedure report does not answer your questions, please call your gastroenterologist to clarify.  If you requested that your care partner not be given the details of your procedure findings, then the procedure report has been included in a sealed envelope for you to review at your convenience later.  YOU SHOULD EXPECT: Some feelings of bloating in the abdomen. Passage of more gas than usual.  Walking can help get rid of the air that was put into your GI tract during the procedure and reduce the bloating. If you had a lower endoscopy (such as a colonoscopy or flexible sigmoidoscopy) you may notice spotting of blood in your stool or on the toilet paper. If you underwent a bowel prep for your procedure, you may not have a normal bowel movement for a few days.  Please Note:  You might notice some irritation and congestion in your nose or some drainage.  This is from the oxygen used during your procedure.  There is no need for concern and it should clear up in a day or so.  SYMPTOMS TO REPORT IMMEDIATELY:  Following lower endoscopy (colonoscopy or flexible sigmoidoscopy):  Excessive amounts of blood in the stool  Significant tenderness or worsening of abdominal pains  Swelling of the abdomen that is new, acute  Fever of 100F or higher    For urgent or emergent issues, a gastroenterologist can be reached at any hour by calling [phone]. Do not use MyChart messaging for urgent concerns.    DIET:  We do recommend a small meal at first, but then you may proceed to your regular diet.  Drink plenty of fluids but you should avoid alcoholic beverages for 24 hours.  MEDICATIONS: Continue present medications.  FOLLOW UP: Await pathology results. Repeat colonoscopy is recommended. The colonoscopy  date will be determined after pathology results from today's exam become available for review.  Please see handouts given to you by your recovery nurse: Polyps.  Thank you for allowing us  to provide for your healthcare needs today.  ACTIVITY:  You should plan to take it easy for the rest of today and you should NOT DRIVE or use heavy machinery until tomorrow (because of the sedation medicines used during the test).    FOLLOW UP: Our staff will call the number listed on your records the next business day following your procedure.  We will call around 7:15- 8:00 am to check on you and address any questions or concerns that you may have regarding the information given to you following your procedure. If we do not reach you, we will leave a message.     If any biopsies were taken you will be contacted by phone or by letter within the next 1-3 weeks.  Please call us  at [phone] if you have not heard about the biopsies in 3 weeks.    SIGNATURES/CONFIDENTIALITY: You and/or your care partner have signed paperwork which will be entered into your electronic medical record.  These signatures attest to the fact that that the information above on your After Visit Summary has been reviewed and is understood.  Full responsibility of the confidentiality of this discharge information lies with you and/or your care-partner.

## 2024-01-16 NOTE — Progress Notes (Signed)
 Pt's states no medical or surgical changes since previsit or office visit.

## 2024-01-16 NOTE — Progress Notes (Signed)
 GASTROENTEROLOGY PROCEDURE H&P NOTE   Primary Care Physician: Micheal Wolm ORN, MD    Reason for Procedure:  Colon cancer screening  Plan:    colonoscopy  Patient is appropriate for endoscopic procedure(s) in the ambulatory ([facility]) setting.  The nature of the procedure, as well as the risks, benefits, and alternatives were carefully and thoroughly reviewed with the patient. Ample time for discussion and questions allowed. The patient understood, was satisfied, and agreed to proceed.     HPI: Joseph Lindsey is a 53 y.o. male who presents for screening colonoscopy.  Medical history as below.  Tolerated the prep.  No recent chest pain or shortness of breath.  No abdominal pain today.  Past Medical History:  Diagnosis Date   Arthritis    Asthma    Fissure, anal    2003   Hyperlipidemia    Migraines    Primary thrombocytopenia, unspecified    Thyroid  disease    hypothyroid    Past Surgical History:  Procedure Laterality Date   BREATH TEK H PYLORI N/A 11/11/2013   Procedure: BREATH TEK VEAR LORA;  Surgeon: Gordy CHRISTELLA Starch, MD;  Location: [facility];  Service: Gastroenterology;  Laterality: N/A;   COLONOSCOPY      Prior to Admission medications   Medication Sig Start Date End Date Taking? Authorizing Provider  fexofenadine  (ALLEGRA) 180 MG tablet Take 180 mg by mouth daily. Patient not taking: Reported on 12/19/2023    [provider]  levothyroxine  (SYNTHROID ) 100 MCG tablet TAKE 1 TABLET DAILY 12/09/23   Burchette, Wolm ORN, MD  Multiple Vitamin (MULTIVITAMIN) capsule Take 1 capsule by mouth daily.    [provider]  triamcinolone  cream (KENALOG ) 0.1 % APPLY TOPICALLY TWICE A DAY AS NEEDED 11/25/23   Burchette, Wolm ORN, MD    Current Outpatient Medications  Medication Sig Dispense Refill   fexofenadine  (ALLEGRA) 180 MG tablet Take 180 mg by mouth daily. (Patient not taking: Reported on 12/19/2023)     levothyroxine  (SYNTHROID ) 100 MCG tablet TAKE  1 TABLET DAILY 90 tablet 3   Multiple Vitamin (MULTIVITAMIN) capsule Take 1 capsule by mouth daily.     triamcinolone  cream (KENALOG ) 0.1 % APPLY TOPICALLY TWICE A DAY AS NEEDED 30 g 2   Current Facility-Administered Medications  Medication Dose Route Frequency Provider Last Rate Last Admin   0.9 %  sodium chloride  infusion  500 mL Intravenous Once Buster Schueller, Gordy CHRISTELLA, MD        Allergies as of 01/16/2024 - Review Complete 12/19/2023  Allergen Reaction Noted   Soybean-containing drug products  12/19/2023    Family History  Problem Relation Age of Onset   Hypertension Father    Diabetes Father    Asthma Father    Diabetes Sister    Asthma Sister    Asthma Brother    Cancer Brother 75       gastric cancer   Stomach cancer Brother    Sickle cell trait Other        mother   Colon cancer Neg Hx    Esophageal cancer Neg Hx    Rectal cancer Neg Hx    Colon polyps Neg Hx     Social History   Socioeconomic History   Marital status: Married    Spouse name: Not on file   Number of children: 1   Years of education: Not on file   Highest education level: Not on file  Occupational History    Employer: [employer]  Tobacco Use   Smoking status: Never   Smokeless tobacco: Never  Vaping Use   Vaping status: Never Used  Substance and Sexual Activity   Alcohol use: Yes    Comment: once a week   Drug use: No   Sexual activity: Not on file  Other Topics Concern   Not on file  Social History Narrative   Not on file   Social Drivers of Health   Financial Resource Strain: Not on file  Food Insecurity: Not on file  Transportation Needs: Not on file  Physical Activity: Not on file  Stress: Not on file  Social Connections: Not on file  Intimate Partner Violence: Not on file    Physical Exam: Vital signs in last 24 hours: @There  were no vitals taken for this visit. GEN: NAD EYE: Sclerae anicteric ENT: MMM CV: Non-tachycardic Pulm: CTA b/l GI: Soft, NT/ND NEURO:   Alert & Oriented x 3   Gordy Starch, MD [facility]  01/16/2024 2:29 PM

## 2024-01-16 NOTE — Progress Notes (Signed)
 Called to room to assist during endoscopic procedure.  Patient ID and intended procedure confirmed with present staff. Received instructions for my participation in the procedure from the performing physician.

## 2024-01-19 ENCOUNTER — Telehealth: Payer: Self-pay | Admitting: *Deleted

## 2024-01-19 NOTE — Telephone Encounter (Signed)
  Follow up Call-     01/16/2024    2:31 PM  Call back number  Post procedure Call Back phone  # [phone]  Permission to leave phone message Yes     Patient questions:  Do you have a fever, pain , or abdominal swelling? No. Pain Score  0 *  Have you tolerated food without any problems? Yes.    Have you been able to return to your normal activities? Yes.    Do you have any questions about your discharge instructions: Diet   No. Medications  No. Follow up visit  No.  Do you have questions or concerns about your Care? No.  Actions: * If pain score is 4 or above: No action needed, pain <4.

## 2024-01-22 ENCOUNTER — Encounter: Payer: Self-pay | Admitting: Internal Medicine

## 2024-01-22 LAB — SURGICAL PATHOLOGY

## 2024-04-23 ENCOUNTER — Encounter: Payer: Self-pay | Admitting: Family Medicine

## 2024-04-23 ENCOUNTER — Ambulatory Visit (INDEPENDENT_AMBULATORY_CARE_PROVIDER_SITE_OTHER): Admitting: Family Medicine

## 2024-04-23 VITALS — BP 134/80 | HR 55 | Temp 98.3°F | Wt 172.2 lb

## 2024-04-23 DIAGNOSIS — L97511 Non-pressure chronic ulcer of other part of right foot limited to breakdown of skin: Secondary | ICD-10-CM

## 2024-04-23 DIAGNOSIS — L84 Corns and callosities: Secondary | ICD-10-CM | POA: Diagnosis not present

## 2024-04-23 NOTE — Progress Notes (Signed)
 Established Patient Office Visit  Subjective   Patient ID: Joseph Lindsey, male    DOB: March 05, 1971  Age: 54 y.o. MRN: [id number]  Chief Complaint  Patient presents with   Foot Problem    HPI   Joseph Lindsey is seen with small ulcerative area right lateral foot which he states has been present for almost 3 months.  He states this area was slightly raised as a "bump "and then he abraded this down slightly and now has superficial ulcer.  No significant bleeding.  No history of reported injury.  No purulent secretions.  No surrounding redness.  No recent change of shoewear.  Only minimal associated discomfort.  No history of diabetes.  He had A1c last fall that was normal range.  Past Medical History:  Diagnosis Date   Arthritis    Asthma    Fissure, anal    2003   Hyperlipidemia    Migraines    Primary thrombocytopenia, unspecified    Thyroid  disease    hypothyroid   Past Surgical History:  Procedure Laterality Date   BREATH TEK H PYLORI N/A 11/11/2013   Procedure: BREATH TEK Joseph Lindsey;  Surgeon: Nannette Babe, MD;  Location: [facility];  Service: Gastroenterology;  Laterality: N/A;   COLONOSCOPY      reports that he has never smoked. He has never used smokeless tobacco. He reports current alcohol use. He reports that he does not use drugs. family history includes Asthma in his brother, father, and sister; Cancer (age of onset: 64) in his brother; Diabetes in his father and sister; Hypertension in his father; Sickle cell trait in an other family member; Stomach cancer in his brother. Allergies  Allergen Reactions   Soybean-Containing Drug Products     Sinus issues and throat irritation     Review of Systems  Constitutional:  Negative for chills and fever.      Objective:     BP 134/80 (BP Location: Left Arm, Patient Position: Sitting, Cuff Size: Normal)   Pulse (!) 55   Temp 98.3 F (36.8 C) (Oral)   Wt 172 lb 3.2 oz (78.1 kg)   SpO2 98%   BMI 25.43 kg/m  BP  Readings from Last 3 Encounters:  04/23/24 134/80  01/16/24 121/72  11/04/23 126/76   Wt Readings from Last 3 Encounters:  04/23/24 172 lb 3.2 oz (78.1 kg)  01/16/24 177 lb (80.3 kg)  12/19/23 177 lb (80.3 kg)      Physical Exam Vitals reviewed.  Constitutional:      General: He is not in acute distress.    Appearance: He is not ill-appearing.  Skin:    Comments: Right lateral foot reveals 4 x 5 mm superficial ulcer.  No necrosis.  Does have callused rim of tissue around the periphery.  Neurological:     Mental Status: He is alert.      No results found for any visits on 04/23/24.    The 10-year ASCVD risk score (Arnett DK, et al., 2019) is: 6.6%    Assessment & Plan:   Small ulcer right lateral foot near the center of a callused area.  Suspect probably related to recent callus.  No signs of secondary infection.  No suggestion of malignancy from appearance.  We recommended trimming this down with #15 blade to trim down the hardened callus tissue.  Patient was aware of risk of bleeding.  Consented.  We trimmed down with #15 blade some hardened callus tissue around the periphery  of the small superficial ulcer.  We then applied a thin DuoDERM strip over this area for protection and hopefully to help promote healing.  Recommend removing DuoDERM in 4 to 5 days and cleaning thoroughly with soap and water and air dry then reapply DuoDERM and go through a few cycles of that and return in 3 weeks for reassessment  Glean Lamy, MD

## 2024-04-23 NOTE — Patient Instructions (Signed)
 In 4-5 days take off the Duoderm and clean with soap and water and reapply for another 5 days.  Set up follow up in 2-3 weeks.

## 2024-04-29 ENCOUNTER — Other Ambulatory Visit: Payer: Self-pay | Admitting: Hematology and Oncology

## 2024-04-29 DIAGNOSIS — Z862 Personal history of diseases of the blood and blood-forming organs and certain disorders involving the immune mechanism: Secondary | ICD-10-CM

## 2024-04-29 DIAGNOSIS — D696 Thrombocytopenia, unspecified: Secondary | ICD-10-CM

## 2024-04-29 NOTE — Progress Notes (Signed)
 [facility] [phone]   [phone]  PROGRESS NOTE  Patient Care Team: Marquetta Sit, MD as PCP - General  Hematological/Oncological History # ITP  Interval History:  Joseph Lindsey 53 y.o. male with medical history significant for chronic ITP who presents for a follow up visit. The patient's last visit was on 10/31/2023. In the interim since the last visit he has had no major changes in his health.   On exam today Joseph Lindsey reports he has been "all right in the interim since her last visit.  He is had no major changes in his health.  He denies any bleeding, bruising, or dark stools.  He reports he is "tired all the time".  He reports his energy today is about a 7 out of 10.  He notes his appetite is good.  He is not having any lightheadedness, dizziness, or shortness of breath.  He is not having any recent viral illnesses such as flu or coronavirus.  He reports that he does not have any upcoming planned dental work or surgeries.  He reports nothing else has been out of the ordinary.  He continues Synthroid  as prescribed by his primary care provider.  He is also interested in seeing a podiatrist due to recent foot issues.  Otherwise a 10 point ROS was negative.  MEDICAL HISTORY:  Past Medical History:  Diagnosis Date   Arthritis    Asthma    Fissure, anal    2003   Hyperlipidemia    Migraines    Primary thrombocytopenia, unspecified    Thyroid  disease    hypothyroid    SURGICAL HISTORY: Past Surgical History:  Procedure Laterality Date   BREATH TEK H PYLORI N/A 11/11/2013   Procedure: BREATH TEK Jorja Newport;  Surgeon: Nannette Babe, MD;  Location: [facility];  Service: Gastroenterology;  Laterality: N/A;   COLONOSCOPY      SOCIAL HISTORY: Social History   Socioeconomic History   Marital status: Married    Spouse name: Not on file   Number of children: 1   Years of education: Not on file   Highest education level: Not on  file  Occupational History    Employer: [employer]  Tobacco Use   Smoking status: Never   Smokeless tobacco: Never  Vaping Use   Vaping status: Never Used  Substance and Sexual Activity   Alcohol use: Yes    Comment: once a week   Drug use: No   Sexual activity: Not on file  Other Topics Concern   Not on file  Social History Narrative   Not on file   Social Drivers of Health   Financial Resource Strain: Not on file  Food Insecurity: Not on file  Transportation Needs: Not on file  Physical Activity: Not on file  Stress: Not on file  Social Connections: Not on file  Intimate Partner Violence: Not on file    FAMILY HISTORY: Family History  Problem Relation Age of Onset   Hypertension Father    Diabetes Father    Asthma Father    Diabetes Sister    Asthma Sister    Asthma Brother    Cancer Brother 57       gastric cancer   Stomach cancer Brother    Sickle cell trait Other        mother   Colon cancer Neg Hx    Esophageal cancer Neg Hx    Rectal cancer Neg Hx  Colon polyps Neg Hx     ALLERGIES:  is allergic to soybean-containing drug products.  MEDICATIONS:  Current Outpatient Medications  Medication Sig Dispense Refill   fexofenadine  (ALLEGRA) 180 MG tablet Take 180 mg by mouth daily.     levothyroxine  (SYNTHROID ) 100 MCG tablet TAKE 1 TABLET DAILY 90 tablet 3   Multiple Vitamin (MULTIVITAMIN) capsule Take 1 capsule by mouth daily.     triamcinolone  cream (KENALOG ) 0.1 % APPLY TOPICALLY TWICE A DAY AS NEEDED 30 g 2   Current Facility-Administered Medications  Medication Dose Route Frequency Provider Last Rate Last Admin   0.9 %  sodium chloride  infusion  500 mL Intravenous Once Pyrtle, Amber Bail, MD        REVIEW OF SYSTEMS:   Constitutional: ( - ) fevers, ( - )  chills , ( - ) night sweats Eyes: ( - ) blurriness of vision, ( - ) double vision, ( - ) watery eyes Ears, nose, mouth, throat, and face: ( - ) mucositis, ( - ) sore throat Respiratory: (  - ) cough, ( - ) dyspnea, ( - ) wheezes Cardiovascular: ( - ) palpitation, ( - ) chest discomfort, ( - ) lower extremity swelling Gastrointestinal:  ( - ) nausea, ( - ) heartburn, ( - ) change in bowel habits Skin: ( - ) abnormal skin rashes Lymphatics: ( - ) new lymphadenopathy, ( - ) easy bruising Neurological: ( - ) numbness, ( - ) tingling, ( - ) new weaknesses Behavioral/Psych: ( - ) mood change, ( - ) new changes  All other systems were reviewed with the patient and are negative.  PHYSICAL EXAMINATION:  Vitals:   04/30/24 0846  BP: 123/79  Pulse: (!) 51  Resp: 13  Temp: 98.4 F (36.9 C)  SpO2: 100%   Filed Weights   04/30/24 0846  Weight: 171 lb 3.2 oz (77.7 kg)    GENERAL: Well-appearing middle-age African-American male, alert, no distress and comfortable SKIN: skin color, texture, turgor are normal, no rashes or significant lesions EYES: conjunctiva are pink and non-injected, sclera clear LUNGS: clear to auscultation and percussion with normal breathing effort HEART: regular rate & rhythm and no murmurs and no lower extremity edema Musculoskeletal: no cyanosis of digits and no clubbing  PSYCH: alert & oriented x 3, fluent speech NEURO: no focal motor/sensory deficits  LABORATORY DATA:  I have reviewed the data as listed    Latest Ref Rng & Units 04/30/2024    8:25 AM 10/31/2023   10:30 AM 05/02/2023   11:03 AM  CBC  WBC 4.0 - 10.5 K/uL 5.4  5.4  4.8   Hemoglobin 13.0 - 17.0 g/dL 09.8  11.9  14.7   Hematocrit 39.0 - 52.0 % 46.8  46.4  46.1   Platelets 150 - 400 K/uL 69  64  55        Latest Ref Rng & Units 04/30/2024    8:25 AM 11/04/2023   11:51 AM 10/29/2022    9:22 AM  CMP  Glucose 70 - 99 mg/dL 81  79  67   BUN 6 - 20 mg/dL 11  16  11    Creatinine 0.61 - 1.24 mg/dL 8.29  5.62  1.30   Sodium 135 - 145 mmol/L 138  135  138   Potassium 3.5 - 5.1 mmol/L 4.2  3.9  3.9   Chloride 98 - 111 mmol/L 103  100  102   CO2 22 - 32 mmol/L 29  27  29   Calcium 8.9  - 10.3 mg/dL 9.9  9.6  9.9   Total Protein 6.5 - 8.1 g/dL 7.6  7.1  7.7   Total Bilirubin 0.0 - 1.2 mg/dL 0.4  0.6  0.5   Alkaline Phos 38 - 126 U/L 68  52  62   AST 15 - 41 U/L 26  30  29    ALT 0 - 44 U/L 23  26  22      No results found for: "MPROTEIN" No results found for: "KPAFRELGTCHN", "LAMBDASER", "KAPLAMBRATIO"   ASSESSMENT & PLAN Joseph Lindsey is a 53 y.o. male who presents to the clinic for continued management of ITP.    #ITP: --Diagnosed in July 2011 and managed up to date by Dr. Dirk Fredericks. He transferred care to Dr. Rosaline Coma --Baseline platelet counts are between 50-60K and has not required any intervention.  --Prior workup includes CT abdomen from 06/22/2013 showed no acute abnormality including remarkable liver and spleen.  --Lab today show WBC 5.4, Hgb 15.7, MCV 81.7, Plt 69  --Continue on observation.  --consider dex 40 mg PO daily x 4 days if Plt were to drop <30  --RTC in 6 months with repeat labs.     No orders of the defined types were placed in this encounter.   All questions were answered. The patient knows to call the clinic with any problems, questions or concerns.  A total of more than 25 minutes were spent on this encounter with face-to-face time and non-face-to-face time, including preparing to see the patient, ordering tests and/or medications, counseling the patient and coordination of care as outlined above.   Rogerio Clay, MD Department of Hematology/Oncology [facility] Phone: [phone] Pager: [phone] Email: [email or web address]  04/30/2024 9:45 AM

## 2024-04-30 ENCOUNTER — Inpatient Hospital Stay (HOSPITAL_BASED_OUTPATIENT_CLINIC_OR_DEPARTMENT_OTHER): Payer: BC Managed Care – PPO | Admitting: Hematology and Oncology

## 2024-04-30 ENCOUNTER — Inpatient Hospital Stay: Payer: BC Managed Care – PPO | Attending: Hematology and Oncology

## 2024-04-30 VITALS — BP 123/79 | HR 51 | Temp 98.4°F | Resp 13 | Wt 171.2 lb

## 2024-04-30 DIAGNOSIS — D696 Thrombocytopenia, unspecified: Secondary | ICD-10-CM

## 2024-04-30 DIAGNOSIS — D693 Immune thrombocytopenic purpura: Secondary | ICD-10-CM | POA: Diagnosis not present

## 2024-04-30 DIAGNOSIS — Z862 Personal history of diseases of the blood and blood-forming organs and certain disorders involving the immune mechanism: Secondary | ICD-10-CM

## 2024-04-30 DIAGNOSIS — Z800 Family history of malignant neoplasm of digestive organs: Secondary | ICD-10-CM | POA: Diagnosis not present

## 2024-04-30 LAB — CBC WITH DIFFERENTIAL (CANCER CENTER ONLY)
Abs Immature Granulocytes: 0.01 10*3/uL (ref 0.00–0.07)
Basophils Absolute: 0.1 10*3/uL (ref 0.0–0.1)
Basophils Relative: 1 %
Eosinophils Absolute: 0.1 10*3/uL (ref 0.0–0.5)
Eosinophils Relative: 2 %
HCT: 46.8 % (ref 39.0–52.0)
Hemoglobin: 15.7 g/dL (ref 13.0–17.0)
Immature Granulocytes: 0 %
Lymphocytes Relative: 37 %
Lymphs Abs: 2 10*3/uL (ref 0.7–4.0)
MCH: 27.4 pg (ref 26.0–34.0)
MCHC: 33.5 g/dL (ref 30.0–36.0)
MCV: 81.7 fL (ref 80.0–100.0)
Monocytes Absolute: 0.5 10*3/uL (ref 0.1–1.0)
Monocytes Relative: 9 %
Neutro Abs: 2.8 10*3/uL (ref 1.7–7.7)
Neutrophils Relative %: 51 %
Platelet Count: 69 10*3/uL — ABNORMAL LOW (ref 150–400)
RBC: 5.73 MIL/uL (ref 4.22–5.81)
RDW: 13.9 % (ref 11.5–15.5)
Smear Review: NORMAL
WBC Count: 5.4 10*3/uL (ref 4.0–10.5)
nRBC: 0 % (ref 0.0–0.2)

## 2024-04-30 LAB — CMP (CANCER CENTER ONLY)
ALT: 23 U/L (ref 0–44)
AST: 26 U/L (ref 15–41)
Albumin: 4.4 g/dL (ref 3.5–5.0)
Alkaline Phosphatase: 68 U/L (ref 38–126)
Anion gap: 6 (ref 5–15)
BUN: 11 mg/dL (ref 6–20)
CO2: 29 mmol/L (ref 22–32)
Calcium: 9.9 mg/dL (ref 8.9–10.3)
Chloride: 103 mmol/L (ref 98–111)
Creatinine: 1.2 mg/dL (ref 0.61–1.24)
GFR, Estimated: >60 mL/min (ref >60)
Glucose, Bld: 81 mg/dL (ref 70–99)
Potassium: 4.2 mmol/L (ref 3.5–5.1)
Sodium: 138 mmol/L (ref 135–145)
Total Bilirubin: 0.4 mg/dL (ref 0.0–1.2)
Total Protein: 7.6 g/dL (ref 6.5–8.1)

## 2024-05-14 ENCOUNTER — Other Ambulatory Visit: Payer: Self-pay

## 2024-05-14 ENCOUNTER — Emergency Department (HOSPITAL_COMMUNITY)
Admission: EM | Admit: 2024-05-14 | Discharge: 2024-05-14 | Disposition: A | Discharge status: Home / Self Care | Attending: Emergency Medicine | Admitting: Emergency Medicine

## 2024-05-14 ENCOUNTER — Encounter: Payer: Self-pay | Admitting: Family Medicine

## 2024-05-14 ENCOUNTER — Emergency Department (HOSPITAL_COMMUNITY)

## 2024-05-14 ENCOUNTER — Encounter (HOSPITAL_COMMUNITY): Payer: Self-pay

## 2024-05-14 ENCOUNTER — Ambulatory Visit (INDEPENDENT_AMBULATORY_CARE_PROVIDER_SITE_OTHER): Admitting: Family Medicine

## 2024-05-14 VITALS — BP 130/76 | HR 62 | Temp 98.1°F | Wt 170.2 lb

## 2024-05-14 DIAGNOSIS — R0789 Other chest pain: Secondary | ICD-10-CM | POA: Insufficient documentation

## 2024-05-14 DIAGNOSIS — R079 Chest pain, unspecified: Secondary | ICD-10-CM | POA: Diagnosis not present

## 2024-05-14 DIAGNOSIS — E039 Hypothyroidism, unspecified: Secondary | ICD-10-CM | POA: Diagnosis not present

## 2024-05-14 DIAGNOSIS — L84 Corns and callosities: Secondary | ICD-10-CM

## 2024-05-14 DIAGNOSIS — L97519 Non-pressure chronic ulcer of other part of right foot with unspecified severity: Secondary | ICD-10-CM | POA: Diagnosis not present

## 2024-05-14 DIAGNOSIS — Y92410 Unspecified street and highway as the place of occurrence of the external cause: Secondary | ICD-10-CM | POA: Diagnosis not present

## 2024-05-14 DIAGNOSIS — J45909 Unspecified asthma, uncomplicated: Secondary | ICD-10-CM | POA: Insufficient documentation

## 2024-05-14 MED ORDER — NAPROXEN 500 MG PO TABS: 500.0000 mg | ORAL_TABLET | Freq: Two times a day (BID) | ORAL | 0 refills | Status: AC

## 2024-05-14 MED ORDER — METHOCARBAMOL 500 MG PO TABS: 500.0000 mg | ORAL_TABLET | Freq: Three times a day (TID) | ORAL | 0 refills | Status: AC | PRN

## 2024-05-14 MED ORDER — LIDOCAINE 5 % EX PTCH: 1.0000 | MEDICATED_PATCH | CUTANEOUS | 0 refills | Status: AC
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
## 2024-05-14 NOTE — Discharge Instructions (Signed)
 You were evaluated in the Emergency Department and after careful evaluation, we did not find any emergent condition requiring admission or further testing in the hospital.  Your exam/testing today is overall reassuring.  Symptoms likely due to bruised ribs.  Recommend using the Naprosyn twice daily as prescribed for pain.  Can use the Robaxin muscle relaxer for more significant pain, best used at night if you are having trouble sleeping as it can cause drowsiness.  Can also use the numbing patches daily.  Please return to the Emergency Department if you experience any worsening of your condition.   Thank you for allowing us  to be a part of your care.

## 2024-05-14 NOTE — Progress Notes (Signed)
 Established Patient Office Visit  Subjective   Patient ID: Joseph Lindsey, male    DOB: [birth year]  Age: 53 y.o. MRN: [id number]  Chief Complaint  Patient presents with   Medical Management of Chronic Issues    HPI   Joseph Lindsey is here to reassess callus/ulcer right lateral foot.  We had trimmed this down the callus last visit and he has been applying DuoDERM for protection.  He has no history of diabetes or peripheral vascular disease.  Last A1c 5.6%.  He has not seen much change in ulcer at this time.  Perhaps slightly smaller in size.  Also complains of painful hyperkeratotic thickening dorsum left third toe just proximal to the nail  Past Medical History:  Diagnosis Date   Arthritis    Asthma    Fissure, anal    2003   Hyperlipidemia    Migraines    Primary thrombocytopenia, unspecified    Thyroid  disease    hypothyroid   Past Surgical History:  Procedure Laterality Date   BREATH TEK H PYLORI N/A 11/11/2013   Procedure: BREATH TEK Joseph Lindsey;  Surgeon: Nannette Babe, MD;  Location: [facility];  Service: Gastroenterology;  Laterality: N/A;   COLONOSCOPY      reports that he has never smoked. He has never used smokeless tobacco. He reports current alcohol use. He reports that he does not use drugs. family history includes Asthma in his brother, father, and sister; Cancer (age of onset: 7) in his brother; Diabetes in his father and sister; Hypertension in his father; Sickle cell trait in an other family member; Stomach cancer in his brother. Allergies  Allergen Reactions   Soybean-Containing Drug Products     Sinus issues and throat irritation     Review of Systems  Constitutional:  Negative for chills and fever.      Objective:     BP 130/76 (BP Location: Left Arm, Patient Position: Sitting, Cuff Size: Normal)   Pulse 62   Temp 98.1 F (36.7 C) (Oral)   Wt 170 lb 3.2 oz (77.2 kg)   SpO2 99%   BMI 25.13 kg/m  BP Readings from Last 3 Encounters:   05/14/24 130/76  05/14/24 (!) 127/58  04/30/24 123/79   Wt Readings from Last 3 Encounters:  05/14/24 170 lb 3.2 oz (77.2 kg)  04/30/24 171 lb 3.2 oz (77.7 kg)  04/23/24 172 lb 3.2 oz (78.1 kg)      Physical Exam Vitals reviewed.  Constitutional:      General: He is not in acute distress.    Appearance: He is not ill-appearing.  Cardiovascular:     Rate and Rhythm: Normal rate and regular rhythm.  Skin:    Comments: Right foot reveals small approximately 3 to 3 mm superficial ulcer surrounded by some callus tissue.  No drainage.  No surrounding erythema.  Neurological:     Mental Status: He is alert.      No results found for any visits on 05/14/24.    The 10-year ASCVD risk score (Arnett DK, et al., 2019) is: 6.3%    Assessment & Plan:   Problem List Items Addressed This Visit   None Visit Diagnoses       Ulcer of right foot, unspecified ulcer stage (HCC)    -  Primary   Relevant Orders   Ambulatory referral to Podiatry     Patient has small ulceration related to recent callus right lateral foot.  We trim down callused edge of  ulcer again today with #15 blade.  Patient will continue to keep clean with soap and water.  Continue good comfortable shoe wear and avoid narrow tight fitting shoes.  Set up podiatry referral to evaluate this and also tender hyperkeratotic area left third toe  He has no history of diabetes or peripheral vascular disease.  No follow-ups on file.    Glean Lamy, MD
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
## 2024-06-04 ENCOUNTER — Ambulatory Visit (INDEPENDENT_AMBULATORY_CARE_PROVIDER_SITE_OTHER): Admitting: Podiatry

## 2024-06-04 DIAGNOSIS — B070 Plantar wart: Secondary | ICD-10-CM | POA: Diagnosis not present

## 2024-06-04 NOTE — Progress Notes (Unsigned)
 Subjective:  Patient ID: Joseph Lindsey, male    DOB: 1971-04-18,  MRN: [id number]  Chief Complaint  Patient presents with   Wound Check    Pt stated that he has these places on his feet that started out like a callus but then they turned into sores     53 y.o. male presents with the above complaint.  Patient presents with concern of right sore opening up to the lateral foot.  Patient states that it just came out of nowhere was a callus.  Then it turned into sore wanted to get it evaluated has not seen anyone as part of same he is not diabetic.  Denies any other acute issues.  He has never had a wart before.   Review of Systems: Negative except as noted in the HPI. Denies N/V/F/Ch.  Past Medical History:  Diagnosis Date   Arthritis    Asthma    Fissure, anal    2003   Hyperlipidemia    Migraines    Primary thrombocytopenia, unspecified    Thyroid  disease    hypothyroid    Current Outpatient Medications:    fexofenadine  (ALLEGRA) 180 MG tablet, Take 180 mg by mouth daily., Disp: , Rfl:    levothyroxine  (SYNTHROID ) 100 MCG tablet, TAKE 1 TABLET DAILY, Disp: 90 tablet, Rfl: 3   lidocaine  (LIDODERM ) 5 %, Place 1 patch onto the skin daily. Remove & Discard patch within 12 hours or as directed by MD, Disp: 5 patch, Rfl: 0   methocarbamol  (ROBAXIN ) 500 MG tablet, Take 1 tablet (500 mg total) by mouth every 8 (eight) hours as needed for muscle spasms., Disp: 30 tablet, Rfl: 0   Multiple Vitamin (MULTIVITAMIN) capsule, Take 1 capsule by mouth daily., Disp: , Rfl:    naproxen  (NAPROSYN ) 500 MG tablet, Take 1 tablet (500 mg total) by mouth 2 (two) times daily., Disp: 30 tablet, Rfl: 0   triamcinolone  cream (KENALOG ) 0.1 %, APPLY TOPICALLY TWICE A DAY AS NEEDED, Disp: 30 g, Rfl: 2  Current Facility-Administered Medications:    0.9 %  sodium chloride  infusion, 500 mL, Intravenous, Once, Pyrtle, Gordy HERO, MD  Social History   Tobacco Use  Smoking Status Never  Smokeless Tobacco Never     Allergies  Allergen Reactions   Soybean-Containing Drug Products     Sinus issues and throat irritation    Objective:  There were no vitals filed for this visit. There is no height or weight on file to calculate BMI. Constitutional Well developed. Well nourished.  Vascular Dorsalis pedis pulses palpable bilaterally. Posterior tibial pulses palpable bilaterally. Capillary refill normal to all digits.  No cyanosis or clubbing noted. Pedal hair growth normal.  Neurologic Normal speech. Oriented to person, place, and time. Epicritic sensation to light touch grossly present bilaterally.  Dermatologic Right lateral foot with plantar verruca noted send pinpoint bleeding noted.  Hypergranulation tissue noted  Orthopedic: Normal joint ROM without pain or crepitus bilaterally. No visible deformities. No bony tenderness.   Radiographs: None Assessment:  No diagnosis found. Plan:  Patient was evaluated and treated and all questions answered.  Right lateral foot plantar verruca - All questions and concerns were discussed with the patient in extensive detail  --Lesion was debrided today without complications. Hemostasis was achieved and the area was cleaned. Cantharone was applied followed by an occlusive bandage. Post procedure complications were discussed. Monitor for signs or symptoms of infection and directed to call the office mainly should any occur.   No follow-ups on  file.

## 2024-06-18 ENCOUNTER — Ambulatory Visit (INDEPENDENT_AMBULATORY_CARE_PROVIDER_SITE_OTHER): Admitting: Podiatry

## 2024-06-18 DIAGNOSIS — B070 Plantar wart: Secondary | ICD-10-CM | POA: Diagnosis not present

## 2024-06-18 NOTE — Progress Notes (Signed)
  Subjective:  Patient ID: Joseph Lindsey, male    DOB: Sep 24, 1971,  MRN: [id number]  Chief Complaint  Patient presents with   Plantar Warts    53 y.o. male presents with the above complaint.  Patient presents with concern of right sore opening up to the lateral foot.  Patient states that it just came out of nowhere was a callus.  Then it turned into sore wanted to get it evaluated has not seen anyone as part of same he is not diabetic.  Denies any other acute issues.  He has never had a wart before.   Review of Systems: Negative except as noted in the HPI. Denies N/V/F/Ch.  Past Medical History:  Diagnosis Date   Arthritis    Asthma    Fissure, anal    2003   Hyperlipidemia    Migraines    Primary thrombocytopenia, unspecified    Thyroid  disease    hypothyroid    Current Outpatient Medications:    fexofenadine  (ALLEGRA) 180 MG tablet, Take 180 mg by mouth daily., Disp: , Rfl:    levothyroxine  (SYNTHROID ) 100 MCG tablet, TAKE 1 TABLET DAILY, Disp: 90 tablet, Rfl: 3   lidocaine  (LIDODERM ) 5 %, Place 1 patch onto the skin daily. Remove & Discard patch within 12 hours or as directed by MD, Disp: 5 patch, Rfl: 0   methocarbamol  (ROBAXIN ) 500 MG tablet, Take 1 tablet (500 mg total) by mouth every 8 (eight) hours as needed for muscle spasms., Disp: 30 tablet, Rfl: 0   Multiple Vitamin (MULTIVITAMIN) capsule, Take 1 capsule by mouth daily., Disp: , Rfl:    naproxen  (NAPROSYN ) 500 MG tablet, Take 1 tablet (500 mg total) by mouth 2 (two) times daily., Disp: 30 tablet, Rfl: 0   triamcinolone  cream (KENALOG ) 0.1 %, APPLY TOPICALLY TWICE A DAY AS NEEDED, Disp: 30 g, Rfl: 2  Current Facility-Administered Medications:    0.9 %  sodium chloride  infusion, 500 mL, Intravenous, Once, Pyrtle, Gordy HERO, MD  Social History   Tobacco Use  Smoking Status Never  Smokeless Tobacco Never    Allergies  Allergen Reactions   Soybean-Containing Drug Products     Sinus issues and throat irritation     Objective:  There were no vitals filed for this visit. There is no height or weight on file to calculate BMI. Constitutional Well developed. Well nourished.  Vascular Dorsalis pedis pulses palpable bilaterally. Posterior tibial pulses palpable bilaterally. Capillary refill normal to all digits.  No cyanosis or clubbing noted. Pedal hair growth normal.  Neurologic Normal speech. Oriented to person, place, and time. Epicritic sensation to light touch grossly present bilaterally.  Dermatologic Right lateral foot with plantar verruca noted send pinpoint bleeding noted.  Hypergranulation tissue noted  Orthopedic: Normal joint ROM without pain or crepitus bilaterally. No visible deformities. No bony tenderness.   Radiographs: None Assessment:   1. Plantar verruca    Plan:  Patient was evaluated and treated and all questions answered.  Right lateral foot plantar verruca~ second application  - All questions and concerns were discussed with the patient in extensive detail  --Lesion was debrided today without complications. Hemostasis was achieved and the area was cleaned. Cantharone was applied followed by an occlusive bandage. Post procedure complications were discussed. Monitor for signs or symptoms of infection and directed to call the office mainly should any occur.   No follow-ups on file.

## 2024-06-21 ENCOUNTER — Ambulatory Visit (INDEPENDENT_AMBULATORY_CARE_PROVIDER_SITE_OTHER): Admitting: Podiatry

## 2024-06-21 ENCOUNTER — Encounter: Payer: Self-pay | Admitting: Podiatry

## 2024-06-21 ENCOUNTER — Ambulatory Visit (INDEPENDENT_AMBULATORY_CARE_PROVIDER_SITE_OTHER)

## 2024-06-21 VITALS — BP 130/84 | HR 64 | Temp 98.0°F | Resp 18 | Ht 69.0 in | Wt 170.0 lb

## 2024-06-21 DIAGNOSIS — L97512 Non-pressure chronic ulcer of other part of right foot with fat layer exposed: Secondary | ICD-10-CM

## 2024-06-21 DIAGNOSIS — L03115 Cellulitis of right lower limb: Secondary | ICD-10-CM

## 2024-06-21 NOTE — Progress Notes (Unsigned)
 Subjective: Chief Complaint  Patient presents with   Wound Check    R foot ulcer infection with pus and swelling/pt seen by urgent care over weekend and they want him to f/u today 46/8.   53 year old male presents the office with above concerns.  He states that he originally developed this lesion on the side of his right foot after he had a pedicure and the area bled.  He initially saw his primary care doctor and then he followed up with Dr. Tobie.  For the skin lesion cancer was applied once the complication was on Friday.  On Saturday he started feeling very ill and on Sunday went to the urgent care was started on antibiotics, doxycyline.  He states that since then he does feel better today.  He does have pain to the lateral to the ankle laterally.  Currently does not report any fevers or chills.   Objective: AAO x3, NAD DP/PT pulses palpable bilaterally, CRT less than 3 seconds SPECT the right foot as pictured below is a wound measuring approximate 1.5 cm in diameter with hyperkeratotic periwound with some necrotic tissue present centrally.  He states that this was the size of the skin lesion that was present previously.  There is localized edema and erythema to the lateral aspect.  No pain with quadrant range of motion.  Not able to appreciate any areas of fluctuation or crepitation.  There is no drainable collection that I can identify today.  No pain into the leg. No pain with calf compression, swelling, warmth, erythema    Assessment: Right foot ulceration with cellulitis  Plan: -All treatment options discussed with the patient including all alternatives, risks, complications.  -X-rays obtained reviewed today.  Multiple views were obtained.  There is no definitive cortical changes suggest osteomyelitis.  There is no soft tissue emphysema. -Given the macerated tissue would switch to using Betadine dressing changes and I did apply this to help change the dressing. -Continue doxycycline.   He states that he already starts to feel better since starting these antibiotics. -Given the acute nature of the wound I ordered a stat MRI to rule out any underlying soft tissue pathology or early osteomyelitis. - Blood work ordered including CBC, sed rate, CRP - Elevation - Recommended to stay out of work until this is healed. - Discussed with him that there is any worsening if he starts develop any fevers or chills or any systemic signs of an action needs to report directly to the emergency room department. - Patient encouraged to call the office with any questions, concerns, change in symptoms.   Joseph Lindsey DPM

## 2024-06-22 DIAGNOSIS — L97512 Non-pressure chronic ulcer of other part of right foot with fat layer exposed: Secondary | ICD-10-CM | POA: Diagnosis not present

## 2024-06-22 DIAGNOSIS — L03115 Cellulitis of right lower limb: Secondary | ICD-10-CM | POA: Diagnosis not present

## 2024-06-23 ENCOUNTER — Encounter: Payer: Self-pay | Admitting: Podiatry

## 2024-06-23 LAB — CBC WITH DIFFERENTIAL/PLATELET
Basophils Absolute: 0 x10E3/uL (ref 0.0–0.2)
Basos: 1 %
EOS (ABSOLUTE): 0.1 x10E3/uL (ref 0.0–0.4)
Eos: 2 %
Hematocrit: 45.1 % (ref 37.5–51.0)
Hemoglobin: 14.1 g/dL (ref 13.0–17.7)
Immature Grans (Abs): 0 x10E3/uL (ref 0.0–0.1)
Immature Granulocytes: 0 %
Lymphocytes Absolute: 1.6 x10E3/uL (ref 0.7–3.1)
Lymphs: 32 %
MCH: 27.3 pg (ref 26.6–33.0)
MCHC: 31.3 g/dL — ABNORMAL LOW (ref 31.5–35.7)
MCV: 87 fL (ref 79–97)
Monocytes Absolute: 0.5 x10E3/uL (ref 0.1–0.9)
Monocytes: 10 %
Neutrophils Absolute: 2.7 x10E3/uL (ref 1.4–7.0)
Neutrophils: 55 %
Platelets: 50 x10E3/uL — CL (ref 150–450)
RBC: 5.16 x10E6/uL (ref 4.14–5.80)
RDW: 13.6 % (ref 11.6–15.4)
WBC: 4.9 x10E3/uL (ref 3.4–10.8)

## 2024-06-23 LAB — SEDIMENTATION RATE: Sed Rate: 27 mm/h (ref 0–30)

## 2024-06-23 LAB — C-REACTIVE PROTEIN: CRP: 7 mg/L (ref 0–10)

## 2024-07-30 ENCOUNTER — Telehealth: Payer: Self-pay | Admitting: Hematology and Oncology

## 2024-07-30 ENCOUNTER — Ambulatory Visit: Admitting: Podiatry

## 2024-07-30 DIAGNOSIS — B070 Plantar wart: Secondary | ICD-10-CM | POA: Diagnosis not present

## 2024-07-30 NOTE — Progress Notes (Signed)
  Subjective:  Patient ID: Joseph Lindsey, male    DOB: [birth year],  MRN: [id number]  Chief Complaint  Patient presents with   Plantar Warts    53 y.o. male presents with the above complaint.  Patient presents with concern of right sore opening up to the lateral foot.  Patient states that it just came out of nowhere was a callus.  Then it turned into sore wanted to get it evaluated has not seen anyone as part of same he is not diabetic.  Denies any other acute issues.  He has never had a wart before.   Review of Systems: Negative except as noted in the HPI. Denies N/V/F/Ch.  Past Medical History:  Diagnosis Date   Arthritis    Asthma    Fissure, anal    2003   Hyperlipidemia    Migraines    Primary thrombocytopenia, unspecified    Thyroid  disease    hypothyroid    Current Outpatient Medications:    fexofenadine  (ALLEGRA) 180 MG tablet, Take 180 mg by mouth daily., Disp: , Rfl:    levothyroxine  (SYNTHROID ) 100 MCG tablet, TAKE 1 TABLET DAILY, Disp: 90 tablet, Rfl: 3   lidocaine  (LIDODERM ) 5 %, Place 1 patch onto the skin daily. Remove & Discard patch within 12 hours or as directed by MD, Disp: 5 patch, Rfl: 0   methocarbamol  (ROBAXIN ) 500 MG tablet, Take 1 tablet (500 mg total) by mouth every 8 (eight) hours as needed for muscle spasms., Disp: 30 tablet, Rfl: 0   Multiple Vitamin (MULTIVITAMIN) capsule, Take 1 capsule by mouth daily., Disp: , Rfl:    naproxen  (NAPROSYN ) 500 MG tablet, Take 1 tablet (500 mg total) by mouth 2 (two) times daily., Disp: 30 tablet, Rfl: 0   triamcinolone  cream (KENALOG ) 0.1 %, APPLY TOPICALLY TWICE A DAY AS NEEDED, Disp: 30 g, Rfl: 2  Current Facility-Administered Medications:    0.9 %  sodium chloride  infusion, 500 mL, Intravenous, Once, Pyrtle, Gordy HERO, MD  Social History   Tobacco Use  Smoking Status Never  Smokeless Tobacco Never    Allergies  Allergen Reactions   Soybean-Containing Drug Products     Sinus issues and throat irritation     Objective:  There were no vitals filed for this visit. There is no height or weight on file to calculate BMI. Constitutional Well developed. Well nourished.  Vascular Dorsalis pedis pulses palpable bilaterally. Posterior tibial pulses palpable bilaterally. Capillary refill normal to all digits.  No cyanosis or clubbing noted. Pedal hair growth normal.  Neurologic Normal speech. Oriented to person, place, and time. Epicritic sensation to light touch grossly present bilaterally.  Dermatologic R no further plantar verruca noted no further granulation tissue noted  Orthopedic: Normal joint ROM without pain or crepitus bilaterally. No visible deformities. No bony tenderness.   Radiographs: None Assessment:   1. Plantar verruca     Plan:  Patient was evaluated and treated and all questions answered.  Right lateral foot plantar verruca~ second application  - All questions and concerns were discussed with the patient in extensive detail  -Clinically healed no further lesion noted no further ulceration noted.  At this time I discussed with the patient prevention techniques shoe gear modification if any foot and ankle issues are future she will come back and see us .  No follow-ups on file.

## 2024-07-30 NOTE — Telephone Encounter (Signed)
 Joseph Lindsey has been notified of his re-scheduled appointment.

## 2024-10-01 ENCOUNTER — Other Ambulatory Visit: Payer: Self-pay | Admitting: Family Medicine

## 2024-11-05 ENCOUNTER — Other Ambulatory Visit

## 2024-11-05 ENCOUNTER — Ambulatory Visit: Admitting: Hematology and Oncology

## 2024-11-07 ENCOUNTER — Other Ambulatory Visit: Payer: Self-pay | Admitting: Hematology and Oncology

## 2024-11-07 DIAGNOSIS — Z862 Personal history of diseases of the blood and blood-forming organs and certain disorders involving the immune mechanism: Secondary | ICD-10-CM

## 2024-11-07 NOTE — Progress Notes (Deleted)
 [facility] Telephone:([phone]   [phone]  PROGRESS NOTE  Patient Care Team: Micheal Wolm ORN, MD as PCP - General  Hematological/Oncological History # ITP  Interval History:  Joseph Lindsey 53 y.o. male with medical history significant for chronic ITP who presents for a follow up visit. The patient's last visit was on 04/30/2024. In the interim since the last visit he has had no major changes in his health.   On exam today [name] reports ***   MEDICAL HISTORY:  Past Medical History:  Diagnosis Date   Arthritis    Asthma    Fissure, anal    2003   Hyperlipidemia    Migraines    Primary thrombocytopenia, unspecified    Thyroid  disease    hypothyroid    SURGICAL HISTORY: Past Surgical History:  Procedure Laterality Date   BREATH TEK H PYLORI N/A 11/11/2013   Procedure: BREATH TEK VEAR LORA;  Surgeon: Gordy CHRISTELLA Starch, MD;  Location: [facility];  Service: Gastroenterology;  Laterality: N/A;   COLONOSCOPY      SOCIAL HISTORY: Social History   Socioeconomic History   Marital status: Married    Spouse name: Not on file   Number of children: 1   Years of education: Not on file   Highest education level: Not on file  Occupational History    Employer: [employer]  Tobacco Use   Smoking status: Never   Smokeless tobacco: Never  Vaping Use   Vaping status: Never Used  Substance and Sexual Activity   Alcohol use: Yes    Comment: once a week   Drug use: No   Sexual activity: Not on file  Other Topics Concern   Not on file  Social History Narrative   Not on file   Social Drivers of Health   Financial Resource Strain: Not on file  Food Insecurity: Not on file  Transportation Needs: Not on file  Physical Activity: Not on file  Stress: Not on file  Social Connections: Not on file  Intimate Partner Violence: Not on file    FAMILY HISTORY: Family History  Problem Relation Age of Onset   Hypertension Father     Diabetes Father    Asthma Father    Diabetes Sister    Asthma Sister    Asthma Brother    Cancer Brother 21       gastric cancer   Stomach cancer Brother    Sickle cell trait Other        mother   Colon cancer Neg Hx    Esophageal cancer Neg Hx    Rectal cancer Neg Hx    Colon polyps Neg Hx     ALLERGIES:  is allergic to soybean-containing drug products.  MEDICATIONS:  Current Outpatient Medications  Medication Sig Dispense Refill   fexofenadine  (ALLEGRA) 180 MG tablet Take 180 mg by mouth daily.     levothyroxine  (SYNTHROID ) 100 MCG tablet TAKE 1 TABLET DAILY 90 tablet 3   lidocaine  (LIDODERM ) 5 % Place 1 patch onto the skin daily. Remove & Discard patch within 12 hours or as directed by MD 5 patch 0   methocarbamol  (ROBAXIN ) 500 MG tablet Take 1 tablet (500 mg total) by mouth every 8 (eight) hours as needed for muscle spasms. 30 tablet 0   Multiple Vitamin (MULTIVITAMIN) capsule Take 1 capsule by mouth daily.     naproxen  (NAPROSYN ) 500 MG tablet Take 1 tablet (500 mg total) by mouth 2 (two) times  daily. 30 tablet 0   triamcinolone  cream (KENALOG ) 0.1 % APPLY TO AFFECTED AREA TWICE A DAY AS NEEDED 30 g 2   Current Facility-Administered Medications  Medication Dose Route Frequency Provider Last Rate Last Admin   0.9 %  sodium chloride  infusion  500 mL Intravenous Once Pyrtle, Gordy HERO, MD        REVIEW OF SYSTEMS:   Constitutional: ( - ) fevers, ( - )  chills , ( - ) night sweats Eyes: ( - ) blurriness of vision, ( - ) double vision, ( - ) watery eyes Ears, nose, mouth, throat, and face: ( - ) mucositis, ( - ) sore throat Respiratory: ( - ) cough, ( - ) dyspnea, ( - ) wheezes Cardiovascular: ( - ) palpitation, ( - ) chest discomfort, ( - ) lower extremity swelling Gastrointestinal:  ( - ) nausea, ( - ) heartburn, ( - ) change in bowel habits Skin: ( - ) abnormal skin rashes Lymphatics: ( - ) new lymphadenopathy, ( - ) easy bruising Neurological: ( - ) numbness, ( - )  tingling, ( - ) new weaknesses Behavioral/Psych: ( - ) mood change, ( - ) new changes  All other systems were reviewed with the patient and are negative.  PHYSICAL EXAMINATION:  There were no vitals filed for this visit.  There were no vitals filed for this visit.   GENERAL: Well-appearing middle-age African-American male, alert, no distress and comfortable SKIN: skin color, texture, turgor are normal, no rashes or significant lesions EYES: conjunctiva are pink and non-injected, sclera clear LUNGS: clear to auscultation and percussion with normal breathing effort HEART: regular rate & rhythm and no murmurs and no lower extremity edema Musculoskeletal: no cyanosis of digits and no clubbing  PSYCH: alert & oriented x 3, fluent speech NEURO: no focal motor/sensory deficits  LABORATORY DATA:  I have reviewed the data as listed    Latest Ref Rng & Units 06/22/2024    8:53 AM 04/30/2024    8:25 AM 10/31/2023   10:30 AM  CBC  WBC 3.4 - 10.8 x10E3/uL 4.9  5.4  5.4   Hemoglobin 13.0 - 17.7 g/dL 85.8  84.2  84.7   Hematocrit 37.5 - 51.0 % 45.1  46.8  46.4   Platelets 150 - 450 x10E3/uL 50  69  64        Latest Ref Rng & Units 04/30/2024    8:25 AM 11/04/2023   11:51 AM 10/29/2022    9:22 AM  CMP  Glucose 70 - 99 mg/dL 81  79  67   BUN 6 - 20 mg/dL 11  16  11    Creatinine 0.61 - 1.24 mg/dL 8.79  8.94  8.93   Sodium 135 - 145 mmol/L 138  135  138   Potassium 3.5 - 5.1 mmol/L 4.2  3.9  3.9   Chloride 98 - 111 mmol/L 103  100  102   CO2 22 - 32 mmol/L 29  27  29    Calcium 8.9 - 10.3 mg/dL 9.9  9.6  9.9   Total Protein 6.5 - 8.1 g/dL 7.6  7.1  7.7   Total Bilirubin 0.0 - 1.2 mg/dL 0.4  0.6  0.5   Alkaline Phos 38 - 126 U/L 68  52  62   AST 15 - 41 U/L 26  30  29    ALT 0 - 44 U/L 23  26  22      No results found for: MPROTEIN No results  found for: KPAFRELGTCHN, LAMBDASER, KAPLAMBRATIO   ASSESSMENT & PLAN [name] is a 53 y.o. male who presents to the clinic  for continued management of ITP.    #ITP: --Diagnosed in July 2011 and managed up to date by Dr. Amadeo. He transferred care to Dr. Federico --Baseline platelet counts are between 50-60K and has not required any intervention.  --Prior workup includes CT abdomen from 06/22/2013 showed no acute abnormality including remarkable liver and spleen.  --Lab today show WBC *** --Continue on observation.  --consider dex 40 mg PO daily x 4 days if Plt were to drop <30  --RTC in 6 months with repeat labs.     No orders of the defined types were placed in this encounter.   All questions were answered. The patient knows to call the clinic with any problems, questions or concerns.  A total of more than 25 minutes were spent on this encounter with face-to-face time and non-face-to-face time, including preparing to see the patient, ordering tests and/or medications, counseling the patient and coordination of care as outlined above.   Norleen IVAR Federico, MD Department of Hematology/Oncology [facility] Phone: [phone] Pager: [phone] Email: [email or web address]  11/07/2024 11:29 AM

## 2024-11-08 ENCOUNTER — Inpatient Hospital Stay: Attending: Family Medicine

## 2024-11-08 ENCOUNTER — Inpatient Hospital Stay: Admitting: Hematology and Oncology

## 2024-11-17 ENCOUNTER — Ambulatory Visit: Admitting: Family Medicine

## 2024-11-17 ENCOUNTER — Encounter: Payer: Self-pay | Admitting: Family Medicine

## 2024-11-17 ENCOUNTER — Ambulatory Visit: Payer: Self-pay | Admitting: Family Medicine

## 2024-11-17 VITALS — BP 116/74 | HR 63 | Temp 98.2°F | Ht 69.69 in | Wt 172.6 lb

## 2024-11-17 DIAGNOSIS — E039 Hypothyroidism, unspecified: Secondary | ICD-10-CM | POA: Diagnosis not present

## 2024-11-17 DIAGNOSIS — Z0000 Encounter for general adult medical examination without abnormal findings: Secondary | ICD-10-CM

## 2024-11-17 LAB — BASIC METABOLIC PANEL WITH GFR
BUN: 11 mg/dL (ref 6–23)
CO2: 29 meq/L (ref 19–32)
Calcium: 9.8 mg/dL (ref 8.4–10.5)
Chloride: 102 meq/L (ref 96–112)
Creatinine, Ser: 1 mg/dL (ref 0.40–1.50)
GFR: 86.46 mL/min (ref >60.00)
Glucose, Bld: 80 mg/dL (ref 70–99)
Potassium: 4.4 meq/L (ref 3.5–5.1)
Sodium: 138 meq/L (ref 135–145)

## 2024-11-17 LAB — CBC WITH DIFFERENTIAL/PLATELET
Basophils Absolute: 0 K/uL (ref 0.0–0.1)
Basophils Relative: 0.7 % (ref 0.0–3.0)
Eosinophils Absolute: 0.1 K/uL (ref 0.0–0.7)
Eosinophils Relative: 2 % (ref 0.0–5.0)
HCT: 46.3 % (ref 39.0–52.0)
Hemoglobin: 15.5 g/dL (ref 13.0–17.0)
Lymphocytes Relative: 46.9 % — ABNORMAL HIGH (ref 12.0–46.0)
Lymphs Abs: 2.1 K/uL (ref 0.7–4.0)
MCHC: 33.5 g/dL (ref 30.0–36.0)
MCV: 82.7 fl (ref 78.0–100.0)
Monocytes Absolute: 0.5 K/uL (ref 0.1–1.0)
Monocytes Relative: 10.9 % (ref 3.0–12.0)
Neutro Abs: 1.8 K/uL (ref 1.4–7.7)
Neutrophils Relative %: 39.5 % — ABNORMAL LOW (ref 43.0–77.0)
Platelets: 60 K/uL — ABNORMAL LOW (ref 150.0–400.0)
RBC: 5.59 Mil/uL (ref 4.22–5.81)
RDW: 14.6 % (ref 11.5–15.5)
WBC: 4.5 K/uL (ref 4.0–10.5)

## 2024-11-17 LAB — LIPID PANEL
Cholesterol: 233 mg/dL — ABNORMAL HIGH (ref 0–200)
HDL: 50.9 mg/dL (ref >39.00)
LDL Cholesterol: 169 mg/dL — ABNORMAL HIGH (ref 0–99)
NonHDL: 181.86
Total CHOL/HDL Ratio: 5
Triglycerides: 66 mg/dL (ref 0.0–149.0)
VLDL: 13.2 mg/dL (ref 0.0–40.0)

## 2024-11-17 LAB — PSA: PSA: 0.41 ng/mL (ref 0.10–4.00)

## 2024-11-17 LAB — HEPATIC FUNCTION PANEL
ALT: 27 U/L (ref 0–53)
AST: 29 U/L (ref 0–37)
Albumin: 4.5 g/dL (ref 3.5–5.2)
Alkaline Phosphatase: 66 U/L (ref 39–117)
Bilirubin, Direct: 0.1 mg/dL (ref 0.0–0.3)
Total Bilirubin: 0.4 mg/dL (ref 0.2–1.2)
Total Protein: 7.2 g/dL (ref 6.0–8.3)

## 2024-11-17 LAB — TSH: TSH: 3.03 u[IU]/mL (ref 0.35–5.50)

## 2024-11-17 LAB — HEMOGLOBIN A1C: Hgb A1c MFr Bld: 5.5 % (ref 4.6–6.5)

## 2024-11-17 NOTE — Progress Notes (Signed)
 Established Patient Office Visit  Subjective   Patient ID: Joseph Lindsey, male    DOB: Mar 02, 1971  Age: 53 y.o. MRN: [id number]  Chief Complaint  Patient presents with   Annual Exam    HPI   Thao is seen for physical exam.  He has history of hypothyroidism and is on replacement.  Chronic thrombocytopenia followed by hematology.  Generally doing well.  Still exercises with running 30 minutes at least 3 days/week.  Generally feels well other than some recent issues with low libido.  No excessive fatigue issues.  Health maintenance reviewed:  Health Maintenance  Topic Date Due   COVID-19 Vaccine (1) Never done   Hepatitis B Vaccines 19-59 Average Risk (1 of 3 - 19+ 3-dose series) Never done   Zoster Vaccines- Shingrix (1 of 2) Never done   Pneumococcal Vaccine: 50+ Years (1 of 1 - PCV) Never done   HIV Screening  10/29/2026 (Originally 04/04/1987)   DTaP/Tdap/Td (3 - Td or Tdap) 11/22/2030   Colonoscopy  01/15/2031   Hepatitis C Screening  Completed   HPV VACCINES  Aged Out   Meningococcal B Vaccine  Aged Out   Influenza Vaccine  Discontinued   - Declines pneumonia and Shingrix vaccines but will consider later -Declines influenza vaccine  Family history-father died age 10 complications of diabetes.  He had hypertension as well.  Mother has arthritis issues.  3 sisters with diabetes.  Brother with history of gastric cancer age 71.  Social history-married.  He has 2 children.  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  Leiland works for [employer]  Never smoked.  No regular alcohol.  Past Medical History:  Diagnosis Date   Arthritis    Asthma    Fissure, anal    2003   Hyperlipidemia    Migraines    Primary thrombocytopenia, unspecified    Thyroid  disease    hypothyroid   Past Surgical History:  Procedure Laterality Date   BREATH TEK H PYLORI N/A 11/11/2013   Procedure: BREATH TEK VEAR LORA;  Surgeon: Gordy CHRISTELLA Starch, MD;   Location: [facility];  Service: Gastroenterology;  Laterality: N/A;   COLONOSCOPY      reports that he has never smoked. He has never used smokeless tobacco. He reports current alcohol use. He reports that he does not use drugs. family history includes Asthma in his brother, father, and sister; Cancer (age of onset: 60) in his brother; Diabetes in his father and sister; Hypertension in his father; Sickle cell trait in an other family member; Stomach cancer in his brother. Allergies  Allergen Reactions   Soybean-Containing Drug Products     Sinus issues and throat irritation     Review of Systems  Constitutional:  Negative for chills, fever, malaise/fatigue and weight loss.  HENT:  Negative for hearing loss.   Eyes:  Negative for blurred vision and double vision.  Respiratory:  Negative for cough and shortness of breath.   Cardiovascular:  Negative for chest pain, palpitations and leg swelling.  Gastrointestinal:  Negative for abdominal pain, blood in stool, constipation and diarrhea.  Genitourinary:  Negative for dysuria.  Skin:  Negative for rash.  Neurological:  Negative for dizziness, speech change, seizures, loss of consciousness and headaches.  Psychiatric/Behavioral:  Negative for depression.       Objective:     BP 116/74   Pulse 63   Temp 98.2 F (36.8 C) (Oral)   Ht 5' 9.69 (1.77 m)  Wt 172 lb 9.6 oz (78.3 kg)   SpO2 99%   BMI 24.99 kg/m  BP Readings from Last 3 Encounters:  11/17/24 116/74  06/21/24 130/84  05/14/24 130/76   Wt Readings from Last 3 Encounters:  11/17/24 172 lb 9.6 oz (78.3 kg)  06/21/24 170 lb (77.1 kg)  05/14/24 170 lb 3.2 oz (77.2 kg)      Physical Exam Vitals reviewed.  Constitutional:      General: He is not in acute distress.    Appearance: He is well-developed. He is not ill-appearing.  HENT:     Head: Normocephalic and atraumatic.     Right Ear: External ear normal.     Left Ear: External ear normal.  Eyes:      Conjunctiva/sclera: Conjunctivae normal.     Pupils: Pupils are equal, round, and reactive to light.  Neck:     Thyroid : No thyromegaly.  Cardiovascular:     Rate and Rhythm: Normal rate and regular rhythm.     Heart sounds: Normal heart sounds. No murmur heard. Pulmonary:     Effort: No respiratory distress.     Breath sounds: No wheezing or rales.  Abdominal:     General: Bowel sounds are normal. There is no distension.     Palpations: Abdomen is soft. There is no mass.     Tenderness: There is no abdominal tenderness. There is no guarding or rebound.  Musculoskeletal:     Cervical back: Normal range of motion and neck supple.  Lymphadenopathy:     Cervical: No cervical adenopathy.  Skin:    Findings: No rash.  Neurological:     Mental Status: He is alert and oriented to person, place, and time.     Cranial Nerves: No cranial nerve deficit.     Deep Tendon Reflexes: Reflexes normal.      No results found for any visits on 11/17/24.    The 10-year ASCVD risk score (Arnett DK, et al., 2019) is: 5.1%    Assessment & Plan:   Problem List Items Addressed This Visit       Unprioritized   Hypothyroidism   Relevant Orders   TSH   Other Visit Diagnoses       Physical exam    -  Primary   Relevant Orders   Basic metabolic panel with GFR   Lipid panel   CBC with Differential/Platelet   Hepatic function panel   PSA   Hemoglobin A78c     53 year old male with history of chronic thrombocytopenia and hypothyroidism.  Very health-conscious.  Exercises regularly.  Strong family history of type 2 diabetes.  We discussed the following health maintenance items today  -Obtain follow-up labs.  Include A1c with strong family history of diabetes.  Also rechecking TSH with his history of hypothyroidism - Offered flu vaccine but he declines - Discussed Prevnar 20 and Shingrix and he will consider at some point later this year but declines today - Continue regular exercise  habits - Colonoscopy was done earlier this year and up-to-date - Rechecking PSA  No follow-ups on file.    Wolm Scarlet, MD

## 2024-11-30 ENCOUNTER — Other Ambulatory Visit: Payer: Self-pay | Admitting: Family Medicine

## 2024-12-15 ENCOUNTER — Ambulatory Visit (INDEPENDENT_AMBULATORY_CARE_PROVIDER_SITE_OTHER): Admitting: Family Medicine

## 2024-12-15 ENCOUNTER — Encounter: Payer: Self-pay | Admitting: Family Medicine

## 2024-12-15 VITALS — BP 128/70 | HR 60 | Temp 97.9°F | Wt 173.0 lb

## 2024-12-15 DIAGNOSIS — H66001 Acute suppurative otitis media without spontaneous rupture of ear drum, right ear: Secondary | ICD-10-CM

## 2024-12-15 DIAGNOSIS — J069 Acute upper respiratory infection, unspecified: Secondary | ICD-10-CM

## 2024-12-15 MED ORDER — AMOXICILLIN-POT CLAVULANATE 875-125 MG PO TABS: 1.0000 | ORAL_TABLET | Freq: Two times a day (BID) | ORAL | 0 refills | Status: AC

## 2024-12-15 NOTE — Progress Notes (Signed)
 "  Established Patient Office Visit  Subjective   Patient ID: Joseph Lindsey, male    DOB: [birth year]  Age: 54 y.o. MRN: [id number]  Chief Complaint  Patient presents with   Ear Pain   Cough    HPI    Nunzio is seen with onset about 6 days ago of nasal congestion, cough, low-grade fever, sore throat.  Fever resolved after couple days.  He had some initial myalgias.  At this point he has new symptom of right earache and right ear fullness past couple days.  He has some productive cough with thick yellow sputum.  No chronic lung issues.  Has been using NyQuil and DayQuil for cough.  No recurrent fever.  Past Medical History:  Diagnosis Date   Arthritis    Asthma    Fissure, anal    2003   Hyperlipidemia    Migraines    Primary thrombocytopenia, unspecified    Thyroid  disease    hypothyroid   Past Surgical History:  Procedure Laterality Date   BREATH TEK H PYLORI N/A 11/11/2013   Procedure: BREATH TEK VEAR LORA;  Surgeon: Gordy CHRISTELLA Starch, MD;  Location: [facility];  Service: Gastroenterology;  Laterality: N/A;   COLONOSCOPY      reports that he has never smoked. He has never used smokeless tobacco. He reports current alcohol use. He reports that he does not use drugs. family history includes Asthma in his brother, father, and sister; Cancer (age of onset: 66) in his brother; Diabetes in his father and sister; Hypertension in his father; Sickle cell trait in an other family member; Stomach cancer in his brother. Allergies[1]  Review of Systems  Constitutional:  Negative for chills, fever and malaise/fatigue.  HENT:  Positive for congestion and ear pain. Negative for ear discharge.   Eyes:  Negative for blurred vision.  Respiratory:  Positive for cough. Negative for shortness of breath.   Cardiovascular:  Negative for chest pain.  Neurological:  Negative for dizziness, weakness and headaches.      Objective:     BP 128/70   Pulse 60   Temp 97.9 F (36.6 C) (Oral)    Wt 173 lb (78.5 kg)   SpO2 95%   BMI 25.05 kg/m  BP Readings from Last 3 Encounters:  12/15/24 128/70  11/17/24 116/74  06/21/24 130/84   Wt Readings from Last 3 Encounters:  12/15/24 173 lb (78.5 kg)  11/17/24 172 lb 9.6 oz (78.3 kg)  06/21/24 170 lb (77.1 kg)      Physical Exam Vitals reviewed.  Constitutional:      General: He is not in acute distress.    Appearance: He is not ill-appearing.  HENT:     Ears:     Comments: Left eardrum appears slightly retracted but no other acute findings.  Right eardrum shows suppurative changes along the inferior portion of the eardrum.  Only mild erythema.  No evidence for perforation. Cardiovascular:     Rate and Rhythm: Normal rate and regular rhythm.  Pulmonary:     Effort: Pulmonary effort is normal.     Breath sounds: Normal breath sounds. No wheezing or rales.  Musculoskeletal:     Cervical back: Neck supple.  Neurological:     Mental Status: He is alert.      No results found for any visits on 12/15/24.    The 10-year ASCVD risk score (Arnett DK, et al., 2019) is: 6.1%    Assessment & Plan:   Acute  right suppurative otitis media in the setting of probable recent viral URI with cough.  Recommend Augmentin  875 mg twice daily for 10 days.  Continue NyQuil and DayQuil as needed.  Follow-up in 2 weeks if right ear symptoms not fully resolved.  Wolm Scarlet, MD     [1]  Allergies Allergen Reactions   Soybean-Containing Drug Products     Sinus issues and throat irritation    "

## 2024-12-24 ENCOUNTER — Inpatient Hospital Stay: Attending: Family Medicine

## 2024-12-24 ENCOUNTER — Inpatient Hospital Stay: Admitting: Hematology and Oncology

## 2024-12-24 VITALS — BP 130/70 | HR 60 | Temp 97.8°F | Resp 13 | Wt 174.9 lb

## 2024-12-24 DIAGNOSIS — Z862 Personal history of diseases of the blood and blood-forming organs and certain disorders involving the immune mechanism: Secondary | ICD-10-CM

## 2024-12-24 DIAGNOSIS — D696 Thrombocytopenia, unspecified: Secondary | ICD-10-CM

## 2024-12-24 LAB — CMP (CANCER CENTER ONLY)
ALT: 26 U/L (ref 0–44)
AST: 34 U/L (ref 15–41)
Albumin: 4.3 g/dL (ref 3.5–5.0)
Alkaline Phosphatase: 87 U/L (ref 38–126)
Anion gap: 10 (ref 5–15)
BUN: 12 mg/dL (ref 6–20)
CO2: 26 mmol/L (ref 22–32)
Calcium: 9.7 mg/dL (ref 8.9–10.3)
Chloride: 104 mmol/L (ref 98–111)
Creatinine: 1.25 mg/dL — ABNORMAL HIGH (ref 0.61–1.24)
GFR, Estimated: >60 mL/min
Glucose, Bld: 105 mg/dL — ABNORMAL HIGH (ref 70–99)
Potassium: 4.2 mmol/L (ref 3.5–5.1)
Sodium: 139 mmol/L (ref 135–145)
Total Bilirubin: 0.4 mg/dL (ref 0.0–1.2)
Total Protein: 7.4 g/dL (ref 6.5–8.1)

## 2024-12-24 LAB — CBC WITH DIFFERENTIAL (CANCER CENTER ONLY)
Abs Immature Granulocytes: 0.02 K/uL (ref 0.00–0.07)
Basophils Absolute: 0 K/uL (ref 0.0–0.1)
Basophils Relative: 1 %
Eosinophils Absolute: 0.2 K/uL (ref 0.0–0.5)
Eosinophils Relative: 3 %
HCT: 44.8 % (ref 39.0–52.0)
Hemoglobin: 15.2 g/dL (ref 13.0–17.0)
Immature Granulocytes: 0 %
Lymphocytes Relative: 35 %
Lymphs Abs: 2.3 K/uL (ref 0.7–4.0)
MCH: 27.6 pg (ref 26.0–34.0)
MCHC: 33.9 g/dL (ref 30.0–36.0)
MCV: 81.3 fL (ref 80.0–100.0)
Monocytes Absolute: 0.7 K/uL (ref 0.1–1.0)
Monocytes Relative: 10 %
Neutro Abs: 3.4 K/uL (ref 1.7–7.7)
Neutrophils Relative %: 51 %
Platelet Count: 79 K/uL — ABNORMAL LOW (ref 150–400)
RBC: 5.51 MIL/uL (ref 4.22–5.81)
RDW: 14 % (ref 11.5–15.5)
WBC Count: 6.6 K/uL (ref 4.0–10.5)
nRBC: 0 % (ref 0.0–0.2)

## 2024-12-24 NOTE — Progress Notes (Signed)
 " [facility] [phone]   [phone]  PROGRESS NOTE  Patient Care Team: Micheal Wolm ORN, MD as PCP - General  Hematological/Oncological History # ITP  Interval History:  Joseph Lindsey 54 y.o. male with medical history significant for chronic ITP who presents for a follow up visit. The patient's last visit was on 04/30/2024. In the interim since the last visit he has had no major changes in his health.   On exam today Joseph Lindsey reports reports he has been well overall in interim since our last visit.  He said no recent hospitalizations or ER visits but did recently have issues with an ear infection for which he was put on antibiotics.  He reports has been going on for about 2 weeks in his right ear.  He has also had some cough which he thinks is associated with the ear infection.  He notes with the antibiotic therapy his symptoms have been improving.  He is not having any runny nose or sore throat and denies any fevers, chills, sweats.  He also has not had any trouble with bleeding, bruising, or dark stools.  He has no upcoming dental work or surgeries.  He also reports he is not currently in any pain.  Overall he feels well and has no additional questions concerns or complaints today.  A full 10 point ROS is otherwise negative.  MEDICAL HISTORY:  Past Medical History:  Diagnosis Date   Arthritis    Asthma    Fissure, anal    2003   Hyperlipidemia    Migraines    Primary thrombocytopenia, unspecified    Thyroid  disease    hypothyroid    SURGICAL HISTORY: Past Surgical History:  Procedure Laterality Date   BREATH TEK H PYLORI N/A 11/11/2013   Procedure: BREATH TEK VEAR LORA;  Surgeon: Gordy CHRISTELLA Starch, MD;  Location: [facility];  Service: Gastroenterology;  Laterality: N/A;   COLONOSCOPY      SOCIAL HISTORY: Social History   Socioeconomic History   Marital status: Married    Spouse name: Not on file   Number of children: 1   Years  of education: Not on file   Highest education level: Not on file  Occupational History    Employer: [employer]  Tobacco Use   Smoking status: Never   Smokeless tobacco: Never  Vaping Use   Vaping status: Never Used  Substance and Sexual Activity   Alcohol use: Yes    Comment: once a week   Drug use: No   Sexual activity: Not on file  Other Topics Concern   Not on file  Social History Narrative   Not on file   Social Drivers of Health   Tobacco Use: Low Risk (12/15/2024)   Patient History    Smoking Tobacco Use: Never    Smokeless Tobacco Use: Never    Passive Exposure: Not on file  Financial Resource Strain: Not on file  Food Insecurity: Not on file  Transportation Needs: Not on file  Physical Activity: Not on file  Stress: Not on file  Social Connections: Not on file  Intimate Partner Violence: Not on file  Depression (EYV7-0): Low Risk (11/17/2024)   Depression (PHQ2-9)    PHQ-2 Score: 0  Alcohol Screen: Not on file  Housing: Not on file  Utilities: Not on file  Health Literacy: Not on file    FAMILY HISTORY: Family History  Problem Relation Age of Onset   Hypertension Father  Diabetes Father    Asthma Father    Diabetes Sister    Asthma Sister    Asthma Brother    Cancer Brother 25       gastric cancer   Stomach cancer Brother    Sickle cell trait Other        mother   Colon cancer Neg Hx    Esophageal cancer Neg Hx    Rectal cancer Neg Hx    Colon polyps Neg Hx     ALLERGIES:  is allergic to soybean-containing drug products.  MEDICATIONS:  Current Outpatient Medications  Medication Sig Dispense Refill   amoxicillin -clavulanate (AUGMENTIN ) 875-125 MG tablet Take 1 tablet by mouth 2 (two) times daily. 20 tablet 0   fexofenadine  (ALLEGRA) 180 MG tablet Take 180 mg by mouth daily.     levothyroxine  (SYNTHROID ) 100 MCG tablet TAKE 1 TABLET DAILY 90 tablet 3   lidocaine  (LIDODERM ) 5 % Place 1 patch onto the skin daily. Remove & Discard patch  within 12 hours or as directed by MD 5 patch 0   naproxen  (NAPROSYN ) 500 MG tablet Take 1 tablet (500 mg total) by mouth 2 (two) times daily. 30 tablet 0   triamcinolone  cream (KENALOG ) 0.1 % APPLY TO AFFECTED AREA TWICE A DAY AS NEEDED 30 g 2   methocarbamol  (ROBAXIN ) 500 MG tablet Take 1 tablet (500 mg total) by mouth every 8 (eight) hours as needed for muscle spasms. (Patient not taking: Reported on 12/24/2024) 30 tablet 0   Multiple Vitamin (MULTIVITAMIN) capsule Take 1 capsule by mouth daily. (Patient not taking: Reported on 12/24/2024)     Current Facility-Administered Medications  Medication Dose Route Frequency Provider Last Rate Last Admin   0.9 %  sodium chloride  infusion  500 mL Intravenous Once Pyrtle, Gordy HERO, MD        REVIEW OF SYSTEMS:   Constitutional: ( - ) fevers, ( - )  chills , ( - ) night sweats Eyes: ( - ) blurriness of vision, ( - ) double vision, ( - ) watery eyes Ears, nose, mouth, throat, and face: ( - ) mucositis, ( - ) sore throat Respiratory: ( - ) cough, ( - ) dyspnea, ( - ) wheezes Cardiovascular: ( - ) palpitation, ( - ) chest discomfort, ( - ) lower extremity swelling Gastrointestinal:  ( - ) nausea, ( - ) heartburn, ( - ) change in bowel habits Skin: ( - ) abnormal skin rashes Lymphatics: ( - ) new lymphadenopathy, ( - ) easy bruising Neurological: ( - ) numbness, ( - ) tingling, ( - ) new weaknesses Behavioral/Psych: ( - ) mood change, ( - ) new changes  All other systems were reviewed with the patient and are negative.  PHYSICAL EXAMINATION:  Vitals:   12/24/24 1329  BP: 130/70  Pulse: 60  Resp: 13  Temp: 97.8 F (36.6 C)  SpO2: 100%    Filed Weights   12/24/24 1329  Weight: 174 lb 14.4 oz (79.3 kg)     GENERAL: Well-appearing middle-age African-American male, alert, no distress and comfortable SKIN: skin color, texture, turgor are normal, no rashes or significant lesions EYES: conjunctiva are pink and non-injected, sclera clear LUNGS:  clear to auscultation and percussion with normal breathing effort HEART: regular rate & rhythm and no murmurs and no lower extremity edema Musculoskeletal: no cyanosis of digits and no clubbing  PSYCH: alert & oriented x 3, fluent speech NEURO: no focal motor/sensory deficits  LABORATORY DATA:  I have  reviewed the data as listed    Latest Ref Rng & Units 12/24/2024    1:07 PM 11/17/2024    9:47 AM 06/22/2024    8:53 AM  CBC  WBC 4.0 - 10.5 K/uL 6.6  4.5  4.9   Hemoglobin 13.0 - 17.0 g/dL 84.7  84.4  85.8   Hematocrit 39.0 - 52.0 % 44.8  46.3  45.1   Platelets 150 - 400 K/uL 79  60.0 Repeated and verified X2.  50        Latest Ref Rng & Units 12/24/2024    1:07 PM 11/17/2024    9:47 AM 04/30/2024    8:25 AM  CMP  Glucose 70 - 99 mg/dL 894  80  81   BUN 6 - 20 mg/dL 12  11  11    Creatinine 0.61 - 1.24 mg/dL 8.74  8.99  8.79   Sodium 135 - 145 mmol/L 139  138  138   Potassium 3.5 - 5.1 mmol/L 4.2  4.4  4.2   Chloride 98 - 111 mmol/L 104  102  103   CO2 22 - 32 mmol/L 26  29  29    Calcium 8.9 - 10.3 mg/dL 9.7  9.8  9.9   Total Protein 6.5 - 8.1 g/dL 7.4  7.2  7.6   Total Bilirubin 0.0 - 1.2 mg/dL 0.4  0.4  0.4   Alkaline Phos 38 - 126 U/L 87  66  68   AST 15 - 41 U/L 34  29  26   ALT 0 - 44 U/L 26  27  23      No results found for: MPROTEIN No results found for: KPAFRELGTCHN, LAMBDASER, KAPLAMBRATIO   ASSESSMENT & PLAN GERSHOM BROBECK is a 54 y.o. male who presents to the clinic for continued management of ITP.    #ITP: --Diagnosed in July 2011 and managed up to date by Dr. Amadeo. He transferred care to Dr. Federico --Baseline platelet counts are between 50-60K and has not required any intervention.  --Prior workup includes CT abdomen from 06/22/2013 showed no acute abnormality including remarkable liver and spleen.  --Lab today show WBC 6.6, Hgb 15.2, MCV 81.3, Plt 79  --Continue on observation.  --consider dex 40 mg PO daily x 4 days if Plt were to drop <30   --RTC in 6 months with repeat labs.    No orders of the defined types were placed in this encounter.  All questions were answered. The patient knows to call the clinic with any problems, questions or concerns.  A total of more than 25 minutes were spent on this encounter with face-to-face time and non-face-to-face time, including preparing to see the patient, ordering tests and/or medications, counseling the patient and coordination of care as outlined above.   Norleen IVAR Federico, MD Department of Hematology/Oncology [facility] Phone: [phone] Pager: [phone] Email: [email or web address]  12/26/2024 5:16 PM  "

## 2025-06-24 ENCOUNTER — Inpatient Hospital Stay: Admitting: Hematology and Oncology

## 2025-06-24 ENCOUNTER — Inpatient Hospital Stay
